# Patient Record
Sex: Female | Born: 1945 | Race: White | Hispanic: No | Marital: Married | Smoking: Former smoker
Health system: Southern US, Community
[De-identification: ages and names within clinical notes are randomized; demographics above are authoritative.]

## PROBLEM LIST (undated history)

## (undated) DIAGNOSIS — M130 Polyarthritis, unspecified: Secondary | ICD-10-CM

## (undated) DIAGNOSIS — I499 Cardiac arrhythmia, unspecified: Secondary | ICD-10-CM

## (undated) DIAGNOSIS — I4891 Unspecified atrial fibrillation: Secondary | ICD-10-CM

## (undated) DIAGNOSIS — J40 Bronchitis, not specified as acute or chronic: Secondary | ICD-10-CM

## (undated) DIAGNOSIS — D649 Anemia, unspecified: Secondary | ICD-10-CM

## (undated) DIAGNOSIS — I10 Essential (primary) hypertension: Secondary | ICD-10-CM

## (undated) DIAGNOSIS — R61 Generalized hyperhidrosis: Secondary | ICD-10-CM

## (undated) DIAGNOSIS — K759 Inflammatory liver disease, unspecified: Secondary | ICD-10-CM

## (undated) DIAGNOSIS — M353 Polymyalgia rheumatica: Secondary | ICD-10-CM

## (undated) DIAGNOSIS — R011 Cardiac murmur, unspecified: Secondary | ICD-10-CM

## (undated) DIAGNOSIS — R509 Fever, unspecified: Principal | ICD-10-CM

## (undated) DIAGNOSIS — E119 Type 2 diabetes mellitus without complications: Secondary | ICD-10-CM

## (undated) DIAGNOSIS — M3320 Polymyositis, organ involvement unspecified: Secondary | ICD-10-CM

## (undated) DIAGNOSIS — K219 Gastro-esophageal reflux disease without esophagitis: Secondary | ICD-10-CM

## (undated) DIAGNOSIS — E7800 Pure hypercholesterolemia, unspecified: Secondary | ICD-10-CM

## (undated) DIAGNOSIS — G4730 Sleep apnea, unspecified: Secondary | ICD-10-CM

## (undated) HISTORY — PX: ABDOMINAL HYSTERECTOMY: SHX81

## (undated) HISTORY — DX: Pure hypercholesterolemia, unspecified: E78.00

## (undated) HISTORY — DX: Inflammatory liver disease, unspecified: K75.9

## (undated) HISTORY — DX: Polymyositis, organ involvement unspecified: M33.20

## (undated) HISTORY — DX: Unspecified atrial fibrillation: I48.91

## (undated) HISTORY — DX: Essential (primary) hypertension: I10

## (undated) HISTORY — DX: Bronchitis, not specified as acute or chronic: J40

## (undated) HISTORY — DX: Generalized hyperhidrosis: R61

## (undated) HISTORY — DX: Polymyalgia rheumatica: M35.3

## (undated) HISTORY — PX: DG THUMB RIGHT HAND (ARMC HX): HXRAD1825

## (undated) HISTORY — DX: Fever, unspecified: R50.9

## (undated) HISTORY — DX: Polyarthritis, unspecified: M13.0

## (undated) HISTORY — DX: Type 2 diabetes mellitus without complications: E11.9

---

## 1970-09-23 HISTORY — PX: TUBAL LIGATION: SHX77

## 1971-09-24 HISTORY — PX: ABDOMINAL HYSTERECTOMY: SHX81

## 1971-09-24 HISTORY — PX: VAGINAL HYSTERECTOMY: SHX2639

## 1971-09-24 HISTORY — PX: APPENDECTOMY: SHX54

## 1999-09-24 HISTORY — PX: OVARY SURGERY: SHX727

## 1999-09-24 HISTORY — PX: OOPHORECTOMY: SHX86

## 2004-07-13 ENCOUNTER — Encounter: Admission: RE | Admit: 2004-07-13 | Discharge: 2004-07-13 | Payer: Self-pay | Admitting: Internal Medicine

## 2005-03-26 ENCOUNTER — Emergency Department (HOSPITAL_COMMUNITY): Admission: EM | Admit: 2005-03-26 | Discharge: 2005-03-26 | Payer: Self-pay | Admitting: Emergency Medicine

## 2005-07-22 ENCOUNTER — Encounter: Admission: RE | Admit: 2005-07-22 | Discharge: 2005-07-22 | Payer: Self-pay | Admitting: Internal Medicine

## 2005-09-23 HISTORY — PX: KNEE ARTHROPLASTY: SHX992

## 2005-09-23 HISTORY — PX: KNEE ARTHROSCOPY: SUR90

## 2006-04-24 ENCOUNTER — Ambulatory Visit (HOSPITAL_COMMUNITY): Admission: RE | Admit: 2006-04-24 | Discharge: 2006-04-24 | Payer: Self-pay | Admitting: Orthopedic Surgery

## 2006-05-28 ENCOUNTER — Ambulatory Visit (HOSPITAL_BASED_OUTPATIENT_CLINIC_OR_DEPARTMENT_OTHER): Admission: RE | Admit: 2006-05-28 | Discharge: 2006-05-28 | Payer: Self-pay | Admitting: Orthopedic Surgery

## 2006-07-24 ENCOUNTER — Encounter: Admission: RE | Admit: 2006-07-24 | Discharge: 2006-07-24 | Payer: Self-pay | Admitting: Internal Medicine

## 2007-08-26 ENCOUNTER — Encounter: Admission: RE | Admit: 2007-08-26 | Discharge: 2007-08-26 | Payer: Self-pay | Admitting: Internal Medicine

## 2007-09-14 ENCOUNTER — Ambulatory Visit (HOSPITAL_COMMUNITY): Admission: RE | Admit: 2007-09-14 | Discharge: 2007-09-14 | Payer: Self-pay | Admitting: Orthopedic Surgery

## 2007-09-24 HISTORY — PX: FOOT SURGERY: SHX648

## 2008-07-06 ENCOUNTER — Ambulatory Visit (HOSPITAL_BASED_OUTPATIENT_CLINIC_OR_DEPARTMENT_OTHER): Admission: RE | Admit: 2008-07-06 | Discharge: 2008-07-06 | Payer: Self-pay | Admitting: Orthopedic Surgery

## 2008-09-26 ENCOUNTER — Encounter: Admission: RE | Admit: 2008-09-26 | Discharge: 2008-09-26 | Payer: Self-pay | Admitting: Internal Medicine

## 2009-01-27 ENCOUNTER — Ambulatory Visit (HOSPITAL_COMMUNITY): Admission: RE | Admit: 2009-01-27 | Discharge: 2009-01-27 | Payer: Self-pay | Admitting: Gastroenterology

## 2009-10-16 ENCOUNTER — Encounter: Admission: RE | Admit: 2009-10-16 | Discharge: 2009-10-16 | Payer: Self-pay | Admitting: Internal Medicine

## 2009-11-13 DIAGNOSIS — K219 Gastro-esophageal reflux disease without esophagitis: Secondary | ICD-10-CM | POA: Insufficient documentation

## 2009-11-13 DIAGNOSIS — Z79899 Other long term (current) drug therapy: Secondary | ICD-10-CM | POA: Insufficient documentation

## 2009-11-13 DIAGNOSIS — E785 Hyperlipidemia, unspecified: Secondary | ICD-10-CM | POA: Insufficient documentation

## 2009-11-13 DIAGNOSIS — D126 Benign neoplasm of colon, unspecified: Secondary | ICD-10-CM | POA: Insufficient documentation

## 2009-12-13 DIAGNOSIS — M8580 Other specified disorders of bone density and structure, unspecified site: Secondary | ICD-10-CM | POA: Insufficient documentation

## 2010-07-10 DIAGNOSIS — T6391XA Toxic effect of contact with unspecified venomous animal, accidental (unintentional), initial encounter: Secondary | ICD-10-CM | POA: Insufficient documentation

## 2010-10-25 ENCOUNTER — Other Ambulatory Visit: Payer: Self-pay | Admitting: Internal Medicine

## 2010-10-25 DIAGNOSIS — Z1239 Encounter for other screening for malignant neoplasm of breast: Secondary | ICD-10-CM

## 2010-11-26 ENCOUNTER — Ambulatory Visit: Payer: Self-pay

## 2010-12-03 ENCOUNTER — Ambulatory Visit
Admission: RE | Admit: 2010-12-03 | Discharge: 2010-12-03 | Disposition: A | Discharge status: Home / Self Care | Payer: Medicare Other | Source: Ambulatory Visit | Attending: Internal Medicine | Admitting: Internal Medicine

## 2010-12-03 DIAGNOSIS — Z1239 Encounter for other screening for malignant neoplasm of breast: Secondary | ICD-10-CM

## 2010-12-17 DIAGNOSIS — N951 Menopausal and female climacteric states: Secondary | ICD-10-CM | POA: Insufficient documentation

## 2011-02-05 NOTE — Op Note (Signed)
[name], [name]              ACCOUNT NO.:  [id number]   MEDICAL RECORD NO.:  [id number]          PATIENT TYPE:  AMB   LOCATION:  ENDO                         FACILITY:  [facility]   PHYSICIAN:  Anselmo Rod, M.D.  DATE OF BIRTH:  1946/09/22   DATE OF PROCEDURE:  01/27/2009  DATE OF DISCHARGE:                               OPERATIVE REPORT   PROCEDURE PERFORMED:  Screening colonoscopy.   ENDOSCOPIST:  Anselmo Rod, M.D.   INSTRUMENT USED:  Pentax video colonoscope.   INDICATIONS FOR PROCEDURE:  A 65 year old white female undergoing a  screening colonoscopy.  The patient has a history of colonic polyps  removed in the past.   PREPROCEDURE PREPARATION:  Informed consent was procured from the  patient and the patient had fasted for 4 hours prior to the procedure  and prepped with MoviPrep the night of the procedure and the morning of  the procedure.  She was fasted for 4 hours prior to the procedure.  The  risks of the procedure including bleeding, perforation, 10% miss rate of  cancer and polyps, etc., were discussed with her in great detail.   PREPROCEDURE PHYSICAL:  Patient with stable vital signs.  Neck supple.  Chest clear to auscultation.  S1, S2 regular.  Abdomen soft with normal  bowel sounds.   DESCRIPTION OF PROCEDURE:  The patient was placed in the left lateral  decubitus position, sedated with 125 mcg of Fentanyl and 10 mf of Versed  given intravenously in slow incremental doses.  Once the patient was  adequately sedated and maintained on low-flow oxygen and continuous  cardiac monitoring, the Pentax video colonoscope was advanced from the  rectum to the cecum.  The appendiceal orifice and cecal valve were  carefully visualized, and photographed.  No masses, polyps, erosions,  ulcerations, or diverticula were noted.  Retroflexion in the rectum  showed no abnormality.  The terminal ileum appeared healthy and without  lesions.   IMPRESSION:  Normal colonoscopy  of the terminal ileum.  No masses,  polyps, erosions, ulcerations, or diverticula noted.   RECOMMENDATIONS:  1. Continue a high-fiber diet, vegetables and fruit.  2. Repeat colonoscopy in the next 5 years.  3. If the patient has abnormal symptoms in the interim, she is to      contact the office immediately for further recommendations.      Anselmo Rod, M.D.  Electronically Signed     JNM/MEDQ  D:  01/27/2009  T:  01/27/2009  Job:  [id number]   cc:   Larina Earthly, M.D.  Fax: [phone]

## 2011-02-05 NOTE — Op Note (Signed)
[name] [name], [name] [name]              ACCOUNT NO.:  [id number]   MEDICAL RECORD NO.:  [id number]          PATIENT TYPE:  AMB   LOCATION:  DSC                          FACILITY:  [facility]   PHYSICIAN:  Leonides Grills, M.D.     DATE OF BIRTH:  Dec 15, 1945   DATE OF PROCEDURE:  07/06/2008  DATE OF DISCHARGE:                               OPERATIVE REPORT   PREOPERATIVE DIAGNOSES:  1. Left second metatarsalgia.  2. Left second hammertoe.   POSTOPERATIVE DIAGNOSES:  1. Left second metatarsalgia.  2. Left second hammertoe.   OPERATION:  1. Left second Weil metatarsal shortening osteotomy.  2. Stress x-rays, left foot.  3. Left second toe metatarsophalangeal joint dorsal capsulotomy      collateral release.  4. Left second toe proximal phalanx head resection.  5. Left second toe flexor pollicis longus to proximal phalanx tendon      transfer.  6. Left second toe extensor digitorum brevis to extensor digitorum      longus tendon transfer.   ANESTHESIA:  General.   SURGEON:  Leonides Grills, MD   ASSISTANT:  Richardean Canal, PA-C   ESTIMATED BLOOD LOSS:  Minimal.   TOURNIQUET TIME:  Approximately 45 minutes.   COMPLICATIONS:  None.   DISPOSITION:  Stable to PR.   INDICATIONS:  This is a 65 year old female who has had longstanding left  forefoot pain that was interfering with her life due to the above  pathology.  She was consented to the above procedure.  All risks of  infection, neurovascular injury, nonunion, malunion, hardware rotation,  hardware failure, persistent pain, worsening pain, prolonged recovery,  cock-up toe deformity, and recurrence of hammertoe were all explained.  Questions were encouraged and answered.   OPERATION:  The patient was brought to the operating room and placed in  the supine position after adequate general endotracheal tube anesthesia  was administered as well as Ancef 1 g IV piggyback.  Left lower  extremity was then prepped and draped in the sterile  manner over  proximally placed thigh tourniquet.  Limb was gravity exsanguinated and  tourniquet was elevated to 290 mmHg.  A longitudinal incision over the  dorsal aspect of left second toe was then made.  Dissection was carried  down through the skin.  Hemostasis was obtained.  EDB and EDL tendons  were identified and EDL was tenotomized proximally and medially in the  brevis distal lateral and retracted of harm's way for later transfer.  An MTP joint dorsal capsulotomy with collateral release was then  performed with a 15 blade scalpel protecting the soft tissues both  medially and laterally.  This had an outstanding release.  The distal  aspect of the proximal phalanx was then skeletonized and the head was  then removed with rongeur followed by a bone cutter.  This cut was made  perpendicular to long axis of the proximal phalanx.  A longitudinal  incision was then made into the plantar plate of the PIP joint.  FPL  tendon was then identified and was tenotomized as distal as possible.  This was carefully dissected out through the  flexor digitorum brevis  tendons and a slit was made so that 2 separate limbs could be used in  the FPL to proximal phalanx tendon transfer around the shaft of the  proximal phalanx.  We then plantarflexed the second toe.  Then with  stacked sagittal saw blades, a Weil metatarsal area osteotomy was then  made parallel to the plantar aspect of the foot.  The head was shortened  about 3-4 mm and then fixed with a 1.5-mm fully threaded cortical set  screw using a 1.1-mm drill hole respectively.  This had excellent  purchase and maintenance of the correction.  This was countersunk.  Bone  ledge dorsally was trimmed off with a rongeur.  Bone wax applied to  expose bony surfaces after the joint was copiously with normal saline.  Once the bone wax was applied, this was copiously irrigated once again.  We then transferred holding the toe down the plantarflexed  position,  transferred the FPL to proximal phalanx and reconstructed this with 3-0  PDS stitch.  This had outstanding repair.  We then placed a 0.054 K-wire  antegrade through the middle and distal phalanx, reduced PIP joint and  fired this retrograde into the proximal phalanx.  With the toe held in  reduced position, we then fired this across the MTP joint.  This held  the toe in excellent position.  We then obtained stress x-ray in AP and  lateral planes, showed no gross motion, fixation of proper position, and  excellent alignment as well.  We had the toe reduced in the exact plane  of the metatarsal.  There was a subtle hallux interphalangeus and this  was not addressed before.  It was not symptomatic.  The EDB to EDL  tendon was then transferred using a 3-0 PDS stitch and this was sewn to  the stump of the FPL tendon as well, reconstructing the extensor  expansion unit.  The area was copiously irrigated with normal saline.  The tourniquet was deflated.  Hemostasis was obtained.  Toe pinked up  nicely.  Skin relieving incision was made on the either side of the K-  wire.  K-wire was bent, cut and capped.  The area was copiously  irrigated with normal saline.  Subcu was closed with 3-0 PDS.  Skin was  closed with 4-0 nylon.  Sterile dressing was applied.  Hard sole shoe  was applied.  The patient stable to PR.      Leonides Grills, M.D.  Electronically Signed     PB/MEDQ  D:  07/06/2008  T:  07/06/2008  Job:  [id number]

## 2011-02-08 NOTE — Op Note (Addendum)
NAMECLEMIE, GENERAL              ACCOUNT NO.:  [id number]   MEDICAL RECORD NO.:  [id number]          PATIENT TYPE:  AMB   LOCATION:  DSC                          FACILITY:  [facility]   PHYSICIAN:  Mila Homer. Sherlean Foot, M.D. DATE OF BIRTH:  1946-04-25   DATE OF PROCEDURE:  05/28/2006  DATE OF DISCHARGE:                                 OPERATIVE REPORT   SURGEON:  Lucey.   ASSISTANT:  None.   ANESTHESIA:  General plus preoperative knee block.   PREOPERATIVE DIAGNOSES:  1. Right knee osteoarthritis.  2. Medial meniscus tear.   POSTOPERATIVE DIAGNOSES:  1. Right knee osteoarthritis.  2. Medial and lateral meniscus tears.   INDICATIONS FOR PROCEDURE:  The patient is a 65 year old white female with  mechanical symptoms and MRI evidence of meniscus tearing, radiographic  evidence of some arthritis.  Informed consent was obtained.   DESCRIPTION OF PROCEDURE:  The patient was taken to the operating room,  administered general anesthesia.  Right leg was then prepped and draped in  the usual sterile fashion.  Inferolateral and inferomedial ports were  created with a #11 blade, blunt trocar and cannula after sterile prep and  drape.  Diagnostic arthroscopy of the knee revealed grade 3 chondromalacia  over much of the patella, grade 2 in the depth of the trochlea.  A  chondroplasty was performed on the patella.  Again, this was a very large  surface of the patella.  I then went into the medial compartment.  There was  minimal chondromalacia in the medial compartment.  However, she did have a  complex tear of the posterior horn of the medial meniscus.  A partial medial  meniscectomy was performed with a small straight-basket forceps and a great  white shaver.  I then went into the figure-four position and there was a  grade 3 chondromalacia over approximately half of the lateral tibial  plateau.  This was debrided as well.  There were several small radial tears  of the lateral meniscus.  I  debrided those with the straight-basket forceps  and the great white shaver.  I then lavaged the knee.  Closed with four  nylon sutures, dressed with xeroform dressing, ____ QA MARKER: 145 ____        sterile over an ace wrap.   COMPLICATIONS:  None.   DRAINS:  None.          ______________________________  Mila Homer. Sherlean Foot, M.D.    SDL/MEDQ  D:  05/28/2006  T:  05/28/2006  Job:  [id number]

## 2011-03-13 ENCOUNTER — Encounter: Payer: Self-pay | Admitting: *Deleted

## 2011-03-20 ENCOUNTER — Ambulatory Visit (INDEPENDENT_AMBULATORY_CARE_PROVIDER_SITE_OTHER): Payer: Medicare Other | Admitting: Cardiology

## 2011-03-20 ENCOUNTER — Encounter: Payer: Self-pay | Admitting: Cardiology

## 2011-03-20 VITALS — BP 142/86 | HR 60 | Ht 65.0 in | Wt 187.0 lb

## 2011-03-20 DIAGNOSIS — J4540 Moderate persistent asthma, uncomplicated: Secondary | ICD-10-CM | POA: Insufficient documentation

## 2011-03-20 DIAGNOSIS — I4891 Unspecified atrial fibrillation: Secondary | ICD-10-CM | POA: Insufficient documentation

## 2011-03-20 DIAGNOSIS — J4520 Mild intermittent asthma, uncomplicated: Secondary | ICD-10-CM | POA: Insufficient documentation

## 2011-03-20 DIAGNOSIS — E7800 Pure hypercholesterolemia, unspecified: Secondary | ICD-10-CM

## 2011-03-20 DIAGNOSIS — J45909 Unspecified asthma, uncomplicated: Secondary | ICD-10-CM

## 2011-03-20 NOTE — Patient Instructions (Signed)
Continue current medical therapy.  I will see you again in 1 year.  Consider pulmonary evaluation for your asthma.

## 2011-03-20 NOTE — Assessment & Plan Note (Signed)
She is more symptomatic now. She would like follow up with pulmonary and she will call and make that appointment. We did discuss the fact that sotalol dose of beta blocker properties but she has been on sotalol for many years and I don't think this is the cause of her increased symptoms recently.

## 2011-03-20 NOTE — Progress Notes (Signed)
[name] [name] Date of Birth: 07-26-1946   History of Present Illness: [name] [name] is seen for yearly followup. She has a history of atrial fibrillation that has been well controlled with sotalol since 2003. She states she's had no recurrent symptoms of arrhythmia. She has had a bout of bronchitis 2 months ago and finds that she has had increased asthma symptoms since then. She was treated at that time with antibiotics and inhaler therapy. Even prior to this episode she was noticing more exercise-induced asthma. She is still working 2 days a week. She has been more sedentary and has a result has gained 11 pounds. She states her mother passed away this year and it has been a difficult time for her period  Current Outpatient Prescriptions on File Prior to Visit  Medication Sig Dispense Refill  . aspirin 81 MG tablet Take 81 mg by mouth daily.        . B Complex Vitamins (B COMPLEX-B12) TABS Take 1 tablet by mouth daily.        Marland Kitchen CALCIUM-VITAMIN D PO Take 1 tablet by mouth.       . Cetirizine HCl (ZYRTEC PO) Take 1 tablet by mouth as needed.        . Cholecalciferol (VITAMIN D3) 400 UNITS CAPS Take 1 capsule by mouth daily.        . Coenzyme Q10 (COQ10 PO) Take 1 tablet by mouth daily.        . Misc Natural Products (OSTEO BI-FLEX ADV JOINT SHIELD PO) Take 2 tablets by mouth daily.        . multivitamin (THERAGRAN) per tablet Take 1 tablet by mouth daily.        Marland Kitchen omega-3 acid ethyl esters (LOVAZA) 1 G capsule Take 2 g by mouth 2 (two) times daily.        . pantoprazole (PROTONIX) 40 MG tablet Take 40 mg by mouth daily.        Marland Kitchen PROAIR HFA 108 (90 BASE) MCG/ACT inhaler Inhale into the lungs as directed.      . simvastatin (ZOCOR) 20 MG tablet Take 20 mg by mouth at bedtime.        . sotalol (BETAPACE) 80 MG tablet Take 80 mg by mouth 2 (two) times daily.        Marland Kitchen venlafaxine (EFFEXOR-XR) 75 MG 24 hr capsule Take 1 tablet by mouth Daily.      . vitamin C (ASCORBIC ACID) 500 MG tablet Take 500 mg by  mouth daily.        Marland Kitchen zinc gluconate 50 MG tablet Take 50 mg by mouth daily.        Marland Kitchen DISCONTD: estrogens, conjugated, (PREMARIN) 0.45 MG tablet Take 0.45 mg by mouth daily. Take daily for 21 days then do not take for 7 days.         Allergies  Allergen Reactions  . Cephalosporins   . Nickel     Past Medical History  Diagnosis Date  . Atrial fibrillation   . Hypercholesterolemia   . Asthma 1976  . Hepatitis   . Bronchitis     Past Surgical History  Procedure Date  . Vaginal hysterectomy 1973  . Ovary surgery 2001  . Knee arthroplasty 2007    right    History  Smoking status  . Former Smoker -- 0.5 packs/day for 10 years  . Types: Cigarettes  . Quit date: 09/24/1979  Smokeless tobacco  . Never Used    History  Alcohol Use: Not on file    Family History  Problem Relation Age of Onset  . Hypertension Sister   . Multiple sclerosis Daughter   . Lung cancer Father   . Hypertension Mother     Review of Systems: As noted in history of present illness.  All other systems were reviewed and are negative.  Physical Exam: BP 142/86  Pulse 60  Ht 5\' 5"  (1.651 m)  Wt 187 lb (84.823 kg)  BMI 31.12 kg/m2 She is a pleasant overweight white female in no acute distress. Her HEENT exam is unremarkable. She has no JVD or bruits. Lungs are clear. Cardiac exam reveals a regular rate and rhythm without gallop, murmur, or click. She has no edema. Pedal pulses are good. LABORATORY DATA: ECG demonstrates sinus bradycardia with a rate of 50 beats per minute. There is voltage criteria for LVH. Her QTC is 408 ms.  Assessment / Plan:

## 2011-03-20 NOTE — Assessment & Plan Note (Signed)
Well controlled on sotalol therapy. We will continue with the same. She has been intolerant of verapamil in the past.

## 2011-03-21 ENCOUNTER — Encounter: Payer: Self-pay | Admitting: Cardiology

## 2011-04-22 ENCOUNTER — Other Ambulatory Visit: Payer: Medicare Other

## 2011-04-22 ENCOUNTER — Ambulatory Visit (INDEPENDENT_AMBULATORY_CARE_PROVIDER_SITE_OTHER): Payer: Medicare Other | Admitting: Critical Care Medicine

## 2011-04-22 ENCOUNTER — Encounter: Payer: Self-pay | Admitting: Critical Care Medicine

## 2011-04-22 DIAGNOSIS — J45909 Unspecified asthma, uncomplicated: Secondary | ICD-10-CM

## 2011-04-22 MED ORDER — PREDNISONE 10 MG PO TABS: ORAL_TABLET | ORAL | Status: DC

## 2011-04-22 MED ORDER — BECLOMETHASONE DIPROPIONATE 40 MCG/ACT IN AERS: 2.0000 | INHALATION_SPRAY | Freq: Two times a day (BID) | RESPIRATORY_TRACT | Status: DC

## 2011-04-22 NOTE — Patient Instructions (Signed)
Start Qvar two puff twice daily Prednisone 10mg  Take 4 for two days, three for two days, two for two days, then one for two days and stop USe Proair as needed Allergy testing will be obtained(blood work) Return 2 months

## 2011-04-22 NOTE — Progress Notes (Signed)
Subjective:    Patient ID: [name] [name], female    DOB: [birth year], 65 y.o.   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  rx for 6months and got better.  Then went to [place] in Jan and had difficulty.  Trouble with steps/hills Then two months ago saw [facility] for bronchitis and rx zpak/codeine/proair.  Stayed on zyrtec.  In 2002 had PAF,  Did not tolerate BB. And Rx Sotolol. Now not in Afib.  After bronchtiis still coughing.  Rx another zpak and use inhaler q6H.   Now DOE going from bldg to bldg.  HAs episodic cough and feels tight in the chest .  HArd to take a deep breath.   Cough is dry.   Notes qhs dypsnea and cough  Past Medical History  Diagnosis Date  . Atrial fibrillation   . Hypercholesterolemia   . Asthma 1976  . Hepatitis 1980s  . Bronchitis      Family History  Problem Relation Age of Onset  . Hypertension Sister   . Multiple sclerosis Daughter   . Lung cancer Father   . Hypertension Mother   . Breast cancer      maternal aunt  . Allergies Daughter   . Allergies Daughter   . Allergies Son      History   Social History  . Marital Status: Married    Spouse Name: N/A    Number of Children: 3  . Years of Education: N/A   Occupational History  . RN    Social History Main Topics  . Smoking status: Former Smoker -- 0.2 packs/day for 10 years    Types: Cigarettes    Quit date: 09/23/1980  . Smokeless tobacco: Never Used  . Alcohol Use: Yes     1 glass 5-6 times weekly  . Drug Use: No  . Sexually Active: Not on file   Other Topics Concern  . Not on file   Social History Narrative  . No narrative on file     Allergies  Allergen Reactions  . Cephalosporins   . Nickel   . Sulfa Antibiotics     hives     Outpatient Prescriptions Prior to Visit  Medication Sig Dispense Refill  . aspirin 81 MG tablet Take 81 mg by mouth daily.        . B Complex Vitamins (B COMPLEX-B12) TABS Take 1 tablet by mouth daily.        Marland Kitchen CALCIUM-VITAMIN D  PO Take 1 tablet by mouth.       . Cetirizine HCl (ZYRTEC PO) Take 1 tablet by mouth daily.       . Cholecalciferol (VITAMIN D3) 2000 UNITS TABS Take 1 tablet by mouth daily.        . Cholecalciferol (VITAMIN D3) 400 UNITS CAPS Take 1 capsule by mouth daily.        . Coenzyme Q10 (COQ10 PO) Take 1 tablet by mouth daily.        . Misc Natural Products (OSTEO BI-FLEX ADV JOINT SHIELD PO) Take 2 tablets by mouth daily.        . multivitamin (THERAGRAN) per tablet Take 1 tablet by mouth daily.        Marland Kitchen omega-3 acid ethyl esters (LOVAZA) 1 G capsule Take 2 g by mouth 2 (two) times daily.        . pantoprazole (PROTONIX) 40 MG tablet Take 40 mg by mouth daily.        Marland Kitchen  PROAIR HFA 108 (90 BASE) MCG/ACT inhaler Inhale 2 puffs into the lungs every 6 (six) hours as needed.       . simvastatin (ZOCOR) 20 MG tablet Take 20 mg by mouth at bedtime.        . sotalol (BETAPACE) 80 MG tablet Take 80 mg by mouth 2 (two) times daily.        Marland Kitchen venlafaxine (EFFEXOR-XR) 75 MG 24 hr capsule Take 1 tablet by mouth Daily.      . vitamin C (ASCORBIC ACID) 500 MG tablet Take 500 mg by mouth daily.        Marland Kitchen zinc gluconate 50 MG tablet Take 50 mg by mouth daily.            Review of Systems  Constitutional: Negative for fever, chills, diaphoresis, activity change, appetite change, fatigue and unexpected weight change.  HENT: Positive for congestion, rhinorrhea, sneezing and postnasal drip. Negative for hearing loss, ear pain, nosebleeds, sore throat, facial swelling, mouth sores, trouble swallowing, neck pain, neck stiffness, dental problem, voice change, sinus pressure, tinnitus and ear discharge.   Eyes: Positive for itching. Negative for photophobia, discharge and visual disturbance.  Respiratory: Positive for cough, shortness of breath and wheezing. Negative for apnea, choking, chest tightness and stridor.   Cardiovascular: Positive for palpitations. Negative for chest pain and leg swelling.  Gastrointestinal:  Negative for nausea, vomiting, abdominal pain, constipation, blood in stool and abdominal distention.  Genitourinary: Negative for dysuria, urgency, frequency, hematuria, flank pain, decreased urine volume and difficulty urinating.  Musculoskeletal: Negative for myalgias, back pain, joint swelling, arthralgias and gait problem.  Skin: Negative for color change, pallor and rash.  Neurological: Negative for dizziness, tremors, seizures, syncope, speech difficulty, weakness, light-headedness, numbness and headaches.  Hematological: Negative for adenopathy. Does not bruise/bleed easily.  Psychiatric/Behavioral: Negative for confusion, sleep disturbance and agitation. The patient is not nervous/anxious.        Objective:   Physical Exam  Filed Vitals:   04/22/11 1520  BP: 132/80  Pulse: 55  Temp: 98.3 F (36.8 C)  TempSrc: Oral  Height: 5\' 4"  (1.626 m)  Weight: 187 lb 9.6 oz (85.095 kg)  SpO2: 96%    Gen: Pleasant, well-nourished, in no distress,  normal affect  ENT: No lesions,  mouth clear,  oropharynx clear, no postnasal drip  Neck: No JVD, no TMG, no carotid bruits  Lungs: No use of accessory muscles, no dullness to percussion, clear without rales or rhonchi  Cardiovascular: RRR, heart sounds normal, no murmur or gallops, no peripheral edema  Abdomen: soft and NT, no HSM,  BS normal  Musculoskeletal: No deformities, no cyanosis or clubbing  Neuro: alert, non focal  Skin: Warm, no lesions or rashes  CXR: No active dz  Spiro 7/30: Fef 2575% 79%    Assessment & Plan:   Asthma Moderate persistent asthma with mild flare Plan Start Qvar two puff twice daily Prednisone 10mg  Take 4 for two days, three for two days, two for two days, then one for two days and stop USe Proair as needed Allergy testing will be obtained(blood work) Return 2 months

## 2011-04-23 LAB — ALLERGY FULL PROFILE
Allergen, D pternoyssinus,d7: <0.1 kU/L (ref <0.35)
Allergen,Goose feathers, e70: <0.1 kU/L (ref <0.35)
Alternaria Alternata: <0.1 kU/L (ref <0.35)
Aspergillus fumigatus, IgG: <0.1 kU/L (ref <0.35)
Bahia Grass: <0.1 kU/L (ref <0.35)
Bermuda Grass: <0.1 kU/L (ref <0.35)
Box Elder IgE: <0.1 kU/L (ref <0.35)
Candida Albicans: <0.1 kU/L (ref <0.35)
Cat Dander: <0.1 kU/L (ref <0.35)
Common Ragweed: <0.1 kU/L (ref <0.35)
Curvularia lunata: <0.1 kU/L (ref <0.35)
D. farinae: <0.1 kU/L (ref <0.35)
Dog Dander: <0.1 kU/L (ref <0.35)
Elm IgE: <0.1 kU/L (ref <0.35)
Fescue: <0.1 kU/L (ref <0.35)
G005 Rye, Perennial: <0.1 kU/L (ref <0.35)
G009 Red Top: <0.1 kU/L (ref <0.35)
Goldenrod: <0.1 kU/L (ref <0.35)
Helminthosporium halodes: <0.1 kU/L (ref <0.35)
House Dust Hollister: <0.1 kU/L (ref <0.35)
IgE (Immunoglobulin E), Serum: 6.1 IU/mL (ref 0.0–180.0)
Lamb's Quarters: <0.1 kU/L (ref <0.35)
Oak: <0.1 kU/L (ref <0.35)
Plantain: <0.1 kU/L (ref <0.35)
Stemphylium Botryosum: <0.1 kU/L (ref <0.35)
Sycamore Tree: <0.1 kU/L (ref <0.35)
Timothy Grass: <0.1 kU/L (ref <0.35)

## 2011-04-24 NOTE — Assessment & Plan Note (Addendum)
Moderate persistent asthma with mild flare Plan Start Qvar two puff twice daily Prednisone 10mg  Take 4 for two days, three for two days, two for two days, then one for two days and stop USe Proair as needed Allergy testing will be obtained(blood work) Return 2 months

## 2011-06-19 ENCOUNTER — Encounter: Payer: Self-pay | Admitting: Critical Care Medicine

## 2011-06-19 ENCOUNTER — Ambulatory Visit (INDEPENDENT_AMBULATORY_CARE_PROVIDER_SITE_OTHER): Payer: Medicare Other | Admitting: Critical Care Medicine

## 2011-06-19 DIAGNOSIS — J45909 Unspecified asthma, uncomplicated: Secondary | ICD-10-CM

## 2011-06-19 NOTE — Progress Notes (Signed)
Subjective:    Patient ID: [name] [name], female    DOB: [birth year], 65 y.o.   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  rx for 6months and got better.  Then went to [place] in Jan and had difficulty.  Trouble with steps/hills Then two months ago saw [facility] for bronchitis and rx zpak/codeine/proair.  Stayed on zyrtec.  In 2002 had PAF,  Did not tolerate BB. And Rx Sotolol. Now not in Afib.  After bronchtiis still coughing.  Rx another zpak and use inhaler q6H.   Now DOE going from bldg to bldg.  HAs episodic cough and feels tight in the chest .  HArd to take a deep breath.   Cough is dry.   Notes qhs dypsnea and cough   06/19/2011 Overall the patient is doing well without active issues Pt denies any significant sore throat, nasal congestion or excess secretions, fever, chills, sweats, unintended weight loss, pleurtic or exertional chest pain, orthopnea PND, or leg swelling Pt denies any increase in rescue therapy over baseline, denies waking up needing it or having any early am or nocturnal exacerbations of coughing/wheezing/or dyspnea. Pt also denies any obvious fluctuation in symptoms with  weather or environmental change or other alleviating or aggravating factors  Asthma History: Symptoms 0-2 days/week Nighttime Awakenings 0-2/month Asthma interference with normal activity No limitations SABA use (not for EIB) 0-2 days/wk Risk: Exacerbations requiring oral systemic steroids 0-1 / year  Past Medical History  Diagnosis Date  . Atrial fibrillation   . Hypercholesterolemia   . Asthma 1976  . Hepatitis 1980s  . Bronchitis      Family History  Problem Relation Age of Onset  . Hypertension Sister   . Multiple sclerosis Daughter   . Lung cancer Father   . Hypertension Mother   . Breast cancer      maternal aunt  . Allergies Daughter   . Allergies Daughter   . Allergies Son      History   Social History  . Marital Status: Married    Spouse Name: N/A   Number of Children: 3  . Years of Education: N/A   Occupational History  . RN    Social History Main Topics  . Smoking status: Former Smoker -- 0.2 packs/day for 10 years    Types: Cigarettes    Quit date: 09/23/1980  . Smokeless tobacco: Never Used  . Alcohol Use: Yes     1 glass 5-6 times weekly  . Drug Use: No  . Sexually Active: Not on file   Other Topics Concern  . Not on file   Social History Narrative  . No narrative on file     Allergies  Allergen Reactions  . Cephalosporins   . Nickel   . Sulfa Antibiotics     hives     Outpatient Prescriptions Prior to Visit  Medication Sig Dispense Refill  . aspirin 81 MG tablet Take 81 mg by mouth daily.        . B Complex Vitamins (B COMPLEX-B12) TABS Take 1 tablet by mouth daily.        . beclomethasone (QVAR) 40 MCG/ACT inhaler Inhale 2 puffs into the lungs 2 (two) times daily.  1 Inhaler  6  . CALCIUM-VITAMIN D PO Take 1 tablet by mouth.       . Cetirizine HCl (ZYRTEC PO) Take 1 tablet by mouth daily.       . Cholecalciferol (VITAMIN  D3) 2000 UNITS TABS Take 1 tablet by mouth daily.        . Cholecalciferol (VITAMIN D3) 400 UNITS CAPS Take 1 capsule by mouth daily.        . clobetasol (TEMOVATE) 0.05 % ointment As needed      . Coenzyme Q10 (COQ10 PO) Take 1 tablet by mouth daily.        . Misc Natural Products (OSTEO BI-FLEX ADV JOINT SHIELD PO) Take 2 tablets by mouth daily.        . multivitamin (THERAGRAN) per tablet Take 1 tablet by mouth daily.        Marland Kitchen omega-3 acid ethyl esters (LOVAZA) 1 G capsule Take 2 g by mouth 2 (two) times daily.        . pantoprazole (PROTONIX) 40 MG tablet Take 40 mg by mouth daily.        Marland Kitchen PROAIR HFA 108 (90 BASE) MCG/ACT inhaler Inhale 2 puffs into the lungs every 6 (six) hours as needed.       . simvastatin (ZOCOR) 20 MG tablet Take 20 mg by mouth at bedtime.        . sotalol (BETAPACE) 80 MG tablet Take 80 mg by mouth 2 (two) times daily.        Marland Kitchen venlafaxine (EFFEXOR-XR) 75 MG 24  hr capsule Take 1 tablet by mouth Daily.      . vitamin C (ASCORBIC ACID) 500 MG tablet Take 500 mg by mouth daily.        Marland Kitchen zinc gluconate 50 MG tablet Take 50 mg by mouth daily.        . predniSONE (DELTASONE) 10 MG tablet Take 4 for two days, three for two days, two for two days, then one for two days and stop   20 tablet  0      Review of Systems  Constitutional: Negative for fever, chills, diaphoresis, activity change, appetite change, fatigue and unexpected weight change.  HENT: Positive for congestion, rhinorrhea, sneezing and postnasal drip. Negative for hearing loss, ear pain, nosebleeds, sore throat, facial swelling, mouth sores, trouble swallowing, neck pain, neck stiffness, dental problem, voice change, sinus pressure, tinnitus and ear discharge.   Eyes: Positive for itching. Negative for photophobia, discharge and visual disturbance.  Respiratory: Positive for cough, shortness of breath and wheezing. Negative for apnea, choking, chest tightness and stridor.   Cardiovascular: Positive for palpitations. Negative for chest pain and leg swelling.  Gastrointestinal: Negative for nausea, vomiting, abdominal pain, constipation, blood in stool and abdominal distention.  Genitourinary: Negative for dysuria, urgency, frequency, hematuria, flank pain, decreased urine volume and difficulty urinating.  Musculoskeletal: Negative for myalgias, back pain, joint swelling, arthralgias and gait problem.  Skin: Negative for color change, pallor and rash.  Neurological: Negative for dizziness, tremors, seizures, syncope, speech difficulty, weakness, light-headedness, numbness and headaches.  Hematological: Negative for adenopathy. Does not bruise/bleed easily.  Psychiatric/Behavioral: Negative for confusion, sleep disturbance and agitation. The patient is not nervous/anxious.        Objective:   Physical Exam   Filed Vitals:   06/19/11 1104  BP: 138/78  Pulse: 55  Temp: 98.1 F (36.7 C)    TempSrc: Oral  Height: 5\' 5"  (1.651 m)  Weight: 190 lb 12.8 oz (86.546 kg)  SpO2: 97%    Gen: Pleasant, well-nourished, in no distress,  normal affect  ENT: No lesions,  mouth clear,  oropharynx clear, no postnasal drip  Neck: No JVD, no TMG, no carotid bruits  Lungs: No use of accessory muscles, no dullness to percussion, clear without rales or rhonchi  Cardiovascular: RRR, heart sounds normal, no murmur or gallops, no peripheral edema  Abdomen: soft and NT, no HSM,  BS normal  Musculoskeletal: No deformities, no cyanosis or clubbing  Neuro: alert, non focal  Skin: Warm, no lesions or rashes  CXR: No active dz  Spiro 7/30: Fef 2575% 79%    Assessment & Plan:   Asthma Moderate persistent asthma now improved Plan Maintain inhaled steroids as prescribed Note allergy profile was negative

## 2011-06-19 NOTE — Assessment & Plan Note (Signed)
Moderate persistent asthma now improved Plan Maintain inhaled steroids as prescribed Note allergy profile was negative

## 2011-06-19 NOTE — Patient Instructions (Signed)
No change in medications. Return in        6 months        

## 2011-06-24 LAB — POCT I-STAT, CHEM 8
BUN: 22
Calcium, Ion: 1.14
Chloride: 108
Creatinine, Ser: 1
Glucose, Bld: 109 — ABNORMAL HIGH
HCT: 39
Hemoglobin: 13.3
Potassium: 3.9
Sodium: 139
TCO2: 25

## 2011-06-28 LAB — CREATININE, SERUM
Creatinine, Ser: 0.86
GFR calc Af Amer: >60
GFR calc non Af Amer: >60

## 2011-09-25 DIAGNOSIS — D179 Benign lipomatous neoplasm, unspecified: Secondary | ICD-10-CM | POA: Insufficient documentation

## 2011-11-08 ENCOUNTER — Other Ambulatory Visit: Payer: Self-pay | Admitting: Internal Medicine

## 2011-11-08 DIAGNOSIS — Z1231 Encounter for screening mammogram for malignant neoplasm of breast: Secondary | ICD-10-CM

## 2011-12-04 ENCOUNTER — Ambulatory Visit (INDEPENDENT_AMBULATORY_CARE_PROVIDER_SITE_OTHER): Payer: Medicare Other | Admitting: Adult Health

## 2011-12-04 ENCOUNTER — Ambulatory Visit: Payer: Medicare Other

## 2011-12-04 ENCOUNTER — Encounter: Payer: Self-pay | Admitting: Adult Health

## 2011-12-04 DIAGNOSIS — J45909 Unspecified asthma, uncomplicated: Secondary | ICD-10-CM

## 2011-12-04 MED ORDER — AZITHROMYCIN 250 MG PO TABS: ORAL_TABLET | ORAL | Status: AC

## 2011-12-04 NOTE — Progress Notes (Signed)
  Subjective:    Patient ID: [name] [name], female    DOB: 1946-01-13, 66 y.o.   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  rx for 6months and got better.  Then went to [place] in Jan and had difficulty.  Trouble with steps/hills Then two months ago saw [facility] for bronchitis and rx zpak/codeine/proair.  Stayed on zyrtec.  In 2002 had PAF,  Did not tolerate BB. And Rx Sotolol. Now not in Afib.  After bronchtiis still coughing.  Rx another zpak and use inhaler q6H.   Now DOE going from bldg to bldg.  HAs episodic cough and feels tight in the chest .  HArd to take a deep breath.   Cough is dry.   Notes qhs dypsnea and cough   06/19/2011 Overall the patient is doing well without active issues   12/04/2011 Acute OV  Complains of sinus congestion with yellow drainage, PND, hoarseness, prod cough with brown mucus, wheezing x 5 days. OTC not working. Increased QVAR Twice daily  5 days ago.  No hemoptysis or chest pain . No edema.     ROS:  Constitutional:   No  weight loss, night sweats,  Fevers, chills, fatigue, or  lassitude.  HEENT:   No headaches,  Difficulty swallowing,  Tooth/dental problems, or  Sore throat,                No sneezing, itching, ear ache,  +nasal congestion, post nasal drip,   CV:  No chest pain,  Orthopnea, PND, swelling in lower extremities, anasarca, dizziness, palpitations, syncope.   GI  No heartburn, indigestion, abdominal pain, nausea, vomiting, diarrhea, change in bowel habits, loss of appetite, bloody stools.   Resp:   No coughing up of blood.     No chest wall deformity  Skin: no rash or lesions.  GU: no dysuria, change in color of urine, no urgency or frequency.  No flank pain, no hematuria   MS:  No joint pain or swelling.  No decreased range of motion.  No back pain.  Psych:  No change in mood or affect. No depression or anxiety.  No memory loss.          Objective:   Physical Exam     Gen: Pleasant, well-nourished, in no  distress,  normal affect  ENT: No lesions,  mouth clear,  oropharynx clear, no postnasal drip  Neck: No JVD, no TMG, no carotid bruits  Lungs: No use of accessory muscles, no dullness to percussion, coarse BS   Cardiovascular: RRR, heart sounds normal, no murmur or gallops, no peripheral edema  Abdomen: soft and NT, no HSM,  BS normal  Musculoskeletal: No deformities, no cyanosis or clubbing  Neuro: alert, non focal  Skin: Warm, no lesions or rashes  CXR: No active dz  Spiro 7/30: Fef 2575% 79%    Assessment & Plan:

## 2011-12-04 NOTE — Patient Instructions (Signed)
Zpack take as directed.  Mucinex DM Twice daily  As needed  Cough/congestion  Saline nasal rinse As needed   Please contact office for sooner follow up if symptoms do not improve or worsen or seek emergency care  follow up Dr. Delford Field  As planned and As needed

## 2011-12-10 NOTE — Assessment & Plan Note (Signed)
Mild flare with URI   Plan Zpack take as directed.  Mucinex DM Twice daily  As needed  Cough/congestion  Saline nasal rinse As needed   Please contact office for sooner follow up if symptoms do not improve or worsen or seek emergency care  follow up Dr. Delford Field  As planned and As needed

## 2011-12-17 ENCOUNTER — Encounter: Payer: Self-pay | Admitting: Critical Care Medicine

## 2011-12-17 ENCOUNTER — Ambulatory Visit (INDEPENDENT_AMBULATORY_CARE_PROVIDER_SITE_OTHER): Payer: Medicare Other | Admitting: Critical Care Medicine

## 2011-12-17 ENCOUNTER — Ambulatory Visit
Admission: RE | Admit: 2011-12-17 | Discharge: 2011-12-17 | Disposition: A | Discharge status: Home / Self Care | Payer: Medicare Other | Source: Ambulatory Visit | Attending: Internal Medicine | Admitting: Internal Medicine

## 2011-12-17 VITALS — BP 142/78 | HR 55 | Temp 97.7°F | Ht 63.0 in | Wt 188.2 lb

## 2011-12-17 DIAGNOSIS — Z1231 Encounter for screening mammogram for malignant neoplasm of breast: Secondary | ICD-10-CM

## 2011-12-17 DIAGNOSIS — J0100 Acute maxillary sinusitis, unspecified: Secondary | ICD-10-CM | POA: Insufficient documentation

## 2011-12-17 MED ORDER — DM-GUAIFENESIN ER 30-600 MG PO TB12: ORAL_TABLET | ORAL | Status: DC

## 2011-12-17 MED ORDER — LEVOFLOXACIN 500 MG PO TABS: 500.0000 mg | ORAL_TABLET | Freq: Every day | ORAL | Status: AC

## 2011-12-17 MED ORDER — OMEGA-3-ACID ETHYL ESTERS 1 G PO CAPS: ORAL_CAPSULE | ORAL | Status: DC

## 2011-12-17 MED ORDER — FLUTICASONE PROPIONATE 50 MCG/ACT NA SUSP: 2.0000 | Freq: Every day | NASAL | Status: DC

## 2011-12-17 NOTE — Progress Notes (Signed)
Subjective:    Patient ID: [name] [name], female    DOB: May 10, 1946, 66 y.o.   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  rx for 6months and got better.  Then went to [place] in Jan and had difficulty.  Trouble with steps/hills Then two months ago saw [facility] for bronchitis and rx zpak/codeine/proair.  Stayed on zyrtec.  In 2002 had PAF,  Did not tolerate BB. And Rx Sotolol. Now not in Afib.  After bronchtiis still coughing.  Rx another zpak and use inhaler q6H.   Now DOE going from bldg to bldg.  HAs episodic cough and feels tight in the chest .  HArd to take a deep breath.   Cough is dry.   Notes qhs dypsnea and cough   06/19/2011 Overall the patient is doing well without active issues   12/04/2011 Acute OV  Complains of sinus congestion with yellow drainage, PND, hoarseness, prod cough with brown mucus, wheezing x 5 days. OTC not working. Increased QVAR Twice daily  5 days ago.  No hemoptysis or chest pain . No edema.    12/17/2011 Saw NP and Rx Zpak.  Pt still coughs and is yellow green.  Pt notes pndrip.  Cough never goes away. Notes some pndrip and sinus pressure.  Clears throat a lot.  No wheezing No change in medications. No SaBA use.    Past Medical History  Diagnosis Date  . Atrial fibrillation   . Hypercholesterolemia   . Asthma 1976  . Hepatitis 1980s  . Bronchitis      Family History  Problem Relation Age of Onset  . Hypertension Sister   . Multiple sclerosis Daughter   . Lung cancer Father   . Hypertension Mother   . Breast cancer      maternal aunt  . Allergies Daughter   . Allergies Daughter   . Allergies Son      History   Social History  . Marital Status: Married    Spouse Name: N/A    Number of Children: 3  . Years of Education: N/A   Occupational History  . RN    Social History Main Topics  . Smoking status: Former Smoker -- 0.2 packs/day for 5 years    Types: Cigarettes    Quit date: 09/23/1980  . Smokeless tobacco: Never  Used  . Alcohol Use: Yes     1 glass 5-6 times weekly  . Drug Use: No  . Sexually Active: Not on file   Other Topics Concern  . Not on file   Social History Narrative  . No narrative on file     Allergies  Allergen Reactions  . Cephalosporins   . Nickel   . Sulfa Antibiotics     hives     Outpatient Prescriptions Prior to Visit  Medication Sig Dispense Refill  . aspirin 81 MG tablet Take 81 mg by mouth daily.        . Cetirizine HCl (ZYRTEC PO) Take 1 tablet by mouth daily.       . clobetasol (TEMOVATE) 0.05 % ointment As needed      . multivitamin (THERAGRAN) per tablet Take 1 tablet by mouth daily.        . multivitamin-lutein (OCUVITE-LUTEIN) CAPS Take 1 capsule by mouth daily.      . pantoprazole (PROTONIX) 40 MG tablet Take 40 mg by mouth daily as needed.       Marland Kitchen PROAIR HFA 108 (  90 BASE) MCG/ACT inhaler Inhale 2 puffs into the lungs every 6 (six) hours as needed.       . simvastatin (ZOCOR) 20 MG tablet Take 20 mg by mouth at bedtime.        . sotalol (BETAPACE) 80 MG tablet Take 80 mg by mouth 2 (two) times daily.        Marland Kitchen venlafaxine (EFFEXOR-XR) 75 MG 24 hr capsule Take 1 tablet by mouth Daily.      . beclomethasone (QVAR) 40 MCG/ACT inhaler Inhale 2 puffs into the lungs 2 (two) times daily.  1 Inhaler  6  . omega-3 acid ethyl esters (LOVAZA) 1 G capsule Take 2 g by mouth 2 (two) times daily.        . B Complex Vitamins (B COMPLEX-B12) TABS Take 1 tablet by mouth daily.        Marland Kitchen CALCIUM-VITAMIN D PO Take 1 tablet by mouth.       . Cholecalciferol (VITAMIN D3) 2000 UNITS TABS Take 1 tablet by mouth daily.        . Cholecalciferol (VITAMIN D3) 400 UNITS CAPS Take 1 capsule by mouth daily.        . Coenzyme Q10 (COQ10 PO) Take 1 tablet by mouth daily.        . Misc Natural Products (OSTEO BI-FLEX ADV JOINT SHIELD PO) Take 2 tablets by mouth daily.        . Nutritional Supplements (WELLNESS ESSENTIALS FOR WOMEN PO) Take 1 capsule by mouth daily.      . vitamin C  (ASCORBIC ACID) 500 MG tablet Take 500 mg by mouth daily.        Marland Kitchen zinc gluconate 50 MG tablet Take 50 mg by mouth daily.           Review of Systems Constitutional:   No  weight loss, night sweats,  Fevers, chills, fatigue, lassitude. HEENT:   No headaches,  Difficulty swallowing,  Tooth/dental problems,  Sore throat,                No sneezing, itching, ear ache, +++nasal congestion, +++post nasal drip,   CV:  No chest pain,  Orthopnea, PND, swelling in lower extremities, anasarca, dizziness, palpitations  GI  No heartburn, indigestion, abdominal pain, nausea, vomiting, diarrhea, change in bowel habits, loss of appetite  Resp: Notes  shortness of breath with exertion not  at rest.  Notes  excess mucus, no productive cough,  No non-productive cough,  No coughing up of blood.  Notes  change in color of mucus.  No wheezing.  No chest wall deformity  Skin: no rash or lesions.  GU: no dysuria, change in color of urine, no urgency or frequency.  No flank pain.  MS:  No joint pain or swelling.  No decreased range of motion.  No back pain.  Psych:  No change in mood or affect. No depression or anxiety.  No memory loss.     Objective:   Physical Exam  Filed Vitals:   12/17/11 0912  BP: 142/78  Pulse: 55  Temp: 97.7 F (36.5 C)  TempSrc: Oral  Height: 5\' 3"  (1.6 m)  Weight: 188 lb 3.2 oz (85.367 kg)  SpO2: 96%    Gen: Pleasant, well-nourished, in no distress,  normal affect  ENT: No lesions,  mouth clear,  oropharynx clear, +++ postnasal drip, right nares purulence  Neck: No JVD, no TMG, no carotid bruits  Lungs: No use of accessory muscles, no dullness to  percussion, clear without rales or rhonchi  Cardiovascular: RRR, heart sounds normal, no murmur or gallops, no peripheral edema  Abdomen: soft and NT, no HSM,  BS normal  Musculoskeletal: No deformities, no cyanosis or clubbing  Neuro: alert, non focal  Skin: Warm, no lesions or rashes        Assessment & Plan:    Sinusitis, acute maxillary 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 aspirin 81 MG tablet Take 81 mg by mouth daily.        . beclomethasone (QVAR) 40 MCG/ACT inhaler Inhale 2 puffs into the lungs daily.      . Cetirizine HCl (ZYRTEC PO) Take 1 tablet by mouth daily.       . Cholecalciferol (VITAMIN D3) LIQD Take 3,000 Units by mouth daily.      . clobetasol (TEMOVATE) 0.05 % ointment As needed      . multivitamin (THERAGRAN) per tablet Take 1 tablet by mouth daily.        . multivitamin-lutein (OCUVITE-LUTEIN) CAPS Take 1 capsule by mouth daily.      Marland Kitchen omega-3 acid ethyl esters (LOVAZA) 1 G capsule HOLD Two weeks      . pantoprazole (PROTONIX) 40 MG tablet Take 40 mg by mouth daily as needed.       Marland Kitchen PROAIR HFA 108 (90 BASE) MCG/ACT inhaler Inhale 2 puffs into the lungs every 6 (six) hours as needed.       . simvastatin (ZOCOR) 20 MG tablet Take 20 mg by mouth at bedtime.        . sotalol (BETAPACE) 80 MG tablet Take 80 mg by mouth 2 (two) times daily.        Marland Kitchen venlafaxine (EFFEXOR-XR) 75 MG 24 hr capsule Take 1 tablet by mouth Daily.      Marland Kitchen DISCONTD: beclomethasone (QVAR) 40 MCG/ACT inhaler Inhale 2 puffs into the lungs 2 (two) times daily.  1 Inhaler  6  . DISCONTD: omega-3 acid ethyl esters (LOVAZA) 1 G capsule Take 2 g by mouth 2 (two) times daily.        Marland Kitchen dextromethorphan-guaiFENesin (MUCINEX DM) 30-600 MG per 12 hr tablet 1-2 twice daily for 7days then as needed      . fluticasone (FLONASE) 50 MCG/ACT nasal spray Place 2  sprays into the nose daily.  16 g  0  . levofloxacin (LEVAQUIN) 500 MG tablet Take 1 tablet (500 mg total) by mouth daily.  7 tablet  0  . DISCONTD: B Complex Vitamins (B COMPLEX-B12) TABS Take 1 tablet by mouth daily.        Marland Kitchen DISCONTD: CALCIUM-VITAMIN D PO Take 1 tablet by mouth.       . DISCONTD: Cholecalciferol (VITAMIN D3) 2000 UNITS TABS Take 1 tablet by mouth daily.        Marland Kitchen DISCONTD: Cholecalciferol (VITAMIN D3) 400 UNITS CAPS Take 1 capsule by mouth daily.        Marland Kitchen DISCONTD: Coenzyme Q10 (COQ10 PO) Take 1 tablet by mouth daily.        Marland Kitchen  DISCONTD: Misc Natural Products (OSTEO BI-FLEX ADV JOINT SHIELD PO) Take 2 tablets by mouth daily.        Marland Kitchen DISCONTD: Nutritional Supplements (WELLNESS ESSENTIALS FOR WOMEN PO) Take 1 capsule by mouth daily.      Marland Kitchen DISCONTD: vitamin C (ASCORBIC ACID) 500 MG tablet Take 500 mg by mouth daily.        Marland Kitchen DISCONTD: zinc gluconate 50 MG tablet Take 50 mg by mouth daily.

## 2011-12-17 NOTE — Assessment & Plan Note (Signed)
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

## 2011-12-17 NOTE — Patient Instructions (Signed)
Take levaquin one daily for 7days Use Mucinex DM 1-2 twice daily as needed for cough Keep sugar free lozenge in mouth at all times to train to swallow, not clear or cough Follow reflux diet Fluticasone two puff each nostril daily Hold fish oil for two weeks Hold zyrtec for 10days Saline rinse twice daily, work on R nostril, use  Distilled water Return 6 weeks

## 2011-12-23 ENCOUNTER — Encounter: Payer: Self-pay | Admitting: Cardiology

## 2011-12-30 DIAGNOSIS — J309 Allergic rhinitis, unspecified: Secondary | ICD-10-CM | POA: Insufficient documentation

## 2012-01-29 ENCOUNTER — Ambulatory Visit: Payer: Medicare Other | Admitting: Critical Care Medicine

## 2012-02-18 ENCOUNTER — Ambulatory Visit (INDEPENDENT_AMBULATORY_CARE_PROVIDER_SITE_OTHER): Payer: Medicare Other | Admitting: Critical Care Medicine

## 2012-02-18 ENCOUNTER — Encounter: Payer: Self-pay | Admitting: Critical Care Medicine

## 2012-02-18 VITALS — BP 122/80 | HR 54 | Temp 98.2°F | Ht 63.0 in | Wt 186.4 lb

## 2012-02-18 DIAGNOSIS — J45909 Unspecified asthma, uncomplicated: Secondary | ICD-10-CM

## 2012-02-18 MED ORDER — BECLOMETHASONE DIPROPIONATE 40 MCG/ACT IN AERS: 2.0000 | INHALATION_SPRAY | Freq: Every day | RESPIRATORY_TRACT | Status: DC

## 2012-02-18 NOTE — Progress Notes (Signed)
Subjective:    Patient ID: [name] [name], female    DOB: 01-04-1946, 66 y.o.   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  rx for 6months and got better.  Then went to [place] in Jan and had difficulty.  Trouble with steps/hills Then two months ago saw [facility] for bronchitis and rx zpak/codeine/proair.  Stayed on zyrtec.  In 2002 had PAF,  Did not tolerate BB. And Rx Sotolol. Now not in Afib.  After bronchtiis still coughing.  Rx another zpak and use inhaler q6H.   Now DOE going from bldg to bldg.  HAs episodic cough and feels tight in the chest .  HArd to take a deep breath.   Cough is dry.   Notes qhs dypsnea and cough   06/19/2011 Overall the patient is doing well without active issues   12/04/2011 Acute OV  Complains of sinus congestion with yellow drainage, PND, hoarseness, prod cough with brown mucus, wheezing x 5 days. OTC not working. Increased QVAR Twice daily  5 days ago.  No hemoptysis or chest pain . No edema.    3/26 Saw NP and Rx Zpak.  Pt still coughs and is yellow green.  Pt notes pndrip.  Cough never goes away. Notes some pndrip and sinus pressure.  Clears throat a lot.  No wheezing No change in medications. No SaBA use.    02/18/2012 Since last ov, doing well. Occ in Am pndrip otherwise better.   Pt denies any significant sore throat, nasal congestion or excess secretions, fever, chills, sweats, unintended weight loss, pleurtic or exertional chest pain, orthopnea PND, or leg swelling Pt denies any increase in rescue therapy over baseline, denies waking up needing it or having any early am or nocturnal exacerbations of coughing/wheezing/or dyspnea. Pt also denies any obvious fluctuation in symptoms with  weather or environmental change or other alleviating or aggravating factors  PUL ASTHMA HISTORY 02/18/2012 12/17/2011 06/19/2011  Symptoms 0-2 days/week Daily 0-2 days/week  Nighttime awakenings 0-2/month >1/wk but not nightly 0-2/month  Interference with  activity No limitations No limitations No limitations  SABA use 0-2 days/wk 0-2 days/wk 0-2 days/wk  Exacerbations requiring oral steroids 0-1 / year 0-1 / year 0-1 / year      Past Medical History  Diagnosis Date  . Atrial fibrillation   . Hypercholesterolemia   . Asthma 1976  . Hepatitis 1980s  . Bronchitis      Family History  Problem Relation Age of Onset  . Hypertension Sister   . Multiple sclerosis Daughter   . Lung cancer Father   . Hypertension Mother   . Breast cancer      maternal aunt  . Allergies Daughter   . Allergies Daughter   . Allergies Son      History   Social History  . Marital Status: Married    Spouse Name: N/A    Number of Children: 3  . Years of Education: N/A   Occupational History  . RN    Social History Main Topics  . Smoking status: Former Smoker -- 0.2 packs/day for 5 years    Types: Cigarettes    Quit date: 09/23/1980  . Smokeless tobacco: Never Used  . Alcohol Use: Yes     1 glass 5-6 times weekly  . Drug Use: No  . Sexually Active: Not on file   Other Topics Concern  . Not on file   Social History Narrative  . No  narrative on file     Allergies  Allergen Reactions  . Cephalosporins   . Nickel   . Sulfa Antibiotics     hives     Outpatient Prescriptions Prior to Visit  Medication Sig Dispense Refill  . aspirin 81 MG tablet Take 81 mg by mouth daily.        . Cetirizine HCl (ZYRTEC PO) Take 1 tablet by mouth daily.       . Cholecalciferol (VITAMIN D3) LIQD Take 3,000 Units by mouth daily.      . clobetasol (TEMOVATE) 0.05 % ointment As needed      . multivitamin (THERAGRAN) per tablet Take 1 tablet by mouth daily.        . multivitamin-lutein (OCUVITE-LUTEIN) CAPS Take 1 capsule by mouth daily.      . pantoprazole (PROTONIX) 40 MG tablet Take 40 mg by mouth daily as needed.       Marland Kitchen PROAIR HFA 108 (90 BASE) MCG/ACT inhaler Inhale 2 puffs into the lungs every 6 (six) hours as needed.       . simvastatin (ZOCOR) 20  MG tablet Take 20 mg by mouth at bedtime.        . sotalol (BETAPACE) 80 MG tablet Take 80 mg by mouth 2 (two) times daily.        Marland Kitchen venlafaxine (EFFEXOR-XR) 75 MG 24 hr capsule Take 0.5 tablets by mouth Daily.       Marland Kitchen dextromethorphan-guaiFENesin (MUCINEX DM) 30-600 MG per 12 hr tablet 1-2 twice daily for 7days then as needed      . omega-3 acid ethyl esters (LOVAZA) 1 G capsule HOLD Two weeks      . beclomethasone (QVAR) 40 MCG/ACT inhaler Inhale 2 puffs into the lungs daily.      . fluticasone (FLONASE) 50 MCG/ACT nasal spray Place 2 sprays into the nose daily.  16 g  0     Review of Systems  Constitutional:   No  weight loss, night sweats,  Fevers, chills, fatigue, lassitude. HEENT:   No headaches,  Difficulty swallowing,  Tooth/dental problems,  Sore throat,                No sneezing, itching, ear ache, +++nasal congestion, +++post nasal drip,   CV:  No chest pain,  Orthopnea, PND, swelling in lower extremities, anasarca, dizziness, palpitations  GI  No heartburn, indigestion, abdominal pain, nausea, vomiting, diarrhea, change in bowel habits, loss of appetite  Resp: Notes  shortness of breath with exertion not  at rest.  Notes  excess mucus, no productive cough,  No non-productive cough,  No coughing up of blood.  Notes  change in color of mucus.  No wheezing.  No chest wall deformity  Skin: no rash or lesions.  GU: no dysuria, change in color of urine, no urgency or frequency.  No flank pain.  MS:  No joint pain or swelling.  No decreased range of motion.  No back pain.  Psych:  No change in mood or affect. No depression or anxiety.  No memory loss.     Objective:   Physical Exam   Filed Vitals:   02/18/12 0950  BP: 122/80  Pulse: 54  Temp: 98.2 F (36.8 C)  TempSrc: Oral  Height: 5\' 3"  (1.6 m)  Weight: 186 lb 6.4 oz (84.55 kg)  SpO2: 99%    Gen: Pleasant, well-nourished, in no distress,  normal affect  ENT: No lesions,  mouth clear,  oropharynx clear, no  postnasal drip,   Neck: No JVD, no TMG, no carotid bruits  Lungs: No use of accessory muscles, no dullness to percussion, clear without rales or rhonchi  Cardiovascular: RRR, heart sounds normal, no murmur or gallops, no peripheral edema  Abdomen: soft and NT, no HSM,  BS normal  Musculoskeletal: No deformities, no cyanosis or clubbing  Neuro: alert, non focal  Skin: Warm, no lesions or rashes        Assessment & Plan:   Extrinsic asthma, unspecified Mild intermittent asthma stable at this time with exacerbating factors including allergic rhinitis and chronic sinusitis both of which are improved Plan Continue Qvar 2 puffs daily Return 6 months     Updated Medication List Outpatient Encounter Prescriptions as of 02/18/2012  Medication Sig Dispense Refill  . aspirin 81 MG tablet Take 81 mg by mouth daily.        . Cetirizine HCl (ZYRTEC PO) Take 1 tablet by mouth daily.       . Cholecalciferol (VITAMIN D3) LIQD Take 3,000 Units by mouth daily.      . clobetasol (TEMOVATE) 0.05 % ointment As needed      . multivitamin (THERAGRAN) per tablet Take 1 tablet by mouth daily.        . multivitamin-lutein (OCUVITE-LUTEIN) CAPS Take 1 capsule by mouth daily.      Marland Kitchen omega-3 acid ethyl esters (LOVAZA) 1 G capsule Take 2 g by mouth daily.      . pantoprazole (PROTONIX) 40 MG tablet Take 40 mg by mouth daily as needed.       Marland Kitchen PROAIR HFA 108 (90 BASE) MCG/ACT inhaler Inhale 2 puffs into the lungs every 6 (six) hours as needed.       . simvastatin (ZOCOR) 20 MG tablet Take 20 mg by mouth at bedtime.        . sotalol (BETAPACE) 80 MG tablet Take 80 mg by mouth 2 (two) times daily.        Marland Kitchen venlafaxine (EFFEXOR-XR) 75 MG 24 hr capsule Take 0.5 tablets by mouth Daily.       Marland Kitchen DISCONTD: dextromethorphan-guaiFENesin (MUCINEX DM) 30-600 MG per 12 hr tablet 1-2 twice daily for 7days then as needed      . DISCONTD: omega-3 acid ethyl esters (LOVAZA) 1 G capsule HOLD Two weeks      .  beclomethasone (QVAR) 40 MCG/ACT inhaler Inhale 2 puffs into the lungs daily.  1 Inhaler    . DISCONTD: beclomethasone (QVAR) 40 MCG/ACT inhaler Inhale 2 puffs into the lungs daily.      Marland Kitchen DISCONTD: dextromethorphan-guaiFENesin (MUCINEX DM) 30-600 MG per 12 hr tablet 1-2 twice daily as needed       . DISCONTD: fluticasone (FLONASE) 50 MCG/ACT nasal spray Place 2 sprays into the nose daily.  16 g  0

## 2012-02-18 NOTE — Assessment & Plan Note (Signed)
Mild intermittent asthma stable at this time with exacerbating factors including allergic rhinitis and chronic sinusitis both of which are improved Plan Continue Qvar 2 puffs daily Return 6 months

## 2012-02-18 NOTE — Patient Instructions (Signed)
Resume Qvar two puff daily No other changes Return 6 months, sooner if needed

## 2012-05-12 ENCOUNTER — Ambulatory Visit: Payer: Medicare Other | Admitting: Cardiology

## 2012-05-20 DIAGNOSIS — R946 Abnormal results of thyroid function studies: Secondary | ICD-10-CM | POA: Insufficient documentation

## 2012-06-04 ENCOUNTER — Encounter: Payer: Self-pay | Admitting: Cardiology

## 2012-06-04 ENCOUNTER — Ambulatory Visit (INDEPENDENT_AMBULATORY_CARE_PROVIDER_SITE_OTHER): Payer: Medicare Other | Admitting: Cardiology

## 2012-06-04 VITALS — BP 160/82 | HR 54 | Ht 63.0 in | Wt 184.8 lb

## 2012-06-04 DIAGNOSIS — I4891 Unspecified atrial fibrillation: Secondary | ICD-10-CM

## 2012-06-04 NOTE — Patient Instructions (Signed)
Continue your current therapy  Watch your blood pressure  I will see you again in one year

## 2012-06-04 NOTE — Progress Notes (Signed)
[name] Date of Birth: 1946-04-21   History of Present Illness: [name] [name] is seen for yearly followup. She has a history of atrial fibrillation that has been well controlled with sotalol since 2003. She states she has been feeling very well. She denies any symptoms of arrhythmia. She reports she was tried on hormonal replacement therapy but really noted no change in how she felt and so it was discontinued. She is still working part-time.  Current Outpatient Prescriptions on File Prior to Visit  Medication Sig Dispense Refill  . beclomethasone (QVAR) 40 MCG/ACT inhaler Inhale 2 puffs into the lungs daily.  1 Inhaler    . Cetirizine HCl (ZYRTEC PO) Take 1 tablet by mouth as needed.       . Cholecalciferol (VITAMIN D3) LIQD Take 3,000 Units by mouth daily.      . clobetasol (TEMOVATE) 0.05 % ointment As needed      . multivitamin (THERAGRAN) per tablet Take 1 tablet by mouth daily.        . multivitamin-lutein (OCUVITE-LUTEIN) CAPS Take 1 capsule by mouth daily.      Marland Kitchen omega-3 acid ethyl esters (LOVAZA) 1 G capsule Take 2 g by mouth daily.      . pantoprazole (PROTONIX) 40 MG tablet Take 40 mg by mouth daily as needed.       Marland Kitchen PROAIR HFA 108 (90 BASE) MCG/ACT inhaler Inhale 2 puffs into the lungs every 6 (six) hours as needed.       . sotalol (BETAPACE) 80 MG tablet Take 80 mg by mouth 2 (two) times daily.        Marland Kitchen venlafaxine (EFFEXOR-XR) 75 MG 24 hr capsule Take 0.5 tablets by mouth Daily.         Allergies  Allergen Reactions  . Cephalosporins   . Nickel   . Sulfa Antibiotics     hives    Past Medical History  Diagnosis Date  . Atrial fibrillation   . Hypercholesterolemia   . Asthma 1976  . Hepatitis 1980s  . Bronchitis     Past Surgical History  Procedure Date  . Vaginal hysterectomy 1973  . Ovary surgery 2001  . Knee arthroplasty 2007    right  . Tubal ligation 1972  . Appendectomy 1973    History  Smoking status  . Former Smoker -- 0.2 packs/day for 5  years  . Types: Cigarettes  . Quit date: 09/23/1980  Smokeless tobacco  . Never Used    History  Alcohol Use  . Yes    1 glass 5-6 times weekly    Family History  Problem Relation Age of Onset  . Hypertension Sister   . Multiple sclerosis Daughter   . Lung cancer Father   . Hypertension Mother   . Breast cancer      maternal aunt  . Allergies Daughter   . Allergies Daughter   . Allergies Son     Review of Systems: As noted in history of present illness.  All other systems were reviewed and are negative.  Physical Exam: BP 160/82  Pulse 54  Ht 5\' 3"  (1.6 m)  Wt 184 lb 12.8 oz (83.825 kg)  BMI 32.74 kg/m2 She is a pleasant overweight white female in no acute distress. Her HEENT exam is unremarkable. She has no JVD or bruits. Lungs are clear. Cardiac exam reveals a regular rate and rhythm without gallop, murmur, or click. She has no edema. Pedal pulses are good.  LABORATORY DATA: ECG demonstrates  sinus bradycardia with a rate of 54 beats per minute. There is voltage criteria for LVH. QTC is 422 ms.  Assessment / Plan: 1. Atrial fibrillation, well controlled on sotalol. We will continue with her current therapy.  2. Elevated blood pressure. This is unusual for her. She is going to monitor her blood pressure and if it remains elevated call me back.

## 2012-08-26 ENCOUNTER — Encounter: Payer: Self-pay | Admitting: Critical Care Medicine

## 2012-08-26 ENCOUNTER — Ambulatory Visit (INDEPENDENT_AMBULATORY_CARE_PROVIDER_SITE_OTHER): Payer: Medicare Other | Admitting: Critical Care Medicine

## 2012-08-26 VITALS — BP 144/84 | HR 54 | Temp 98.1°F | Ht 63.0 in | Wt 186.6 lb

## 2012-08-26 DIAGNOSIS — J45909 Unspecified asthma, uncomplicated: Secondary | ICD-10-CM

## 2012-08-26 NOTE — Progress Notes (Signed)
Subjective:    Patient ID: [name] [name], female    DOB: 02/24/1946, 66 y.o.   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  rx for 6months and got better.  Then went to [place] in Jan and had difficulty.  Trouble with steps/hills Then two months ago saw [facility] for bronchitis and rx zpak/codeine/proair.  Stayed on zyrtec.  In 2002 had PAF,  Did not tolerate BB. And Rx Sotolol. Now not in Afib.  After bronchtiis still coughing.  Rx another zpak and use inhaler q6H.   Now DOE going from bldg to bldg.  HAs episodic cough and feels tight in the chest .  HArd to take a deep breath.   Cough is dry.   Notes qhs dypsnea and cough   06/19/2011 Overall the patient is doing well without active issues   12/04/2011 Acute OV  Complains of sinus congestion with yellow drainage, PND, hoarseness, prod cough with brown mucus, wheezing x 5 days. OTC not working. Increased QVAR Twice daily  5 days ago.  No hemoptysis or chest pain . No edema.    3/26 Saw NP and Rx Zpak.  Pt still coughs and is yellow green.  Pt notes pndrip.  Cough never goes away. Notes some pndrip and sinus pressure.  Clears throat a lot.  No wheezing No change in medications. No SaBA use.    02/18/2012 Since last ov, doing well. Occ in Am pndrip otherwise better.   Pt denies any significant sore throat, nasal congestion or excess secretions, fever, chills, sweats, unintended weight loss, pleurtic or exertional chest pain, orthopnea PND, or leg swelling Pt denies any increase in rescue therapy over baseline, denies waking up needing it or having any early am or nocturnal exacerbations of coughing/wheezing/or dyspnea. Pt also denies any obvious fluctuation in symptoms with  weather or environmental change or other alleviating or aggravating factors  08/26/2012 The patient has had no issues since the last office visit. There has not been excess rescue inhaler use. The patient is not having daily symptoms. Pt denies any  significant sore throat, nasal congestion or excess secretions, fever, chills, sweats, unintended weight loss, pleurtic or exertional chest pain, orthopnea PND, or leg swelling Pt denies any increase in rescue therapy over baseline, denies waking up needing it or having any early am or nocturnal exacerbations of coughing/wheezing/or dyspnea. Pt also denies any obvious fluctuation in symptoms with  weather or environmental change or other alleviating or aggravating factors    PUL ASTHMA HISTORY 08/26/2012 02/18/2012 12/17/2011 06/19/2011  Symptoms 0-2 days/week 0-2 days/week Daily 0-2 days/week  Nighttime awakenings 0-2/month 0-2/month >1/wk but not nightly 0-2/month  Interference with activity No limitations No limitations No limitations No limitations  SABA use 0-2 days/wk 0-2 days/wk 0-2 days/wk 0-2 days/wk  Exacerbations requiring oral steroids 0-1 / year 0-1 / year 0-1 / year 0-1 / year      Past Medical History  Diagnosis Date  . Atrial fibrillation   . Hypercholesterolemia   . Asthma 1976  . Hepatitis 1980s  . Bronchitis      Family History  Problem Relation Age of Onset  . Hypertension Sister   . Multiple sclerosis Daughter   . Lung cancer Father   . Hypertension Mother   . Breast cancer      maternal aunt  . Allergies Daughter   . Allergies Daughter   . Allergies Son      History  Social History  . Marital Status: Married    Spouse Name: N/A    Number of Children: 3  . Years of Education: N/A   Occupational History  . RN    Social History Main Topics  . Smoking status: Former Smoker -- 0.2 packs/day for 5 years    Types: Cigarettes    Quit date: 09/23/1980  . Smokeless tobacco: Never Used  . Alcohol Use: Yes     Comment: 1 glass 5-6 times weekly  . Drug Use: No  . Sexually Active: Not on file   Other Topics Concern  . Not on file   Social History Narrative  . No narrative on file     Allergies  Allergen Reactions  . Cephalosporins   . Nickel    . Sulfa Antibiotics     hives     Outpatient Prescriptions Prior to Visit  Medication Sig Dispense Refill  . beclomethasone (QVAR) 40 MCG/ACT inhaler Inhale 2 puffs into the lungs daily.  1 Inhaler    . Cetirizine HCl (ZYRTEC PO) Take 1 tablet by mouth as needed.       . Cholecalciferol (VITAMIN D3) LIQD Take 3,000 Units by mouth daily.      . clobetasol (TEMOVATE) 0.05 % ointment As needed      . multivitamin (THERAGRAN) per tablet Take 1 tablet by mouth daily.        . multivitamin-lutein (OCUVITE-LUTEIN) CAPS Take 1 capsule by mouth daily.      Marland Kitchen omega-3 acid ethyl esters (LOVAZA) 1 G capsule Take 2 g by mouth daily.      . pantoprazole (PROTONIX) 40 MG tablet Take 40 mg by mouth daily as needed.       Marland Kitchen PROAIR HFA 108 (90 BASE) MCG/ACT inhaler Inhale 2 puffs into the lungs every 6 (six) hours as needed.       . sotalol (BETAPACE) 80 MG tablet Take 80 mg by mouth 2 (two) times daily.        Marland Kitchen venlafaxine (EFFEXOR-XR) 75 MG 24 hr capsule Take 75 mg by mouth Daily.          Review of Systems  Constitutional:   No  weight loss, night sweats,  Fevers, chills, fatigue, lassitude. HEENT:   No headaches,  Difficulty swallowing,  Tooth/dental problems,  Sore throat,                No sneezing, itching, ear ache, +++nasal congestion, +++post nasal drip,   CV:  No chest pain,  Orthopnea, PND, swelling in lower extremities, anasarca, dizziness, palpitations  GI  No heartburn, indigestion, abdominal pain, nausea, vomiting, diarrhea, change in bowel habits, loss of appetite  Resp: No shortness of breath with exertion not  at rest.  No  excess mucus, no productive cough,  No non-productive cough,  No coughing up of blood.  No  change in color of mucus.  No wheezing.  No chest wall deformity  Skin: no rash or lesions.  GU: no dysuria, change in color of urine, no urgency or frequency.  No flank pain.  MS:  No joint pain or swelling.  No decreased range of motion.  No back pain.  Psych:   No change in mood or affect. No depression or anxiety.  No memory loss.     Objective:   Physical Exam   Filed Vitals:   08/26/12 0918  BP: 144/84  Pulse: 54  Temp: 98.1 F (36.7 C)  TempSrc: Oral  Height: 5'  3" (1.6 m)  Weight: 186 lb 9.6 oz (84.641 kg)  SpO2: 96%    Gen: Pleasant, well-nourished, in no distress,  normal affect  ENT: No lesions,  mouth clear,  oropharynx clear, no  postnasal drip,   Neck: No JVD, no TMG, no carotid bruits  Lungs: No use of accessory muscles, no dullness to percussion, clear without rales or rhonchi  Cardiovascular: RRR, heart sounds normal, no murmur or gallops, no peripheral edema  Abdomen: soft and NT, no HSM,  BS normal  Musculoskeletal: No deformities, no cyanosis or clubbing  Neuro: alert, non focal  Skin: Warm, no lesions or rashes        Assessment & Plan:   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 amoxicillin (AMOXIL) 500 MG capsule As directed prior to dental appts      . aspirin 81 MG tablet Take 81 mg by mouth daily.      . beclomethasone (QVAR) 40 MCG/ACT inhaler Inhale 2 puffs into the lungs daily.  1 Inhaler    . Cetirizine HCl (ZYRTEC PO) Take 1 tablet by mouth as needed.       . Cholecalciferol (VITAMIN D3) LIQD Take 3,000 Units by mouth daily.      . clobetasol (TEMOVATE) 0.05 % ointment As needed      . fluticasone (FLONASE) 50 MCG/ACT nasal spray Place 2 sprays into the nose Once daily as needed.      . irbesartan (AVAPRO) 300 MG tablet Take 1 tablet by mouth daily.      Marland Kitchen MAGNESIUM PO Take 1 tablet by mouth daily.      . multivitamin (THERAGRAN) per tablet Take 1 tablet by mouth daily.        . multivitamin-lutein (OCUVITE-LUTEIN) CAPS Take 1 capsule by  mouth daily.      Marland Kitchen omega-3 acid ethyl esters (LOVAZA) 1 G capsule Take 2 g by mouth daily.      . pantoprazole (PROTONIX) 40 MG tablet Take 40 mg by mouth daily as needed.       Marland Kitchen PROAIR HFA 108 (90 BASE) MCG/ACT inhaler Inhale 2 puffs into the lungs every 6 (six) hours as needed.       . sotalol (BETAPACE) 80 MG tablet Take 80 mg by mouth 2 (two) times daily.        Marland Kitchen venlafaxine (EFFEXOR-XR) 75 MG 24 hr capsule Take 75 mg by mouth Daily.

## 2012-08-26 NOTE — Assessment & Plan Note (Signed)
Moderate persistent asthma with atopic features stable at this time Plan Maintain inhaled medications as prescribed Return 1 year

## 2012-08-26 NOTE — Patient Instructions (Signed)
No change in medications. Return in         1year, sooner if necessary

## 2012-11-19 ENCOUNTER — Other Ambulatory Visit: Payer: Self-pay

## 2012-11-19 DIAGNOSIS — Z1231 Encounter for screening mammogram for malignant neoplasm of breast: Secondary | ICD-10-CM

## 2012-12-10 ENCOUNTER — Other Ambulatory Visit: Payer: Self-pay | Admitting: Occupational Medicine

## 2012-12-10 ENCOUNTER — Ambulatory Visit: Payer: Self-pay

## 2012-12-10 DIAGNOSIS — M79641 Pain in right hand: Secondary | ICD-10-CM

## 2012-12-17 ENCOUNTER — Ambulatory Visit
Admission: RE | Admit: 2012-12-17 | Discharge: 2012-12-17 | Disposition: A | Discharge status: Home / Self Care | Payer: Medicare Other | Source: Ambulatory Visit

## 2012-12-17 DIAGNOSIS — Z1231 Encounter for screening mammogram for malignant neoplasm of breast: Secondary | ICD-10-CM

## 2013-01-01 DIAGNOSIS — R7301 Impaired fasting glucose: Secondary | ICD-10-CM | POA: Insufficient documentation

## 2013-05-27 ENCOUNTER — Ambulatory Visit (INDEPENDENT_AMBULATORY_CARE_PROVIDER_SITE_OTHER): Payer: Medicare Other | Admitting: Cardiology

## 2013-05-27 ENCOUNTER — Encounter: Payer: Self-pay | Admitting: Cardiology

## 2013-05-27 VITALS — BP 174/82 | HR 54 | Ht 63.0 in | Wt 190.8 lb

## 2013-05-27 DIAGNOSIS — I10 Essential (primary) hypertension: Secondary | ICD-10-CM

## 2013-05-27 DIAGNOSIS — I4891 Unspecified atrial fibrillation: Secondary | ICD-10-CM

## 2013-05-27 NOTE — Progress Notes (Signed)
[name] [name] Date of Birth: 1945-12-18   History of Present Illness: [name] [name] is seen for  followup. She has a history of atrial fibrillation that has been well controlled with sotalol since 2003. She states she has been feeling very well. She denies any symptoms of palpitations or tachycardia. She has been started on low pressure medication with Avapro. She is currently taking 300 mg a day. She reports her blood pressure has been in the 130s to 140s.  Current Outpatient Prescriptions on File Prior to Visit  Medication Sig Dispense Refill  . amoxicillin (AMOXIL) 500 MG capsule As directed prior to dental appts      . aspirin 81 MG tablet Take 81 mg by mouth daily.      . beclomethasone (QVAR) 40 MCG/ACT inhaler Inhale 2 puffs into the lungs daily.  1 Inhaler    . Cetirizine HCl (ZYRTEC PO) Take 1 tablet by mouth as needed.       . Cholecalciferol (VITAMIN D3) LIQD Take 3,000 Units by mouth daily.      . clobetasol (TEMOVATE) 0.05 % ointment As needed      . fluticasone (FLONASE) 50 MCG/ACT nasal spray Place 2 sprays into the nose Once daily as needed.      . irbesartan (AVAPRO) 300 MG tablet Take 1 tablet by mouth daily.      Marland Kitchen MAGNESIUM PO Take 1 tablet by mouth daily.      . multivitamin (THERAGRAN) per tablet Take 1 tablet by mouth daily.        . multivitamin-lutein (OCUVITE-LUTEIN) CAPS Take 1 capsule by mouth daily.      Marland Kitchen omega-3 acid ethyl esters (LOVAZA) 1 G capsule Take 2 g by mouth daily.      . pantoprazole (PROTONIX) 40 MG tablet Take 40 mg by mouth daily as needed.       Marland Kitchen PROAIR HFA 108 (90 BASE) MCG/ACT inhaler Inhale 2 puffs into the lungs every 6 (six) hours as needed.       . sotalol (BETAPACE) 80 MG tablet Take 80 mg by mouth 2 (two) times daily.        Marland Kitchen venlafaxine (EFFEXOR-XR) 75 MG 24 hr capsule Take 75 mg by mouth Daily.        No current facility-administered medications on file prior to visit.    Allergies  Allergen Reactions  . Cephalosporins   . Nickel    . Sulfa Antibiotics     hives    Past Medical History  Diagnosis Date  . Atrial fibrillation   . Hypercholesterolemia   . Asthma 1976  . Hepatitis 1980s  . Bronchitis   . HTN (hypertension)     Past Surgical History  Procedure Laterality Date  . Vaginal hysterectomy  1973  . Ovary surgery  2001  . Knee arthroplasty  2007    right  . Tubal ligation  1972  . Appendectomy  1973    History  Smoking status  . Former Smoker -- 0.20 packs/day for 5 years  . Types: Cigarettes  . Quit date: 09/23/1980  Smokeless tobacco  . Never Used    History  Alcohol Use  . Yes    Comment: 1 glass 5-6 times weekly    Family History  Problem Relation Age of Onset  . Hypertension Sister   . Multiple sclerosis Daughter   . Lung cancer Father   . Hypertension Mother   . Breast cancer      maternal aunt  .  Allergies Daughter   . Allergies Daughter   . Allergies Son     Review of Systems: As noted in history of present illness.  All other systems were reviewed and are negative.  Physical Exam: BP 174/82  Pulse 54  Ht 5\' 3"  (1.6 m)  Wt 190 lb 12.8 oz (86.546 kg)  BMI 33.81 kg/m2 She is a pleasant overweight white female in no acute distress. Her HEENT exam is unremarkable. She has no JVD or bruits. Lungs are clear. Cardiac exam reveals a regular rate and rhythm without gallop, murmur, or click. She has no edema. Pedal pulses are good.  LABORATORY DATA: ECG demonstrates sinus bradycardia with a rate of 54 beats per minute. QTC is 434 ms. it is otherwise normal.  Assessment / Plan: 1. Atrial fibrillation, well controlled on sotalol. We will continue with her current therapy. 2. Hypertension. Blood pressure is elevated today. She is going to monitor her blood pressure more closely at home. If it remains elevated we will need to add on therapy. Options would include amlodipine or HCTZ. I would not recommend medications that would cause additional bradycardia.

## 2013-05-27 NOTE — Patient Instructions (Signed)
Continue your Sotalol therapy  Get a home BP cuff and monitor your BP. Call me if it is staying high.  I will see you in 6 months

## 2013-06-23 ENCOUNTER — Encounter: Payer: Self-pay | Admitting: Cardiology

## 2013-07-29 ENCOUNTER — Other Ambulatory Visit: Payer: Self-pay

## 2013-09-06 ENCOUNTER — Ambulatory Visit (INDEPENDENT_AMBULATORY_CARE_PROVIDER_SITE_OTHER): Payer: Medicare Other | Admitting: Critical Care Medicine

## 2013-09-06 ENCOUNTER — Encounter: Payer: Self-pay | Admitting: Critical Care Medicine

## 2013-09-06 VITALS — BP 130/70 | HR 60 | Temp 97.8°F | Ht 64.0 in | Wt 192.0 lb

## 2013-09-06 DIAGNOSIS — J45909 Unspecified asthma, uncomplicated: Secondary | ICD-10-CM

## 2013-09-06 DIAGNOSIS — Z23 Encounter for immunization: Secondary | ICD-10-CM

## 2013-09-06 NOTE — Patient Instructions (Signed)
No changes in medications Prevnar 13 was given Return 6 months

## 2013-09-06 NOTE — Progress Notes (Signed)
Subjective:    Patient ID: [name] [name], female    DOB: 1945-10-17, 67 y.o.   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 Asthma    Breathing is doing well. Reports DOE. Denies chest tightness, coughing or wheezing at this time.  No issues over the spring/summer/fall ok so far.  Pt is exercising and doing well.  No issues with afib Pt denies any significant sore throat, nasal congestion or excess secretions, fever, chills, sweats, unintended weight loss, pleurtic or exertional chest pain, orthopnea PND, or leg swelling Pt denies any increase in rescue therapy over baseline, denies waking up needing it or having any early am or nocturnal exacerbations of coughing/wheezing/or dyspnea. Pt also denies any obvious fluctuation in symptoms with  weather or environmental change or other alleviating or aggravating factors        PUL ASTHMA HISTORY 09/06/2013 08/26/2012 02/18/2012 12/17/2011 06/19/2011  Symptoms 0-2 days/week 0-2 days/week 0-2 days/week Daily 0-2 days/week  Nighttime awakenings 0-2/month 0-2/month 0-2/month >1/wk but not nightly 0-2/month  Interference with activity No limitations No limitations No limitations No limitations No limitations  SABA use 0-2 days/wk 0-2 days/wk 0-2 days/wk 0-2 days/wk 0-2 days/wk  Exacerbations requiring oral steroids 0-1 / year 0-1 / year 0-1 / year 0-1 / year 0-1 / year      Past Medical History  Diagnosis Date  . Atrial fibrillation   . Hypercholesterolemia   . Asthma 1976  . Hepatitis 1980s  . Bronchitis   . HTN (hypertension)      Family History  Problem Relation Age of Onset  . Hypertension Sister   . Multiple sclerosis Daughter   . Lung cancer Father   . Hypertension Mother   . Breast cancer      maternal aunt  . Allergies Daughter   . Allergies Daughter   . Allergies Son      History   Social History  . Marital Status: Married    Spouse Name: N/A    Number of Children:  3  . Years of Education: N/A   Occupational History  . RN    Social History Main Topics  . Smoking status: Former Smoker -- 0.20 packs/day for 5 years    Types: Cigarettes    Quit date: 09/23/1980  . Smokeless tobacco: Never Used  . Alcohol Use: Yes     Comment: 1 glass 5-6 times weekly  . Drug Use: No  . Sexual Activity: Not on file   Other Topics Concern  . Not on file   Social History Narrative  . No narrative on file     Allergies  Allergen Reactions  . Cephalosporins   . Nickel   . Sulfa Antibiotics     hives     Outpatient Prescriptions Prior to Visit  Medication Sig Dispense Refill  . amoxicillin (AMOXIL) 500 MG capsule As directed prior to dental appts      . aspirin 81 MG tablet Take 81 mg by mouth daily.      Marland Kitchen atorvastatin (LIPITOR) 10 MG tablet Take 10 mg by mouth daily.       . beclomethasone (QVAR) 40 MCG/ACT inhaler Inhale 2 puffs into the lungs daily.  1 Inhaler    . Cetirizine HCl (ZYRTEC PO) Take 1 tablet by mouth as needed.       . Cholecalciferol (VITAMIN D3) LIQD Take 3,000 Units by mouth daily.      Marland Kitchen  clobetasol (TEMOVATE) 0.05 % ointment As needed      . fluticasone (FLONASE) 50 MCG/ACT nasal spray Place 2 sprays into the nose Once daily as needed.      . irbesartan (AVAPRO) 300 MG tablet Take 1 tablet by mouth daily.      Marland Kitchen MAGNESIUM PO Take 1 tablet by mouth daily.      . multivitamin (THERAGRAN) per tablet Take 1 tablet by mouth daily.        . multivitamin-lutein (OCUVITE-LUTEIN) CAPS Take 1 capsule by mouth daily.      Marland Kitchen omega-3 acid ethyl esters (LOVAZA) 1 G capsule Take 2 g by mouth daily.      . pantoprazole (PROTONIX) 40 MG tablet Take 40 mg by mouth daily as needed.       Marland Kitchen PROAIR HFA 108 (90 BASE) MCG/ACT inhaler Inhale 2 puffs into the lungs every 6 (six) hours as needed.       . sotalol (BETAPACE) 80 MG tablet Take 80 mg by mouth 2 (two) times daily.        Marland Kitchen venlafaxine (EFFEXOR-XR) 75 MG 24 hr capsule Take 75 mg by mouth Daily.         No facility-administered medications prior to visit.     Review of Systems Constitutional:   No  weight loss, night sweats,  Fevers, chills, fatigue, lassitude. HEENT:   No headaches,  Difficulty swallowing,  Tooth/dental problems,  Sore throat,                No sneezing, itching, ear ache, nasal congestion, +post nasal drip,   CV:  No chest pain,  Orthopnea, PND, swelling in lower extremities, anasarca, dizziness, palpitations  GI  No heartburn, indigestion, abdominal pain, nausea, vomiting, diarrhea, change in bowel habits, loss of appetite  Resp: No shortness of breath with exertion not  at rest.  No  excess mucus, no productive cough,  No non-productive cough,  No coughing up of blood.  No  change in color of mucus.  No wheezing.  No chest wall deformity  Skin: no rash or lesions.  GU: no dysuria, change in color of urine, no urgency or frequency.  No flank pain.  MS:  No joint pain or swelling.  No decreased range of motion.  No back pain.  Psych:  No change in mood or affect. No depression or anxiety.  No memory loss.     Objective:   Physical Exam   Filed Vitals:   09/06/13 1440 09/06/13 1441  BP:  130/70  Pulse:  60  Temp: 97.8 F (36.6 C)   TempSrc: Oral   Height: 5\' 4"  (1.626 m)   Weight: 192 lb (87.091 kg)   SpO2:  98%    Gen: Pleasant, well-nourished, in no distress,  normal affect  ENT: No lesions,  mouth clear,  oropharynx clear, no  postnasal drip,   Neck: No JVD, no TMG, no carotid bruits  Lungs: No use of accessory muscles, no dullness to percussion, clear without rales or rhonchi  Cardiovascular: RRR, heart sounds normal, no murmur or gallops, no peripheral edema  Abdomen: soft and NT, no HSM,  BS normal  Musculoskeletal: No deformities, no cyanosis or clubbing  Neuro: alert, non focal  Skin: Warm, no lesions or rashes        Assessment & Plan:   Moderate persistent asthma with atopic features Moderate persistent asthma with  significant atopic features stable at this time Plan Maintain inhaled medications as prescribed  Administer Prevnar 13 pneumococcal vaccine    Updated Medication List Outpatient Encounter Prescriptions as of 09/06/2013  Medication Sig  . amoxicillin (AMOXIL) 500 MG capsule As directed prior to dental appts  . aspirin 81 MG tablet Take 81 mg by mouth daily.  Marland Kitchen atorvastatin (LIPITOR) 10 MG tablet Take 10 mg by mouth daily.   . beclomethasone (QVAR) 40 MCG/ACT inhaler Inhale 2 puffs into the lungs daily.  . Cetirizine HCl (ZYRTEC PO) Take 1 tablet by mouth as needed.   . Cholecalciferol (VITAMIN D3) LIQD Take 3,000 Units by mouth daily.  . clobetasol (TEMOVATE) 0.05 % ointment As needed  . fluticasone (FLONASE) 50 MCG/ACT nasal spray Place 2 sprays into the nose Once daily as needed.  . irbesartan (AVAPRO) 300 MG tablet Take 1 tablet by mouth daily.  Marland Kitchen MAGNESIUM PO Take 1 tablet by mouth daily.  . multivitamin (THERAGRAN) per tablet Take 1 tablet by mouth daily.    . multivitamin-lutein (OCUVITE-LUTEIN) CAPS Take 1 capsule by mouth daily.  Marland Kitchen omega-3 acid ethyl esters (LOVAZA) 1 G capsule Take 2 g by mouth daily.  . pantoprazole (PROTONIX) 40 MG tablet Take 40 mg by mouth daily as needed.   Marland Kitchen PROAIR HFA 108 (90 BASE) MCG/ACT inhaler Inhale 2 puffs into the lungs every 6 (six) hours as needed.   . sotalol (BETAPACE) 80 MG tablet Take 80 mg by mouth 2 (two) times daily.    Marland Kitchen venlafaxine (EFFEXOR-XR) 75 MG 24 hr capsule Take 75 mg by mouth Daily.

## 2013-09-07 NOTE — Assessment & Plan Note (Signed)
Moderate persistent asthma with significant atopic features stable at this time Plan Maintain inhaled medications as prescribed Administer Prevnar 13 pneumococcal vaccine

## 2014-01-14 ENCOUNTER — Institutional Professional Consult (permissible substitution): Payer: Self-pay | Admitting: Internal Medicine

## 2014-01-18 ENCOUNTER — Ambulatory Visit (INDEPENDENT_AMBULATORY_CARE_PROVIDER_SITE_OTHER): Payer: Commercial Managed Care - HMO | Admitting: Cardiology

## 2014-01-18 ENCOUNTER — Encounter: Payer: Self-pay | Admitting: Cardiology

## 2014-01-18 VITALS — BP 130/64 | HR 52 | Ht 64.0 in | Wt 189.0 lb

## 2014-01-18 DIAGNOSIS — I4891 Unspecified atrial fibrillation: Secondary | ICD-10-CM

## 2014-01-18 DIAGNOSIS — E7800 Pure hypercholesterolemia, unspecified: Secondary | ICD-10-CM

## 2014-01-18 DIAGNOSIS — I10 Essential (primary) hypertension: Secondary | ICD-10-CM

## 2014-01-18 NOTE — Progress Notes (Signed)
[name] [name] Date of Birth: 09-19-1946   History of Present Illness: [name] [name] is seen for  followup. She has a history of atrial fibrillation that has been well controlled with sotalol since 2003. Prior use of Toprol caused her to be very fatigued and calcium channel blockers did not help. She states she has been feeling very well. She denies any symptoms of palpitations or tachycardia. Blood pressure has been well controlled on Avapro. She has joined a gym and is using a Physiological scientist.  Current Outpatient Prescriptions on File Prior to Visit  Medication Sig Dispense Refill  . amoxicillin (AMOXIL) 500 MG capsule As directed prior to dental appts      . aspirin 81 MG tablet Take 81 mg by mouth daily.      Marland Kitchen atorvastatin (LIPITOR) 10 MG tablet Take 10 mg by mouth daily.       . beclomethasone (QVAR) 40 MCG/ACT inhaler Inhale 2 puffs into the lungs daily.  1 Inhaler    . Cetirizine HCl (ZYRTEC PO) Take 1 tablet by mouth as needed.       . Cholecalciferol (VITAMIN D3) LIQD Take 4,000 Units by mouth daily.       . clobetasol (TEMOVATE) 0.05 % ointment As needed      . fluticasone (FLONASE) 50 MCG/ACT nasal spray Place 2 sprays into the nose Once daily as needed.      . multivitamin (THERAGRAN) per tablet Take 1 tablet by mouth daily.        . multivitamin-lutein (OCUVITE-LUTEIN) CAPS Take 1 capsule by mouth daily.      Marland Kitchen omega-3 acid ethyl esters (LOVAZA) 1 G capsule Take 2 g by mouth daily.      . pantoprazole (PROTONIX) 40 MG tablet Take 40 mg by mouth daily as needed.       Marland Kitchen PROAIR HFA 108 (90 BASE) MCG/ACT inhaler Inhale 2 puffs into the lungs every 6 (six) hours as needed.       . sotalol (BETAPACE) 80 MG tablet Take 80 mg by mouth 2 (two) times daily.        Marland Kitchen venlafaxine (EFFEXOR-XR) 75 MG 24 hr capsule Take 75 mg by mouth Daily.       Marland Kitchen MAGNESIUM PO Take 1 tablet by mouth daily.       No current facility-administered medications on file prior to visit.    Allergies  Allergen  Reactions  . Cephalosporins   . Nickel   . Sulfa Antibiotics     hives    Past Medical History  Diagnosis Date  . Atrial fibrillation   . Hypercholesterolemia   . Asthma 1976  . Hepatitis 1980s  . Bronchitis   . HTN (hypertension)     Past Surgical History  Procedure Laterality Date  . Vaginal hysterectomy  1973  . Ovary surgery  2001  . Knee arthroplasty  2007    right  . Tubal ligation  1972  . Appendectomy  1973    History  Smoking status  . Former Smoker -- 0.20 packs/day for 5 years  . Types: Cigarettes  . Quit date: 09/23/1980  Smokeless tobacco  . Never Used    History  Alcohol Use  . Yes    Comment: 1 glass 5-6 times weekly    Family History  Problem Relation Age of Onset  . Hypertension Sister   . Multiple sclerosis Daughter   . Lung cancer Father   . Hypertension Mother   . Breast cancer  maternal aunt  . Allergies Daughter   . Allergies Daughter   . Allergies Son     Review of Systems: As noted in history of present illness.  All other systems were reviewed and are negative.  Physical Exam: BP 130/64  Pulse 52  Ht 5\' 4"  (1.626 m)  Wt 189 lb (85.73 kg)  BMI 32.43 kg/m2 She is a pleasant overweight white female in no acute distress. Her HEENT exam is unremarkable. She has no JVD or bruits. Lungs are clear. Cardiac exam reveals a regular rate and rhythm without gallop, murmur, or click. She has no edema. Pedal pulses are good.  LBORATORY DATA: Lab Report: COMPLETE METABOLIC, CBC, LIPID, Apolipoprotein B, TSH, VITAM ... 2014/01/03 HGBA1C 5.7 % 4.5-6.0 Hemoglobin A1c/Hemoglobin.total in Blood 2014/01/03 VIT D 25-OH 29.8 ng/mL 30.0-100.0 L vitamin D 25-hydroxy, serum 2014/01/03 TSH 2.22 u[iU]/mL 0.40-4.20 thyroid stimulating hormone, serum 2014/01/03 APO B 94.0 mg/dL 2014/01/05 H apolipoprotein B, serum 2014/01/03 NON-HDL CHOL 134.0 (?) mg/dL cholesterol, non-HDL, total 2014/01/03 LIPID PROFIL 1.7 1.7-2.5 lipid profile 2014/01/03 LDL 102  mg/dL Cholesterol in LDL [Mass/volume] in Serum or Plasma 2014/01/03 HDL/CHOL % 60 MG/DL 2014/01/05 HDL cholesterol, serum as percentage of serum cholesterol 2014/01/03 TRIG 160 30-149 H Triglyceride [Mass/volume] in Serum or Plasma 2014/01/03 CHOLESTEROL 194 mg/dL 2014/01/05 Cholesterol [Mass/volume] in Serum or Plasma 2014/01/03 PLATELETK/UL 204 10*3/uL 140-440 platelet count 2014/01/03 MCHC 33.1 G/DL 2014/01/05 mean corpuscular hemoglobin concentration, RBC 2014/01/03 MCH 31.8 pg 26.0-32.0 mean corpuscular hemoglobin, RBC 2014/01/03 MCV 96.0 fL 80.0-97.0 mean corpuscular volume, RBC 2014/01/03 HCT 39.3 % 37.0-51.0 hematocrit, blood 2014/01/03 HGB 13.0 g/dL 2014/01/05 hemoglobin, blood 2014/01/03 RBC 4.1 M/UL 10*6/mm3 4.2-6.3 L erythrocyte (RBC) count 2014/01/03 GRANULOCPCT 48.9 % 37.0-92.0 granulocyte percent, blood 2014/01/03 %CD8 42.9 10.0-58.5 CD8 LYMPHOCYTES, PERCENT 2014/01/03 GR% 3.0 K/UL % 2.0-7.8 Granulocyte percent 2014/01/03 T-LYMPHS TOT 2.7 K/UL 10*3 0.6-4.1 T lymphocyte count, blood, total 2014/01/03 WBC TOTAL 6.20 4.10-10.90 WBC Total Count 2014/01/03 BILI TOTAL 1.1 mg/dL 2014/01/05 bilirubin, serum, total 2014/01/03 ALK PHOS 74 U/L 37-137 alkaline phosphatase, serum 2014/01/03 SGPT (ALT) 22 U/L 5-40 alanine aminotransferase (SGPT), serum 2014/01/03 SGOT (AST) 25 U/L 7-45 aspartate aminotransferase (SGOT), serum 2014/01/03 ALBUMIN 4.0 g/dL 2014/01/05 albumin, serum 2014/01/03 PROTEIN, TOT 7.4 g/dL 2014/01/05 protein, total, serum 2014/01/03 CALCIUM 9.5 mg/dL 2014/01/05 calcium, serum 2014/01/03 CO2 TOTAL 25 MEQ/L 15-35 carbon dioxide, serum, total 2014/01/03 CL SERUM 104 meq/L 80-111 chloride, serum 2014/01/03 POTASSIUM 4.4 MEQ/L 3.5-5.3 potassium, serum 2014/01/03 SODIUM 139 MEQ/L 135-148 sodium, serum 2014/01/03 EGFR IF AFA 75.1 (?) mL/min/1.52m2 eGFR if African American 2014/01/03 EGFR NOT AFA 62.1 (?) mL/min/1.74m2 eGFR if not African American 2014/01/03 CREATININE 0.9 mg/dL 2014/01/05 creatinine, serum 2014/01/03 BUN  20 mg/dL 2014/01/05 urea nitrogen, blood 2014/01/03 GLUCOSE SER 106 mg/dL 2014/01/05 blood glucose Lab Report: MICROALBUMIN/CREATININE, URINALYSIS, URINE MICROSCOPIC 2014/01/03 UOTHER NONE urine other 2014/01/03 CASTS URINE NONE /[LPF] NEGATIVE casts, urine 2014/01/03 MUCUS URINE NEGATIVE LIGHT mucus on urinalysis 2014/01/03 YEAST UR QN NEGATIVE /[HPF] NEGATIVE yeast, urine, by microscopy 2014/01/03 EPI CELL UR OCC /[LPF] NEGATIVE epithelial cells, urine 2014/01/03 CRYSTAL U NONE NEGATIVE crystals by microscopy, urine 2014/01/03 RBCS MICRO U NEGATIVE NEGATIVE RBC urine by microscopy 2014/01/03 WBCS MICRO U 0-5 /[HPF] NEGATIVE WBC urine on microscopy 2014/01/03 BILIRUBIN UR NEGATIVE NEGATIVE bilirubin, urine 2014/01/03 WBC URINE TS TRACE none NEGATIVE leukocytes, in urine, by test strip 2014/01/03 NITRATE UR NEGATIVE NEGATIVE nitrate, urine 2014/01/03 BLOOD UR NEGATIVE NEGATIVE BLOOD, URINE (hematuria) 2014/01/03 KETONES UR NEGATIVE NEGATIVE KETONES, URINE 2014/01/03 UROBILINO UR 0.2 MG/DL 0.2 mg/dL urobilinogen, urine 2014/01/05  SPEC GR URIN >=1.030 1.000 - 1.030 specific gravity, urine 2014/01/03 URINALYSIS 6.0 5.0-9.0 urinalysis, routine 2014/01/03 MALBCR RATIO 3.2 MG/DL 0.0-30.0 Microalbumin/Creatinine [Mass Ratio] in Urine 2014/01/03 CREATININE U 246.4 MG/DL 47.0-110.0 H creatinine, urine    Assessment / Plan: 1. Atrial fibrillation, well controlled on sotalol. We will continue with her current therapy. I will follow up in 6 months with an Ecg.  2. Hypertension. Blood pressure is well controlled today.

## 2014-01-18 NOTE — Patient Instructions (Signed)
Continue your current therapy  I will see you in 6 months.   

## 2014-03-09 ENCOUNTER — Ambulatory Visit: Payer: Self-pay | Admitting: Critical Care Medicine

## 2014-03-23 ENCOUNTER — Encounter: Payer: Self-pay | Admitting: Critical Care Medicine

## 2014-03-23 ENCOUNTER — Ambulatory Visit (INDEPENDENT_AMBULATORY_CARE_PROVIDER_SITE_OTHER): Payer: Commercial Managed Care - HMO | Admitting: Critical Care Medicine

## 2014-03-23 VITALS — BP 134/80 | HR 56 | Ht 64.0 in | Wt 191.8 lb

## 2014-03-23 DIAGNOSIS — J45909 Unspecified asthma, uncomplicated: Secondary | ICD-10-CM

## 2014-03-23 NOTE — Patient Instructions (Signed)
No change in medications. Return in        6 months        

## 2014-03-23 NOTE — Assessment & Plan Note (Signed)
Moderate persistent asthma stable at this time Plan Maintain inhaled steroid as prescribed

## 2014-03-23 NOTE — Progress Notes (Signed)
Subjective:    Patient ID: [name] [name], female    DOB: 28-Jan-1946, 68 y.o.   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 Follow-up    Breathing doing well.  Does have occas sneezing - relieved with zyrtec.  No SOB, cough, chest tightness/pain, or wheezing.  No new issues.   Pt denies any significant sore throat, nasal congestion or excess secretions, fever, chills, sweats, unintended weight loss, pleurtic or exertional chest pain, orthopnea PND, or leg swelling Pt denies any increase in rescue therapy over baseline, denies waking up needing it or having any early am or nocturnal exacerbations of coughing/wheezing/or dyspnea. Pt also denies any obvious fluctuation in symptoms with  weather or environmental change or other alleviating or aggravating factors     PUL ASTHMA HISTORY 03/23/2014 09/06/2013 08/26/2012 02/18/2012 12/17/2011  Symptoms 0-2 days/week 0-2 days/week 0-2 days/week 0-2 days/week Daily  Nighttime awakenings 0-2/month 0-2/month 0-2/month 0-2/month >1/wk but not nightly  Interference with activity No limitations No limitations No limitations No limitations No limitations  SABA use 0-2 days/wk 0-2 days/wk 0-2 days/wk 0-2 days/wk 0-2 days/wk  Exacerbations requiring oral steroids 0-1 / year 0-1 / year 0-1 / year 0-1 / year 0-1 / year     Review of Systems  Constitutional:   No  weight loss, night sweats,  Fevers, chills, fatigue, lassitude. HEENT:   No headaches,  Difficulty swallowing,  Tooth/dental problems,  Sore throat,                No sneezing, itching, ear ache, nasal congestion, +post nasal drip,   CV:  No chest pain,  Orthopnea, PND, swelling in lower extremities, anasarca, dizziness, palpitations  GI  No heartburn, indigestion, abdominal pain, nausea, vomiting, diarrhea, change in bowel habits, loss of appetite  Resp: No shortness of breath with exertion not  at rest.  No  excess mucus, no productive cough,  No  non-productive cough,  No coughing up of blood.  No  change in color of mucus.  No wheezing.  No chest wall deformity  Skin: no rash or lesions.  GU: no dysuria, change in color of urine, no urgency or frequency.  No flank pain.  MS:  No joint pain or swelling.  No decreased range of motion.  No back pain.  Psych:  No change in mood or affect. No depression or anxiety.  No memory loss.     Objective:   Physical Exam   Filed Vitals:   03/23/14 1018  BP: 134/80  Pulse: 56  Height: 5\' 4"  (1.626 m)  Weight: 191 lb 12.8 oz (87 kg)  SpO2: 96%    Gen: Pleasant, well-nourished, in no distress,  normal affect  ENT: No lesions,  mouth clear,  oropharynx clear, no  postnasal drip,   Neck: No JVD, no TMG, no carotid bruits  Lungs: No use of accessory muscles, no dullness to percussion, clear without rales or rhonchi  Cardiovascular: RRR, heart sounds normal, no murmur or gallops, no peripheral edema  Abdomen: soft and NT, no HSM,  BS normal  Musculoskeletal: No deformities, no cyanosis or clubbing  Neuro: alert, non focal  Skin: Warm, no lesions or rashes        Assessment & Plan:   Moderate persistent asthma with atopic features Moderate persistent asthma stable at this time Plan Maintain inhaled steroid as prescribed    Updated Medication List Outpatient Encounter Prescriptions as of 03/23/2014  Medication Sig  . amoxicillin (AMOXIL) 500 MG capsule As directed prior to dental appts  . aspirin 81 MG tablet Take 81 mg by mouth daily.  Marland Kitchen atorvastatin (LIPITOR) 10 MG tablet Take 10 mg by mouth daily.   . beclomethasone (QVAR) 40 MCG/ACT inhaler Inhale 2 puffs into the lungs daily.  . Cetirizine HCl (ZYRTEC PO) Take 1 tablet by mouth as needed.   . Cholecalciferol (VITAMIN D3) LIQD Take 4,000 Units by mouth daily.   . clobetasol (TEMOVATE) 0.05 % ointment As needed  . fluticasone (FLONASE) 50 MCG/ACT nasal spray Place 2 sprays into the nose Once daily as needed.  .  irbesartan (AVAPRO) 300 MG tablet Take 300 mg by mouth daily.  . Multiple Vitamins-Minerals (ICAPS PO) Take 1 capsule by mouth daily.  . multivitamin (THERAGRAN) per tablet Take 1 tablet by mouth daily.    Marland Kitchen omega-3 acid ethyl esters (LOVAZA) 1 G capsule Take 2 g by mouth daily.  . pantoprazole (PROTONIX) 40 MG tablet Take 40 mg by mouth daily.   Marland Kitchen PROAIR HFA 108 (90 BASE) MCG/ACT inhaler Inhale 2 puffs into the lungs every 6 (six) hours as needed.   . sotalol (BETAPACE) 80 MG tablet Take 80 mg by mouth 2 (two) times daily.    Marland Kitchen venlafaxine (EFFEXOR-XR) 75 MG 24 hr capsule Take 75 mg by mouth Daily.   . [DISCONTINUED] MAGNESIUM PO Take 1 tablet by mouth daily.  . [DISCONTINUED] Multiple Vitamins-Minerals ([place]) [place] FORMULA CAPS  . [DISCONTINUED] multivitamin-lutein (OCUVITE-LUTEIN) CAPS Take 1 capsule by mouth daily.

## 2014-07-21 ENCOUNTER — Ambulatory Visit (INDEPENDENT_AMBULATORY_CARE_PROVIDER_SITE_OTHER): Payer: Commercial Managed Care - HMO | Admitting: Cardiology

## 2014-07-21 ENCOUNTER — Encounter: Payer: Self-pay | Admitting: Cardiology

## 2014-07-21 VITALS — BP 140/80 | HR 59 | Ht 64.0 in | Wt 192.0 lb

## 2014-07-21 DIAGNOSIS — I480 Paroxysmal atrial fibrillation: Secondary | ICD-10-CM

## 2014-07-21 DIAGNOSIS — I10 Essential (primary) hypertension: Secondary | ICD-10-CM

## 2014-07-21 NOTE — Progress Notes (Signed)
[name] [name] Date of Birth: [birth year]   History of Present Illness: [name] [name] is seen for  followup. She has a history of atrial fibrillation that has been well controlled with sotalol since 2003. Prior use of Toprol caused her to be very fatigued and calcium channel blockers did not help. She states she has been feeling very well. On a couple of occasions she noted some bigeminy and trigeminy that lasted about one hour. No SOB or chest pain. [place] and sprained her ankle in July.  Current Outpatient Prescriptions on File Prior to Visit  Medication Sig Dispense Refill  . amoxicillin (AMOXIL) 500 MG capsule As directed prior to dental appts      . aspirin 81 MG tablet Take 81 mg by mouth daily.      Marland Kitchen atorvastatin (LIPITOR) 10 MG tablet Take 10 mg by mouth daily.       . beclomethasone (QVAR) 40 MCG/ACT inhaler Inhale 2 puffs into the lungs daily.  1 Inhaler    . Cetirizine HCl (ZYRTEC PO) Take 1 tablet by mouth as needed.       . Cholecalciferol (VITAMIN D3) LIQD Take 4,000 Units by mouth daily.       . clobetasol (TEMOVATE) 0.05 % ointment As needed      . fluticasone (FLONASE) 50 MCG/ACT nasal spray Place 2 sprays into the nose Once daily as needed.      . irbesartan (AVAPRO) 300 MG tablet Take 300 mg by mouth daily.      . Multiple Vitamins-Minerals (ICAPS PO) Take 1 capsule by mouth daily.      . multivitamin (THERAGRAN) per tablet Take 1 tablet by mouth daily.        Marland Kitchen omega-3 acid ethyl esters (LOVAZA) 1 G capsule Take 2 g by mouth daily.      . pantoprazole (PROTONIX) 40 MG tablet Take 40 mg by mouth daily.       Marland Kitchen PROAIR HFA 108 (90 BASE) MCG/ACT inhaler Inhale 2 puffs into the lungs every 6 (six) hours as needed.       . sotalol (BETAPACE) 80 MG tablet Take 80 mg by mouth 2 (two) times daily.        Marland Kitchen venlafaxine (EFFEXOR-XR) 75 MG 24 hr capsule Take 75 mg by mouth Daily.        No current facility-administered medications on file prior to visit.    Allergies  Allergen  Reactions  . Cephalosporins   . Nickel   . Sulfa Antibiotics     hives    Past Medical History  Diagnosis Date  . Atrial fibrillation   . Hypercholesterolemia   . Asthma 1976  . Hepatitis 1980s  . Bronchitis   . HTN (hypertension)     Past Surgical History  Procedure Laterality Date  . Vaginal hysterectomy  1973  . Ovary surgery  2001  . Knee arthroplasty  2007    right  . Tubal ligation  1972  . Appendectomy  1973    History  Smoking status  . Former Smoker -- 0.20 packs/day for 5 years  . Types: Cigarettes  . Quit date: 09/23/1980  Smokeless tobacco  . Never Used    History  Alcohol Use  . Yes    Comment: 1 glass 5-6 times weekly    Family History  Problem Relation Age of Onset  . Hypertension Sister   . Multiple sclerosis Daughter   . Lung cancer Father   . Hypertension Mother   .  Breast cancer      maternal aunt  . Allergies Daughter   . Allergies Daughter   . Allergies Son     Review of Systems: As noted in history of present illness.  All other systems were reviewed and are negative.  Physical Exam: BP 140/80  Pulse 59  Ht 5\' 4"  (1.626 m)  Wt 192 lb (87.091 kg)  BMI 32.94 kg/m2 She is a pleasant overweight white female in no acute distress. Her HEENT exam is unremarkable. She has no JVD or bruits. Lungs are clear. Cardiac exam reveals a regular rate and rhythm without gallop, murmur, or click. She has no edema. Pedal pulses are good.  LBORATORY DATA: Ecg today: NSR rate 59 bpm. Qtc 443 msec.I have personally reviewed and interpreted this study.   Assessment / Plan: 1. Atrial fibrillation, well controlled on sotalol. We will continue with her current therapy. I will follow up in one year  2. Hypertension. Blood pressure is well controlled today.

## 2014-07-21 NOTE — Patient Instructions (Signed)
Continue your current therapy  I will see you in one year   

## 2014-07-22 ENCOUNTER — Ambulatory Visit: Payer: Self-pay | Admitting: Cardiology

## 2014-07-22 ENCOUNTER — Ambulatory Visit: Payer: Self-pay | Admitting: Physician Assistant

## 2014-10-05 ENCOUNTER — Ambulatory Visit: Payer: Commercial Managed Care - HMO | Admitting: Critical Care Medicine

## 2014-10-26 ENCOUNTER — Encounter: Payer: Self-pay | Admitting: Critical Care Medicine

## 2014-10-26 ENCOUNTER — Ambulatory Visit (INDEPENDENT_AMBULATORY_CARE_PROVIDER_SITE_OTHER): Payer: Commercial Managed Care - HMO | Admitting: Critical Care Medicine

## 2014-10-26 VITALS — BP 152/82 | HR 56 | Temp 99.0°F | Ht 64.0 in | Wt 188.6 lb

## 2014-10-26 DIAGNOSIS — J4540 Moderate persistent asthma, uncomplicated: Secondary | ICD-10-CM

## 2014-10-26 NOTE — Patient Instructions (Signed)
No change in medications. Return in        6 months        

## 2014-10-26 NOTE — Progress Notes (Signed)
Subjective:    Patient ID: [name] [name], female    DOB: 05-Nov-1945, 69 y.o.   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 Follow-up    Breathing doing well.  Does have occas sneezing - relieved with zyrtec.  No SOB, cough, chest tightness/pain, or wheezing.  No new issues.   Pt denies any significant sore throat, nasal congestion or excess secretions, fever, chills, sweats, unintended weight loss, pleurtic or exertional chest pain, orthopnea PND, or leg swelling Pt denies any increase in rescue therapy over baseline, denies waking up needing it or having any early am or nocturnal exacerbations of coughing/wheezing/or dyspnea. Pt also denies any obvious fluctuation in symptoms with  weather or environmental change or other alleviating or aggravating factors   10/26/2014 Chief Complaint  Patient presents with  . 6 month follow up    Breathing doing well overall.  No SOB, wheezing, chest tightness/CP, or cough at this time.  No daily symptoms.  No SABA use.    PUL ASTHMA HISTORY 10/26/2014 03/23/2014 09/06/2013 08/26/2012 02/18/2012 12/17/2011 06/19/2011  Symptoms 0-2 days/week 0-2 days/week 0-2 days/week 0-2 days/week 0-2 days/week Daily 0-2 days/week  Nighttime awakenings 0-2/month 0-2/month 0-2/month 0-2/month 0-2/month >1/wk but not nightly 0-2/month  Interference with activity No limitations No limitations No limitations No limitations No limitations No limitations No limitations  SABA use 0-2 days/wk 0-2 days/wk 0-2 days/wk 0-2 days/wk 0-2 days/wk 0-2 days/wk 0-2 days/wk  Exacerbations requiring oral steroids 0-1 / year 0-1 / year 0-1 / year 0-1 / year 0-1 / year 0-1 / year 0-1 / year     Review of Systems Constitutional:   No  weight loss, night sweats,  Fevers, chills, fatigue, lassitude. HEENT:   No headaches,  Difficulty swallowing,  Tooth/dental problems,  Sore throat,                No sneezing, itching, ear ache, nasal congestion,  +post nasal drip,   CV:  No chest pain,  Orthopnea, PND, swelling in lower extremities, anasarca, dizziness, palpitations  GI  No heartburn, indigestion, abdominal pain, nausea, vomiting, diarrhea, change in bowel habits, loss of appetite  Resp: No shortness of breath with exertion not  at rest.  No  excess mucus, no productive cough,  No non-productive cough,  No coughing up of blood.  No  change in color of mucus.  No wheezing.  No chest wall deformity  Skin: no rash or lesions.  GU: no dysuria, change in color of urine, no urgency or frequency.  No flank pain.  MS:  No joint pain or swelling.  No decreased range of motion.  No back pain.  Psych:  No change in mood or affect. No depression or anxiety.  No memory loss.     Objective:   Physical Exam  Filed Vitals:   10/26/14 1144  BP: 152/82  Pulse: 56  Temp: 99 F (37.2 C)  TempSrc: Oral  Height: 5\' 4"  (1.626 m)  Weight: 188 lb 9.6 oz (85.548 kg)  SpO2: 98%    Gen: Pleasant, well-nourished, in no distress,  normal affect  ENT: No lesions,  mouth clear,  oropharynx clear, no  postnasal drip,   Neck: No JVD, no TMG, no carotid bruits  Lungs: No use of accessory muscles, no dullness to percussion, clear without rales or rhonchi  Cardiovascular: RRR, heart sounds normal, no murmur or gallops, no peripheral edema  Abdomen: soft and NT,  no HSM,  BS normal  Musculoskeletal: No deformities, no cyanosis or clubbing  Neuro: alert, non focal  Skin: Warm, no lesions or rashes        Assessment & Plan:   No problem-specific assessment & plan notes found for this encounter.   Updated Medication List Outpatient Encounter Prescriptions as of 10/26/2014  Medication Sig  . amoxicillin (AMOXIL) 500 MG capsule As directed prior to dental appts  . aspirin 81 MG tablet Take 81 mg by mouth daily.  . beclomethasone (QVAR) 40 MCG/ACT inhaler Inhale 2 puffs into the lungs daily.  . Cetirizine HCl (ZYRTEC PO) Take 1 tablet by  mouth as needed.   . Cholecalciferol (VITAMIN D3) LIQD Take 4,000 Units by mouth daily.   . clobetasol (TEMOVATE) 0.05 % ointment As needed  . fluticasone (FLONASE) 50 MCG/ACT nasal spray Place 2 sprays into the nose Once daily as needed.  . irbesartan (AVAPRO) 300 MG tablet Take 300 mg by mouth daily.  . Multiple Vitamins-Minerals (ICAPS PO) Take 1 capsule by mouth daily.  . multivitamin (THERAGRAN) per tablet Take 1 tablet by mouth daily.    Marland Kitchen omega-3 acid ethyl esters (LOVAZA) 1 G capsule Take 2 g by mouth daily.  . pantoprazole (PROTONIX) 40 MG tablet Take 40 mg by mouth every other day.   Marland Kitchen PROAIR HFA 108 (90 BASE) MCG/ACT inhaler Inhale 2 puffs into the lungs every 6 (six) hours as needed.   . sotalol (BETAPACE) 80 MG tablet Take 80 mg by mouth 2 (two) times daily.    Marland Kitchen venlafaxine (EFFEXOR-XR) 75 MG 24 hr capsule Take 75 mg by mouth Daily.   . [DISCONTINUED] atorvastatin (LIPITOR) 10 MG tablet Take 10 mg by mouth daily.

## 2014-10-27 NOTE — Assessment & Plan Note (Signed)
Mod persistent asthma stable at present Plan No change in inhaled or maintenance medications. Return in  6 months

## 2015-02-01 ENCOUNTER — Encounter: Payer: Self-pay | Admitting: Cardiology

## 2015-02-21 ENCOUNTER — Other Ambulatory Visit: Payer: Self-pay | Admitting: Family Medicine

## 2015-02-21 ENCOUNTER — Ambulatory Visit (HOSPITAL_COMMUNITY)
Admission: RE | Admit: 2015-02-21 | Discharge: 2015-02-21 | Disposition: A | Discharge status: Home / Self Care | Payer: Commercial Managed Care - HMO | Source: Ambulatory Visit | Attending: Family Medicine | Admitting: Family Medicine

## 2015-02-21 DIAGNOSIS — K7689 Other specified diseases of liver: Secondary | ICD-10-CM | POA: Insufficient documentation

## 2015-02-21 DIAGNOSIS — R509 Fever, unspecified: Secondary | ICD-10-CM

## 2015-02-21 DIAGNOSIS — R945 Abnormal results of liver function studies: Secondary | ICD-10-CM

## 2015-02-27 ENCOUNTER — Emergency Department (HOSPITAL_BASED_OUTPATIENT_CLINIC_OR_DEPARTMENT_OTHER)
Admission: EM | Admit: 2015-02-27 | Discharge: 2015-02-28 | Disposition: A | Discharge status: Home / Self Care | Payer: Commercial Managed Care - HMO | Attending: Emergency Medicine | Admitting: Emergency Medicine

## 2015-02-27 ENCOUNTER — Encounter (HOSPITAL_BASED_OUTPATIENT_CLINIC_OR_DEPARTMENT_OTHER): Payer: Self-pay | Admitting: Emergency Medicine

## 2015-02-27 DIAGNOSIS — I4891 Unspecified atrial fibrillation: Secondary | ICD-10-CM | POA: Insufficient documentation

## 2015-02-27 DIAGNOSIS — R1110 Vomiting, unspecified: Secondary | ICD-10-CM | POA: Insufficient documentation

## 2015-02-27 DIAGNOSIS — Z79899 Other long term (current) drug therapy: Secondary | ICD-10-CM | POA: Insufficient documentation

## 2015-02-27 DIAGNOSIS — R509 Fever, unspecified: Secondary | ICD-10-CM | POA: Diagnosis not present

## 2015-02-27 DIAGNOSIS — E780 Pure hypercholesterolemia: Secondary | ICD-10-CM | POA: Insufficient documentation

## 2015-02-27 DIAGNOSIS — Z7982 Long term (current) use of aspirin: Secondary | ICD-10-CM | POA: Diagnosis not present

## 2015-02-27 DIAGNOSIS — I10 Essential (primary) hypertension: Secondary | ICD-10-CM | POA: Diagnosis not present

## 2015-02-27 DIAGNOSIS — Z7951 Long term (current) use of inhaled steroids: Secondary | ICD-10-CM | POA: Diagnosis not present

## 2015-02-27 DIAGNOSIS — Z87891 Personal history of nicotine dependence: Secondary | ICD-10-CM | POA: Insufficient documentation

## 2015-02-27 DIAGNOSIS — J45909 Unspecified asthma, uncomplicated: Secondary | ICD-10-CM | POA: Insufficient documentation

## 2015-02-27 DIAGNOSIS — M791 Myalgia, unspecified site: Secondary | ICD-10-CM

## 2015-02-27 DIAGNOSIS — Z8719 Personal history of other diseases of the digestive system: Secondary | ICD-10-CM | POA: Diagnosis not present

## 2015-02-27 DIAGNOSIS — R52 Pain, unspecified: Secondary | ICD-10-CM | POA: Diagnosis present

## 2015-02-27 LAB — URINE MICROSCOPIC-ADD ON

## 2015-02-27 LAB — CBC
HCT: 33 % — ABNORMAL LOW (ref 36.0–46.0)
HEMOGLOBIN: 10.5 g/dL — AB (ref 12.0–15.0)
MCH: 27.7 pg (ref 26.0–34.0)
MCHC: 31.8 g/dL (ref 30.0–36.0)
MCV: 87.1 fL (ref 78.0–100.0)
Platelets: 354 10*3/uL (ref 150–400)
RBC: 3.79 MIL/uL — AB (ref 3.87–5.11)
RDW: 14.9 % (ref 11.5–15.5)
WBC: 9.5 10*3/uL (ref 4.0–10.5)

## 2015-02-27 LAB — HEPATIC FUNCTION PANEL
ALBUMIN: 3.3 g/dL — AB (ref 3.5–5.0)
ALT: 43 U/L (ref 14–54)
AST: 25 U/L (ref 15–41)
Alkaline Phosphatase: 233 U/L — ABNORMAL HIGH (ref 38–126)
BILIRUBIN DIRECT: 0.2 mg/dL (ref 0.1–0.5)
BILIRUBIN INDIRECT: 0.6 mg/dL (ref 0.3–0.9)
BILIRUBIN TOTAL: 0.8 mg/dL (ref 0.3–1.2)
Total Protein: 7.9 g/dL (ref 6.5–8.1)

## 2015-02-27 LAB — BASIC METABOLIC PANEL
Anion gap: 10 (ref 5–15)
BUN: 14 mg/dL (ref 6–20)
CO2: 24 mmol/L (ref 22–32)
Calcium: 8.9 mg/dL (ref 8.9–10.3)
Chloride: 99 mmol/L — ABNORMAL LOW (ref 101–111)
Creatinine, Ser: 0.82 mg/dL (ref 0.44–1.00)
GFR calc Af Amer: >60 mL/min (ref >60)
Glucose, Bld: 127 mg/dL — ABNORMAL HIGH (ref 65–99)
POTASSIUM: 4.3 mmol/L (ref 3.5–5.1)
Sodium: 133 mmol/L — ABNORMAL LOW (ref 135–145)

## 2015-02-27 LAB — URINALYSIS, ROUTINE W REFLEX MICROSCOPIC
Bilirubin Urine: NEGATIVE
Glucose, UA: NEGATIVE mg/dL
Ketones, ur: NEGATIVE mg/dL
LEUKOCYTES UA: NEGATIVE
NITRITE: NEGATIVE
PROTEIN: NEGATIVE mg/dL
Specific Gravity, Urine: 1.029 (ref 1.005–1.030)
Urobilinogen, UA: 0.2 mg/dL (ref 0.0–1.0)
pH: 5.5 (ref 5.0–8.0)

## 2015-02-27 LAB — CK: Total CK: 28 U/L — ABNORMAL LOW (ref 38–234)

## 2015-02-27 LAB — SEDIMENTATION RATE: SED RATE: 120 mm/h — AB (ref 0–22)

## 2015-02-27 MED ORDER — SODIUM CHLORIDE 0.9 % IV BOLUS (SEPSIS)
1000.0000 mL | Freq: Once | INTRAVENOUS | Status: AC
  Administered 2015-02-27: 1000 mL via INTRAVENOUS

## 2015-02-27 MED ORDER — IBUPROFEN 400 MG PO TABS
400.0000 mg | ORAL_TABLET | Freq: Once | ORAL | Status: AC
  Administered 2015-02-27: 400 mg via ORAL
  Filled 2015-02-27: qty 1

## 2015-02-27 NOTE — ED Provider Notes (Signed)
CSN: [id number]     Arrival date & time 02/27/15  1954 History  This chart was scribed for Alfonzo Beers, MD by Julien Nordmann, ED Scribe. This patient was seen in room MH12/MH12 and the patient's care was started at 9:05 PM.    Chief Complaint  Patient presents with  . Fever  . Generalized Body Aches      The history is provided by the patient. No language interpreter was used.    HPI Comments: [name] [name] is a 69 y.o. female who presents to the Emergency Department complaining of constant, gradual worsening fever onset 2 weeks ago. Pt has associated weakness, and general body aches. Pt notes her pain gets so bad she is unable to turn over. Pt reports her thigh muscles and legs give her the most pain. Pt notes at the time her fever (TMAX 101.4). Pt was seen by PCP and was told to stop taking Tylenol and take ibuprofen instead. Pt notes she last took ibuprofen last night to alleviate the symptoms with no relief.  She has been seen x 2 by her PMD in the last week.  She states her LFTs were elevated on first visit and then increased- she had abdominal ultrasound which showed no gallstones- she denies vomiting or abdominal pain.  She had elevated ESR at approx 100 as well.  CK was normal.  She states her PMD is going to call ID tomorrow to discuss patient and next steps.  Pt came to the ED tonight at the urging of her coworkers with [employer] for further evaluation.  She states the ibuprofen helps her pain and she does not need anything further for discomfort at this time.  There are no other associated systemic symptoms, there are no other alleviating or modifying factors.   Past Medical History  Diagnosis Date  . Atrial fibrillation   . Hypercholesterolemia   . Asthma 1976  . Hepatitis 1980s  . Bronchitis   . HTN (hypertension)    Past Surgical History  Procedure Laterality Date  . Vaginal hysterectomy  1973  . Ovary surgery  2001  . Knee arthroplasty  2007    right  . Tubal ligation   1972  . Appendectomy  1973  . Foot surgery Left 2009   Family History  Problem Relation Age of Onset  . Hypertension Sister   . Multiple sclerosis Daughter   . Lung cancer Father   . Hypertension Mother   . Breast cancer      maternal aunt  . Allergies Daughter   . Allergies Daughter   . Allergies Son    History  Substance Use Topics  . Smoking status: Former Smoker -- 0.20 packs/day for 5 years    Types: Cigarettes    Quit date: 09/23/1980  . Smokeless tobacco: Never Used  . Alcohol Use: Yes     Comment: 1 glass 5-6 times weekly   OB History    No data available     Review of Systems  Gastrointestinal: Positive for vomiting.  Musculoskeletal: Positive for myalgias.  Neurological: Positive for weakness.  All other systems reviewed and are negative.     Allergies  Lipitor; Simvastatin; Cephalosporins; Nickel; and Sulfa antibiotics  Home Medications   Prior to Admission medications   Medication Sig Start Date End Date Taking? Authorizing Provider  bisacodyl (DULCOLAX) 5 MG EC tablet Take 5 mg by mouth daily as needed for moderate constipation.   Yes Historical Provider, MD  doxycycline (ORACEA) 40 MG  capsule Take 40 mg by mouth every morning. 02/23/15 03/01/15 Yes Historical Provider, MD  amoxicillin (AMOXIL) 500 MG capsule As directed prior to dental appts    Historical Provider, MD  aspirin 81 MG tablet Take 81 mg by mouth daily.    Historical Provider, MD  beclomethasone (QVAR) 40 MCG/ACT inhaler Inhale 2 puffs into the lungs daily. 02/18/12   Elsie Stain, MD  Cetirizine HCl (ZYRTEC PO) Take 1 tablet by mouth as needed.     Historical Provider, MD  Cholecalciferol (VITAMIN D3) LIQD Take 4,000 Units by mouth daily.     Historical Provider, MD  clobetasol (TEMOVATE) 0.05 % ointment As needed    Historical Provider, MD  fluticasone (FLONASE) 50 MCG/ACT nasal spray Place 2 sprays into the nose Once daily as needed.    Historical Provider, MD  irbesartan (AVAPRO) 300  MG tablet Take 300 mg by mouth daily.    Historical Provider, MD  Multiple Vitamins-Minerals (ICAPS PO) Take 1 capsule by mouth daily.    Historical Provider, MD  multivitamin [facility]) per tablet Take 1 tablet by mouth daily.      Historical Provider, MD  omega-3 acid ethyl esters (LOVAZA) 1 G capsule Take 2 g by mouth daily. 12/17/11   Elsie Stain, MD  pantoprazole (PROTONIX) 40 MG tablet Take 40 mg by mouth every other day.     Historical Provider, MD  PROAIR HFA 108 (90 BASE) MCG/ACT inhaler Inhale 2 puffs into the lungs every 6 (six) hours as needed.  03/06/11   Historical Provider, MD  sotalol (BETAPACE) 80 MG tablet Take 80 mg by mouth 2 (two) times daily.      Historical Provider, MD  venlafaxine (EFFEXOR-XR) 75 MG 24 hr capsule Take 75 mg by mouth Daily.  02/27/11   Historical Provider, MD   Triage vitals: BP 161/74 mmHg  Pulse 77  Temp(Src) 101.2 F (38.4 C) (Oral)  Resp 18  Ht 5' 3.5" (1.613 m)  Wt 180 lb (81.647 kg)  BMI 31.38 kg/m2  SpO2 98% Vitals reviewed Physical Exam  Physical Examination: General appearance - alert, well appearing, and in no distress Mental status - alert, oriented to person, place, and time Eyes - no conjunctival injection no scleral icterus Mouth - mucous membranes moist, pharynx normal without lesions Neck - supple, no significant adenopathy Chest - clear to auscultation, no wheezes, rales or rhonchi, symmetric air entry Heart - normal rate, regular rhythm, normal S1, S2, no murmurs, rubs, clicks or gallops Abdomen - soft, nontender, nondistended, no masses or organomegaly Neurological - alert, oriented x 3, cranial nerves grossly intact, no focal deficit Musculoskeletal - no joint tenderness, deformity or swelling, no warmth or erythema overlying joints Extremities - peripheral pulses normal, no pedal edema, no clubbing or cyanosis Skin - normal coloration and turgor, no rashes  ED Course  Procedures  DIAGNOSTIC STUDIES: Oxygen Saturation  is 98% on RA, normal by my interpretation.  COORDINATION OF CARE:  9:10 PM Discussed treatment plan which includes blood work, check CK, fluids with pt at bedside and pt agreed to plan.   Labs Review Labs Reviewed  URINALYSIS, ROUTINE W REFLEX MICROSCOPIC (NOT AT [facility]) - Abnormal; Notable for the following:    Color, Urine AMBER (*)    APPearance CLOUDY (*)    Hgb urine dipstick TRACE (*)    All other components within normal limits  CBC - Abnormal; Notable for the following:    RBC 3.79 (*)    Hemoglobin 10.5 (*)  HCT 33.0 (*)    All other components within normal limits  BASIC METABOLIC PANEL - Abnormal; Notable for the following:    Sodium 133 (*)    Chloride 99 (*)    Glucose, Bld 127 (*)    All other components within normal limits  HEPATIC FUNCTION PANEL - Abnormal; Notable for the following:    Albumin 3.3 (*)    Alkaline Phosphatase 233 (*)    All other components within normal limits  SEDIMENTATION RATE - Abnormal; Notable for the following:    Sed Rate 120 (*)    All other components within normal limits  CK - Abnormal; Notable for the following:    Total CK 28 (*)    All other components within normal limits  CULTURE, BLOOD (ROUTINE X 2)  CULTURE, BLOOD (ROUTINE X 2)  URINE CULTURE  URINE MICROSCOPIC-ADD ON    Imaging Review No results found.   EKG Interpretation None      MDM   Final diagnoses:  Febrile illness  Myalgia    Pt presenting with daily fever which has been ongoing for the past 2 weeks.  She also has leg pain associated.  She initially had elevated LFTs last week- these were reassuring in the ED tonight- alk phos elevated, but ast/alt normal.  Esr is increased at 120, was 100 last week.  Pt feels comfortable with discharge at this time.  Her doctor is planning to consult with ID tomorrow, i have advised that she f/u with rheumatology as well.  Blood cultures drawn- she had lyme titres/rmsf titres drawn by her doctor last week.  Discharged  with strict return precautions.  Pt agreeable with plan.  I personally performed the services described in this documentation, which was scribed in my presence. The recorded information has been reviewed and is accurate.   Alfonzo Beers, MD 02/28/15 2300

## 2015-02-27 NOTE — Discharge Instructions (Signed)
Return to the ED with any concerns including difficulty breathing, vomiting, worsening pain not controlled by home medications, decreased level of alertness/lethargy, or any other alarming symptoms  You should be sure to followup with your primary care doctor, Infectious disease as he recommends, and with rheumatology

## 2015-02-27 NOTE — ED Notes (Addendum)
69 yo with fever and generalized body aches x 2 weeks. States she has seen her PCP today for work up and thinks she needs to follow up with Infectious Disease Dr. Maryjane Hurter took Ibuprofen yesterday evening.

## 2015-03-01 LAB — URINE CULTURE
COLONY COUNT: NO GROWTH
CULTURE: NO GROWTH

## 2015-03-02 ENCOUNTER — Telehealth: Payer: Self-pay | Admitting: Infectious Disease

## 2015-03-02 NOTE — Telephone Encounter (Signed)
Received call from Dr. Elsworth Soho re tRN Alroy Dust  She has had FUO x 2 weeks to 103 with completely negative workup so far, Negative blood cultures, Negative CXR. High ESR, CRP  If there is no clinic ID MD available to see her I would like to have her scheduled with me tomorrow am preferably at 9am  Dr Dagmar Hait is faxing records to [place]

## 2015-03-02 NOTE — Telephone Encounter (Signed)
Patient coming in at 9 am per Dr. Tommy Medal

## 2015-03-02 NOTE — Telephone Encounter (Signed)
Excellent

## 2015-03-03 ENCOUNTER — Ambulatory Visit (INDEPENDENT_AMBULATORY_CARE_PROVIDER_SITE_OTHER): Payer: Commercial Managed Care - HMO | Admitting: Infectious Disease

## 2015-03-03 ENCOUNTER — Encounter: Payer: Self-pay | Admitting: Infectious Disease

## 2015-03-03 VITALS — BP 148/79 | HR 64 | Temp 98.9°F | Wt 181.0 lb

## 2015-03-03 DIAGNOSIS — M353 Polymyalgia rheumatica: Secondary | ICD-10-CM

## 2015-03-03 DIAGNOSIS — R509 Fever, unspecified: Secondary | ICD-10-CM | POA: Diagnosis not present

## 2015-03-03 DIAGNOSIS — M3320 Polymyositis, organ involvement unspecified: Secondary | ICD-10-CM

## 2015-03-03 DIAGNOSIS — M130 Polyarthritis, unspecified: Secondary | ICD-10-CM

## 2015-03-03 DIAGNOSIS — R61 Generalized hyperhidrosis: Secondary | ICD-10-CM | POA: Insufficient documentation

## 2015-03-03 DIAGNOSIS — K759 Inflammatory liver disease, unspecified: Secondary | ICD-10-CM | POA: Insufficient documentation

## 2015-03-03 HISTORY — DX: Generalized hyperhidrosis: R61

## 2015-03-03 HISTORY — DX: Fever, unspecified: R50.9

## 2015-03-03 HISTORY — DX: Polyarthritis, unspecified: M13.0

## 2015-03-03 HISTORY — DX: Polymyalgia rheumatica: M35.3

## 2015-03-03 HISTORY — DX: Polymyositis, organ involvement unspecified: M33.20

## 2015-03-03 NOTE — Progress Notes (Signed)
Subjective:    Patient ID: [name] [name], female    DOB: 04-26-1946, 69 y.o.   MRN: [id number]   Consult: FUO  Requesting MD: Dr. Dagmar Hait HPI  69 year old with hx of fevers since May 24th. Fevers are at night before going to bed. Sometimes during the day. She has extrreme muscle pain in arms, legs, calves and also joint pain in knees and elbows. Worst in right arm and right leg  Than left side.  She saw Dr. Dagmar Hait and saw NP on 31st and had blood work showing elevated LFT's.   Came back in on Thursday June 2nd. Lfts were up even more along with alk phosphatase. CK normal. Flu ag was negative. [place] was done and this was normal. Lyme, RMSF and hepatitis panel were done.  Doxycycline was started but patient did not improve and she is now done  with a course of therapy.   Pt then called Monday this week. Lab work done and LFTS coming down. Still feeling poorly and fevers to 102  Went to [facility]. Patient has been having drenching night sweats.   Pt is from [place], [place]. Moved to [facility].   Pt has travelled to [place], [place]. Has travelled to [place].  Pt had crown placed in May. A little bit of sensitivity.  There is no significiant rheumatological history in her family.  Review of Systems  Constitutional: Positive for fever, chills, diaphoresis, activity change and fatigue. Negative for appetite change and unexpected weight change.  HENT: Negative for congestion, rhinorrhea, sinus pressure, sneezing, sore throat and trouble swallowing.   Eyes: Negative for photophobia and visual disturbance.  Respiratory: Negative for cough, chest tightness, shortness of breath, wheezing and stridor.   Cardiovascular: Negative for chest pain, palpitations and leg swelling.  Gastrointestinal: Negative for nausea, vomiting, abdominal pain, diarrhea, constipation, blood in stool, abdominal distention and anal bleeding.  Genitourinary: Negative for dysuria, hematuria, flank pain and difficulty  urinating.  Musculoskeletal: Positive for myalgias, joint swelling and arthralgias. Negative for back pain and gait problem.  Skin: Negative for color change, pallor, rash and wound.  Neurological: Negative for dizziness, tremors, weakness and light-headedness.  Hematological: Negative for adenopathy. Does not bruise/bleed easily.  Psychiatric/Behavioral: Negative for behavioral problems, confusion, sleep disturbance, dysphoric mood, decreased concentration and agitation.       Objective:   Physical Exam  Constitutional: She is oriented to person, place, and time. She appears well-developed and well-nourished. No distress.  HENT:  Head: Normocephalic and atraumatic.  Mouth/Throat: Oropharynx is clear and moist. No oropharyngeal exudate.  Eyes: Conjunctivae and EOM are normal. Pupils are equal, round, and reactive to light. Right eye exhibits no discharge. Left eye exhibits no discharge. No scleral icterus.  Neck: Normal range of motion. Neck supple. No JVD present.  Cardiovascular: Normal rate, regular rhythm and normal heart sounds.  Exam reveals no gallop and no friction rub.   No murmur heard. Pulmonary/Chest: Effort normal and breath sounds normal. No respiratory distress. She has no wheezes. She has no rales. She exhibits no tenderness.  Abdominal: Soft. Bowel sounds are normal. She exhibits no distension. There is no tenderness. There is no rebound.  Musculoskeletal:       Right shoulder: She exhibits tenderness.       Left shoulder: She exhibits tenderness.  Patient had tenderness to palpation of her biceps, triceps muscles, shoulder joint  Pain with passive flexion around elbow joint  No effusions on either elbow  She has tenderness to palpation of 1st and 2nd Metacarpal joint. No clear cut synovial enlargement  She has bilateral tenderness to palpation of her thighs greater than calves  Knees without effusions  She has VISIBLE difficulty walking and getting onto and off  of the exam table   Neurological: She is alert and oriented to person, place, and time. She exhibits normal muscle tone. Coordination normal.  Skin: Skin is warm and dry. No rash noted. She is not diaphoretic. No erythema. No pallor.  Psychiatric: She has a normal mood and affect. Her behavior is normal. Judgment and thought content normal.          Assessment & Plan:   FUO with predominant symptoms besides fevers, being drenching night sweats myalgias and exam significant for muscle weakness, pain, tenderness in particular of proximal muscles also tenderness of MCP bilaterally  I have ordered labs for FUO workup. Unfortunately our phlebotomists were not here today due to no official clinics being scheduled and both being off today.  I ordered labs twice once as "clinic collect" as I normally do and then as Lab collect, but despite doing both of these the lab that she went to at [facility] apparently could not process the labs because they needed to be released.   By the time I found out this last piece of information I was in middle of a meeting and it was still during lunch break. I told her that neither myself nor my staff to my knowledge were familiar with "releasing labs" beyond my signing the orders in Epic and we do not deal with this issue normally because our own phlebotomists do all of our labs  I thought it was best if she comes back Monday for lab draw  I have ordered HIV antibody, EBV, CMV panels (she already had hepatitis panel as an outpatien), QF gold. She has had blood cultures at PCP and from ED  I have ordered CRP (ESR was already up), LDH, cryoglobulins, SPEP  I have ordered RF, ANA, anti DS antibody, CCP antibody, ferritin, ACE levels, SSA/SSB, anti-smooth muscle antibody  I have also ordered CT chest abdomen and pelvis with IV contrast.  I am highly suspicious that her FUO is a declaration of a CTD such as polymyositis, PMR or other rheumatological process.  I  suspect she will need referral to Rheumatologist. I told her I would be willing to give her a trial of steroids but I would prefer as the ID MD to have her CT scans done first to exclude occult infection.  I spent greater than 60 minutes with the patient including greater than 50% of time in face to face counsel of the patient regarding the causes of FUO and workup that we will pursue and in coordination of their care.   

## 2015-03-06 ENCOUNTER — Other Ambulatory Visit: Payer: Commercial Managed Care - HMO

## 2015-03-06 DIAGNOSIS — R509 Fever, unspecified: Secondary | ICD-10-CM

## 2015-03-06 DIAGNOSIS — M3320 Polymyositis, organ involvement unspecified: Secondary | ICD-10-CM

## 2015-03-06 DIAGNOSIS — M130 Polyarthritis, unspecified: Secondary | ICD-10-CM

## 2015-03-06 LAB — CULTURE, BLOOD (ROUTINE X 2)
CULTURE: NO GROWTH
Culture: NO GROWTH

## 2015-03-06 LAB — FERRITIN: FERRITIN: 375 ng/mL — AB (ref 10–291)

## 2015-03-06 LAB — C-REACTIVE PROTEIN: CRP: 11.7 mg/dL — ABNORMAL HIGH (ref <0.60)

## 2015-03-06 LAB — RHEUMATOID FACTOR: Rhuematoid fact SerPl-aCnc: <10 IU/mL (ref <=14)

## 2015-03-06 LAB — LACTATE DEHYDROGENASE: LDH: 129 U/L (ref 94–250)

## 2015-03-07 ENCOUNTER — Other Ambulatory Visit: Payer: Self-pay

## 2015-03-07 LAB — EPSTEIN-BARR VIRUS VCA ANTIBODY PANEL
EBV EA IgG: 58.3 U/mL — ABNORMAL HIGH (ref <9.0)
EBV NA IgG: 31.9 U/mL — ABNORMAL HIGH (ref <18.0)
EBV VCA IgG: 232 U/mL — ABNORMAL HIGH (ref <18.0)
EBV VCA IgM: <10 U/mL (ref <36.0)

## 2015-03-07 LAB — CYCLIC CITRUL PEPTIDE ANTIBODY, IGG

## 2015-03-07 LAB — ANGIOTENSIN CONVERTING ENZYME: Angiotensin-Converting Enzyme: 40 U/L (ref 8–52)

## 2015-03-07 LAB — HIV ANTIBODY (ROUTINE TESTING W REFLEX): HIV 1&2 Ab, 4th Generation: NONREACTIVE

## 2015-03-07 LAB — SJOGRENS SYNDROME-A EXTRACTABLE NUCLEAR ANTIBODY: SSA (Ro) (ENA) Antibody, IgG: <1

## 2015-03-07 LAB — ANTI-SMOOTH MUSCLE ANTIBODY, IGG: Smooth Muscle Ab: 8 U (ref <20)

## 2015-03-07 LAB — ANA: ANA: NEGATIVE

## 2015-03-07 LAB — ANTI-DNA ANTIBODY, DOUBLE-STRANDED: ds DNA Ab: <1 IU/mL

## 2015-03-07 LAB — SJOGRENS SYNDROME-B EXTRACTABLE NUCLEAR ANTIBODY: SSB (La) (ENA) Antibody, IgG: <1

## 2015-03-08 LAB — PROTEIN ELECTROPHORESIS, SERUM
Albumin ELP: 3.1 g/dL — ABNORMAL LOW (ref 3.8–4.8)
Alpha-1-Globulin: 0.7 g/dL — ABNORMAL HIGH (ref 0.2–0.3)
Alpha-2-Globulin: 1.2 g/dL — ABNORMAL HIGH (ref 0.5–0.9)
BETA GLOBULIN: 0.5 g/dL (ref 0.4–0.6)
Beta 2: 0.4 g/dL (ref 0.2–0.5)
Gamma Globulin: 1.3 g/dL (ref 0.8–1.7)
TOTAL PROTEIN, SERUM ELECTROPHOR: 7.2 g/dL (ref 6.1–8.1)

## 2015-03-08 LAB — QUANTIFERON TB GOLD ASSAY (BLOOD)
Interferon Gamma Release Assay: NEGATIVE
Mitogen value: 1.34 IU/mL
QUANTIFERON TB AG MINUS NIL: 0 [IU]/mL
Quantiferon Nil Value: 0.05 IU/mL
TB Ag value: 0.05 IU/mL

## 2015-03-09 ENCOUNTER — Ambulatory Visit (HOSPITAL_COMMUNITY)
Admission: RE | Admit: 2015-03-09 | Discharge: 2015-03-09 | Disposition: A | Discharge status: Home / Self Care | Payer: Commercial Managed Care - HMO | Source: Ambulatory Visit | Attending: Infectious Disease | Admitting: Infectious Disease

## 2015-03-09 DIAGNOSIS — M2550 Pain in unspecified joint: Secondary | ICD-10-CM | POA: Insufficient documentation

## 2015-03-09 DIAGNOSIS — R509 Fever, unspecified: Secondary | ICD-10-CM | POA: Insufficient documentation

## 2015-03-09 MED ORDER — IOHEXOL 300 MG/ML  SOLN
100.0000 mL | Freq: Once | INTRAMUSCULAR | Status: AC | PRN
  Administered 2015-03-09: 100 mL via INTRAVENOUS

## 2015-03-11 LAB — CRYOGLOBULIN

## 2015-03-15 ENCOUNTER — Encounter: Payer: Self-pay | Admitting: Infectious Disease

## 2015-03-15 ENCOUNTER — Ambulatory Visit (INDEPENDENT_AMBULATORY_CARE_PROVIDER_SITE_OTHER): Payer: Commercial Managed Care - HMO | Admitting: Infectious Disease

## 2015-03-15 VITALS — BP 121/75 | HR 54 | Temp 98.5°F | Wt 177.0 lb

## 2015-03-15 DIAGNOSIS — R509 Fever, unspecified: Secondary | ICD-10-CM | POA: Diagnosis not present

## 2015-03-15 DIAGNOSIS — M353 Polymyalgia rheumatica: Secondary | ICD-10-CM | POA: Diagnosis not present

## 2015-03-15 DIAGNOSIS — M130 Polyarthritis, unspecified: Secondary | ICD-10-CM

## 2015-03-15 HISTORY — DX: Polymyalgia rheumatica: M35.3

## 2015-03-15 MED ORDER — PREDNISONE 20 MG PO TABS: ORAL_TABLET | ORAL | Status: DC

## 2015-03-15 NOTE — Progress Notes (Signed)
Subjective:    Patient ID: [name] [name], female    DOB: 1946-03-24, 69 y.o.   MRN: [id number]   HPI   69 year old with hx of fevers since May 24th. Fevers are at night before going to bed. Sometimes during the day. She has extrreme muscle pain in arms, legs, calves and also joint pain in knees and elbows. Worst in right arm and right leg  Than left side.  She saw Dr. Dagmar Hait and saw NP on 31st and had blood work showing elevated LFT's.   Came back in on Thursday June 2nd. Lfts were up even more along with alk phosphatase. CK normal. Flu ag was negative. [place] was done and this was normal. Lyme, RMSF and hepatitis panel were done.  Doxycycline was started but patient did not improve and she is now done  with a course of therapy.   Pt then called Monday  Prior to initial visit with me. Lab work done and LFTS coming down. She was still feeling poorly and fevers to 102  Went to [facility]. Patient has been having drenching night sweats. Blood cultures were negative.  Pt is from [place], [place]. Moved to [facility].   Pt has travelled to [place], [place]. Has travelled to [place].  Pt had crown placed in May. A little bit of sensitivity.  There is no significiant rheumatological history in her family.  We did further FUO workup here.  Labs done here include serologies c/w past EBV infection, HIV negative QF gold negative. SPEP nonspecific. LDH negative, ferritin normal.   ANA negative, anti DNA, SSA/SSB, CK normal, RF negative.   CT abdomen and pelvis and chest unrevealing.  She continues to have proximal muscle pain, and weakness and tenderness on exam and has had high ESR c/w PMR.  Review of Systems  Constitutional: Positive for fever, chills, diaphoresis, activity change and fatigue. Negative for appetite change and unexpected weight change.  HENT: Negative for congestion, rhinorrhea, sinus pressure, sneezing, sore throat and trouble swallowing.   Eyes: Negative for  photophobia and visual disturbance.  Respiratory: Negative for cough, chest tightness, shortness of breath, wheezing and stridor.   Cardiovascular: Negative for chest pain, palpitations and leg swelling.  Gastrointestinal: Negative for nausea, vomiting, abdominal pain, diarrhea, constipation, blood in stool, abdominal distention and anal bleeding.  Genitourinary: Negative for dysuria, hematuria, flank pain and difficulty urinating.  Musculoskeletal: Positive for myalgias, joint swelling and arthralgias. Negative for back pain and gait problem.  Skin: Negative for color change, pallor, rash and wound.  Neurological: Negative for dizziness, tremors, weakness and light-headedness.  Hematological: Negative for adenopathy. Does not bruise/bleed easily.  Psychiatric/Behavioral: Negative for behavioral problems, confusion, sleep disturbance, dysphoric mood, decreased concentration and agitation.       Objective:   Physical Exam  Constitutional: She is oriented to person, place, and time. She appears well-developed and well-nourished. No distress.  HENT:  Head: Normocephalic and atraumatic.  Mouth/Throat: Oropharynx is clear and moist. No oropharyngeal exudate.  Eyes: Conjunctivae and EOM are normal. Pupils are equal, round, and reactive to light. Right eye exhibits no discharge. Left eye exhibits no discharge. No scleral icterus.  Neck: Normal range of motion. Neck supple. No JVD present.  Cardiovascular: Normal rate, regular rhythm and normal heart sounds.  Exam reveals no gallop and no friction rub.   No murmur heard. Pulmonary/Chest: Effort normal and breath sounds normal. No respiratory distress. She has no wheezes. She has no rales. She exhibits  no tenderness.  Abdominal: Soft. Bowel sounds are normal. She exhibits no distension. There is no tenderness. There is no rebound.  Musculoskeletal:       Right shoulder: She exhibits tenderness.       Left shoulder: She exhibits tenderness.    Patient had tenderness to palpation of her biceps, triceps muscles, shoulder joint  Pain with passive flexion around elbow joint  No effusions on either elbow  She has tenderness to palpation of 1st and 2nd Metacarpal joint. No clear cut synovial enlargement  She has bilateral tenderness to palpation of her thighs greater than calves  Knees without effusions  She has VISIBLE difficulty walking and getting onto and off of the exam table   Neurological: She is alert and oriented to person, place, and time. She exhibits normal muscle tone. Coordination normal.  Skin: Skin is warm and dry. No rash noted. She is not diaphoretic. No erythema. No pallor.  Psychiatric: She has a normal mood and affect. Her behavior is normal. Judgment and thought content normal.          Assessment & Plan:   FUO w symptoms, exam labs c/w PMR. We have ruled out serious infection with CT abdomen, pelvis, blood cultures HIV, hep panel, history  We will start on prednisone 53m x 5 days and then to 226m  I will refer her to Rheumatology with Dr. BeAmil AmenI spent greater than 25  minutes with the patient including greater than 50% of time in face to face counsel of the patient regarding PMR diagnosis and nature  of FUO  and in coordination of their care.

## 2015-03-28 ENCOUNTER — Telehealth: Payer: Self-pay | Admitting: Licensed Clinical Social Worker

## 2015-03-28 NOTE — Telephone Encounter (Signed)
I just texted Leigh Aurora to see if he could help out, did the steroids make her feel better?

## 2015-03-28 NOTE — Telephone Encounter (Signed)
Yes, she said she feels much better! She states that the steroids are a miracle!

## 2015-03-28 NOTE — Telephone Encounter (Signed)
Referral has been made to Dr. 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 Patient wants to know if Dr. Tommy Medal could help her get in sooner or at least hear back. I will try to call back also to see I could help.

## 2015-03-28 NOTE — Telephone Encounter (Signed)
Excellent. I spoke with Leigh Aurora and he is going to try to get the patient in tomorrow or Thursday. He also thinks this is PMR, fits the textbook. He is out of town the following week

## 2015-03-29 NOTE — Telephone Encounter (Signed)
[name] [name] did tell me he would work her in with him

## 2015-03-29 NOTE — Telephone Encounter (Signed)
Ok I actually sent another referral in case they did not get the first one, I hear that he and another provider are moving to their own Rheum. Practice.

## 2015-04-13 ENCOUNTER — Ambulatory Visit (INDEPENDENT_AMBULATORY_CARE_PROVIDER_SITE_OTHER): Payer: Commercial Managed Care - HMO | Admitting: Infectious Disease

## 2015-04-13 ENCOUNTER — Encounter: Payer: Self-pay | Admitting: Infectious Disease

## 2015-04-13 VITALS — BP 146/79 | HR 54 | Temp 98.1°F | Wt 178.0 lb

## 2015-04-13 DIAGNOSIS — M353 Polymyalgia rheumatica: Secondary | ICD-10-CM | POA: Diagnosis not present

## 2015-04-13 DIAGNOSIS — R509 Fever, unspecified: Secondary | ICD-10-CM

## 2015-04-13 NOTE — Progress Notes (Signed)
Subjective:    Patient ID: [name] [name], female    DOB: 1946-04-29, 69 y.o.   MRN: [id number]   HPI   69 year old with hx of fevers since May 24th. Fevers are at night before going to bed. Sometimes during the day. She has extrreme muscle pain in arms, legs, calves and also joint pain in knees and elbows. Worst in right arm and right leg  Than left side.  She saw Dr. Dagmar Hait and saw NP on 31st and had blood work showing elevated LFT's.   Came back in on Thursday June 2nd. Lfts were up even more along with alk phosphatase. CK normal. Flu ag was negative. [place] was done and this was normal. Lyme, RMSF and hepatitis panel were done.  Doxycycline was started but patient did not improve and she is now done  with a course of therapy.   Pt then called Monday  Prior to initial visit with me. Lab work done and LFTS coming down. She was still feeling poorly and fevers to 102  Went to [facility]. Patient has been having drenching night sweats. Blood cultures were negative.  Pt is from [place], [place]. Moved to [facility].   Pt has travelled to [place], [place]. Has travelled to [place].  Pt had crown placed in May. A little bit of sensitivity.  There is no significiant rheumatological history in her family.  We did further FUO workup here.  Labs done here include serologies c/w past EBV infection, HIV negative QF gold negative. SPEP nonspecific. LDH negative, ferritin normal.   ANA negative, anti DNA, SSA/SSB, CK normal, RF negative.   CT abdomen and pelvis and chest unrevealing.  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 Leigh Aurora who concurred with this diagnosis and has her on steroid taper. Her muscle pain is dramatically BETTER and she is back at work. ESR still a bit high she says. Other symptoms are gone.   Review of Systems  Constitutional: Negative for fever, chills, diaphoresis, activity change, appetite change, fatigue and unexpected  weight change.  HENT: Negative for congestion, rhinorrhea, sinus pressure, sneezing, sore throat and trouble swallowing.   Eyes: Negative for photophobia and visual disturbance.  Respiratory: Negative for cough, chest tightness, shortness of breath, wheezing and stridor.   Cardiovascular: Negative for chest pain, palpitations and leg swelling.  Gastrointestinal: Negative for nausea, vomiting, abdominal pain, diarrhea, constipation, blood in stool, abdominal distention and anal bleeding.  Genitourinary: Negative for dysuria, hematuria, flank pain and difficulty urinating.  Musculoskeletal: Positive for myalgias. Negative for back pain, joint swelling, arthralgias and gait problem.  Skin: Negative for color change, pallor, rash and wound.  Neurological: Negative for dizziness, tremors, weakness and light-headedness.  Hematological: Negative for adenopathy. Does not bruise/bleed easily.  Psychiatric/Behavioral: Negative for behavioral problems, confusion, sleep disturbance, dysphoric mood, decreased concentration and agitation.       Objective:   Physical Exam  Constitutional: She is oriented to person, place, and time. She appears well-developed and well-nourished. No distress.  HENT:  Head: Normocephalic and atraumatic.  Mouth/Throat: Oropharynx is clear and moist. No oropharyngeal exudate.  Eyes: Conjunctivae and EOM are normal. Pupils are equal, round, and reactive to light. Right eye exhibits no discharge. Left eye exhibits no discharge. No scleral icterus.  Neck: Normal range of motion. Neck supple. No JVD present.  Cardiovascular: Normal rate, regular rhythm and normal heart sounds.  Exam reveals no gallop  and no friction rub.   No murmur heard. Pulmonary/Chest: Effort normal and breath sounds normal. No respiratory distress. She has no wheezes. She has no rales. She exhibits no tenderness.  Abdominal: Soft. Bowel sounds are normal. She exhibits no distension. There is no tenderness.  There is no rebound.  Musculoskeletal:       Left shoulder: She exhibits tenderness.  Neurological: She is alert and oriented to person, place, and time. She exhibits normal muscle tone. Coordination normal.  Skin: Skin is warm and dry. No rash noted. She is not diaphoretic. No erythema. No pallor.  Psychiatric: She has a normal mood and affect. Her behavior is normal. Judgment and thought content normal.    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 RTC prn

## 2015-04-17 ENCOUNTER — Ambulatory Visit: Payer: Self-pay | Admitting: Infectious Disease

## 2015-04-24 ENCOUNTER — Encounter: Payer: Self-pay | Admitting: Critical Care Medicine

## 2015-04-24 ENCOUNTER — Ambulatory Visit (INDEPENDENT_AMBULATORY_CARE_PROVIDER_SITE_OTHER): Payer: Commercial Managed Care - HMO | Admitting: Critical Care Medicine

## 2015-04-24 VITALS — BP 150/86 | HR 78 | Temp 100.1°F | Ht 63.5 in | Wt 180.0 lb

## 2015-04-24 DIAGNOSIS — J4540 Moderate persistent asthma, uncomplicated: Secondary | ICD-10-CM | POA: Diagnosis not present

## 2015-04-24 MED ORDER — BECLOMETHASONE DIPROPIONATE 40 MCG/ACT IN AERS: INHALATION_SPRAY | RESPIRATORY_TRACT | Status: DC

## 2015-04-24 NOTE — Patient Instructions (Signed)
OK to hold Qvar until prednisone dose reduced to 5mg  daily then resume Return 6 months Dr [place] Bells

## 2015-04-24 NOTE — Progress Notes (Signed)
Subjective:    Patient ID: [name] [name], female    DOB: Oct 25, 1945, 69 y.o.   MRN: [id number]  HPI 04/24/2015 Chief Complaint  Patient presents with  . 6 month follow up    Breathing doing well overall.  No SOB, wheezing, chest tightness, CP, or cough at this time.    Pt dx with PMR and sees rheumatologic issue.  Pt became ill in 01/2015.  Fever daily all day/night, sweats, fatigue.  No infection found.  On prednisone empirically since 03/13/15: 40/d and now to 12.5 and worse then back up to 15mg  per day. Sees [facility].  Very slow taper.  Pt denies any significant sore throat, nasal congestion or excess secretions,pleurtic or exertional chest pain, orthopnea PND, or leg swelling Pt denies any increase in rescue therapy over baseline, denies waking up needing it or having any early am or nocturnal exacerbations of coughing/wheezing/or dyspnea. Pt also denies any obvious fluctuation in symptoms with  weather or environmental change or other alleviating or aggravating factors   Current Medications, Allergies, Complete Past Medical History, Past Surgical History, Family History, and Social History were reviewed in [place] [facility] record per todays encounter:  04/24/2015  Review of Systems  Constitutional: Positive for fatigue.  HENT: Negative.  Negative for ear pain, postnasal drip, rhinorrhea, sinus pressure, sore throat, trouble swallowing and voice change.   Eyes: Negative.   Respiratory: Negative.  Negative for apnea, cough, choking, chest tightness, shortness of breath, wheezing and stridor.   Cardiovascular: Negative.  Negative for chest pain, palpitations and leg swelling.  Gastrointestinal: Negative.  Negative for nausea, vomiting, abdominal pain and abdominal distention.  Genitourinary: Negative.   Musculoskeletal: Positive for myalgias, back pain, joint swelling, arthralgias, neck pain and neck stiffness.  Skin: Negative.  Negative for rash.    Allergic/Immunologic: Negative.  Negative for environmental allergies and food allergies.  Neurological: Positive for weakness. Negative for dizziness, syncope and headaches.  Hematological: Negative.  Negative for adenopathy. Does not bruise/bleed easily.  Psychiatric/Behavioral: Negative.  Negative for sleep disturbance and agitation. The patient is not nervous/anxious.        Objective:   Physical Exam Filed Vitals:   04/24/15 1129  BP: 150/86  Pulse: 78  Temp: 100.1 F (37.8 C)  TempSrc: Oral  Height: 5' 3.5" (1.613 m)  Weight: 180 lb (81.647 kg)  SpO2: 94%    Gen: Pleasant, well-nourished, in no distress,  normal affect  ENT: No lesions,  mouth clear,  oropharynx clear, no postnasal drip  Neck: No JVD, no TMG, no carotid bruits  Lungs: No use of accessory muscles, no dullness to percussion, clear without rales or rhonchi  Cardiovascular: RRR, heart sounds normal, no murmur or gallops, no peripheral edema  Abdomen: soft and NT, no HSM,  BS normal  Musculoskeletal: No deformities, no cyanosis or clubbing  Neuro: alert, non focal  Skin: Warm, no lesions or rashes  No results found.        Assessment & Plan:  I personally reviewed all images and lab data in the [facility] system as well as any outside material available during this office visit and agree with the  radiology impressions.   Asthma, moderate persistent Moderate persistent asthma stable now on oral prednisone being used for PMR.  Off qvar Plan Stay off qvar until prednisone dose is </= 5mg  per day. Ok to [employer] while on prednisone for DM monitoring    [name] [name] was seen today for 6 month follow up.  Diagnoses and all orders for this visit:  Asthma, moderate persistent, uncomplicated  Other orders -     beclomethasone (QVAR) 40 MCG/ACT inhaler; HOLD until prednisone at 5mg  daily then resume two puff twice daily

## 2015-04-25 NOTE — Assessment & Plan Note (Signed)
Moderate persistent asthma stable now on oral prednisone being used for PMR.  Off qvar Plan Stay off qvar until prednisone dose is </= 5mg  per day. Ok to [employer] while on prednisone for DM monitoring

## 2015-08-29 ENCOUNTER — Encounter: Payer: Self-pay | Admitting: Family Medicine

## 2015-08-29 ENCOUNTER — Ambulatory Visit (INDEPENDENT_AMBULATORY_CARE_PROVIDER_SITE_OTHER): Payer: Commercial Managed Care - HMO | Admitting: Family Medicine

## 2015-08-29 VITALS — BP 130/74 | HR 61 | Ht 63.5 in | Wt 190.0 lb

## 2015-08-29 DIAGNOSIS — M353 Polymyalgia rheumatica: Secondary | ICD-10-CM | POA: Diagnosis not present

## 2015-08-29 NOTE — Progress Notes (Signed)
[facility] [address] [place], [place] [address] Phone: [phone] Subjective:    I'm seeing this patient by the request  of:  Hoyle Sauer, MD   CC: Arthralgia's history of PMR  [id number] [name] [name] is a 69 y.o. female coming in with complaint of significant joint pain. Patient has had this problem for quite some time. Has seen many different specialists. Infectious disease has diagnosed her with more of a PMR. No signs of any infectious etiology. Patient even had workup including CT scans. Patient has had difficulty getting off her prednisone. Patient's last CRP was 28.4 and her ESR was 45. Had to go back up on her prednisone. Patient denies any fever, chills, or any abnormal weight loss. Patient states that she uses and so much pain she has not even been able to workout on a regular basis. Patient is looking for any type of other treatment options a could be beneficial for her PMR. Patient states most of the pain seems to be her axial skeleton including her shoulders and her lower back. Sometimes though her extremities also can cause significant amount of pain. Patient continues to take prednisone 10 mg daily. Makes activities of daily living more difficult. Does not stop her from any activities.  Past Medical History  Diagnosis Date  . Atrial fibrillation (HCC)   . Hypercholesterolemia   . Asthma 1976  . Hepatitis 1980s  . Bronchitis   . HTN (hypertension)   . Hepatitis   . FUO (fever of unknown origin) 03/03/2015  . Polymyositis (HCC) 03/03/2015  . 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 Polymyalgia (HCC) 03/03/2015  . Night sweat 03/03/2015  . PMR (polymyalgia rheumatica) (HCC) 03/15/2015   Past Surgical History  Procedure Laterality Date  . Vaginal hysterectomy  1973  . Ovary surgery  2001  . Knee arthroplasty  2007    right  . Tubal ligation  1972  . Appendectomy  1973  . Foot surgery Left 2009   Social History  Substance Use Topics  .  Smoking status: Former Smoker -- 0.20 packs/day for 5 years    Types: Cigarettes    Quit date: 09/23/1980  . Smokeless tobacco: Never Used  . Alcohol Use: Yes     Comment: 1 glass 5-6 times weekly   Allergies  Allergen Reactions  . Lipitor [Atorvastatin] Other (See Comments)    Muscle cramps severe   . Simvastatin Other (See Comments)    Severe muscle cramps   . Cephalosporins   . Nickel   . Sulfa Antibiotics     hives   Family History  Problem Relation Age of Onset  . Hypertension Sister   . Multiple sclerosis Daughter   . Lung cancer Father   . Hypertension Mother   . Breast cancer      maternal aunt  . Allergies Daughter   . Allergies Daughter   . Allergies Son         Past medical history, social, surgical and family history all reviewed in electronic medical record.   Review of Systems: No headache, visual changes, nausea, vomiting, diarrhea, constipation, dizziness, abdominal pain, skin rash, fevers, chills, night sweats, weight loss, swollen lymph nodes, chest pain, shortness of breath, mood changes. Positive for muscle aches, occasional joint swelling, and joint pains.  Objective Blood pressure 130/74, weight 190 lb (86.183 kg).  General: No apparent distress alert and oriented x3 mood and affect normal, dressed appropriately.  HEENT: Pupils equal, extraocular movements intact  Respiratory: Patient's speak in full sentences and does not appear short of breath  Cardiovascular: No lower extremity edema, non tender, no erythema  Skin: Warm dry intact with no signs of infection or rash on extremities or on axial skeleton.  Abdomen: Soft nontender  Neuro: Cranial nerves II through XII are intact, neurovascularly intact in all extremities with 2+ DTRs and 2+ pulses.  Lymph: No lymphadenopathy of posterior or anterior cervical chain or axillae bilaterally.  Gait normal with good balance and coordination.  MSK:  Non tender with full range of motion and good  stability and symmetric strength and tone of shoulders, elbows, wrist, hip, knee and ankles bilaterally. Mild arthritic changes of the hands. No swelling or effusion noted of any of the joints.    Impression and Recommendations:     This case required medical decision making of moderate complexity.

## 2015-08-29 NOTE — Assessment & Plan Note (Signed)
Discussed with patient at great length. We discussed prognosis and likely the precipitating factors. We discussed that the prednisone may be necessary and we may need some other prescription medications. Patient would like to have course. For this for a greater amount of time. Patient has had severe significant workup already including labs as well as imaging and I do not feel that further workup is necessary at this time. We discussed different treatment options. Patient wanted to continue with conservative therapy. We discussed different vitamins in great detail. We discussed vitamin D, iron, vitamin C, as well as turmeric including the possibility of side effects. Patient will see me a message if any of this does occur. Patient will try the slow changes. Patient was given an exercise prescription. Patient come back again in 4 weeks for further evaluation and treatment.

## 2015-08-29 NOTE — Patient Instructions (Signed)
Great to see you Vitamin D 4000 IU daily Turmeric 500mg  twice daily Tart cherry extract at night (puritan pure on [place] is a good choice) Iron 65mg  daily WITH vitamin C 500mg  daily Stay active!!!! The more you move the better.  If we are going to decrease the prednisone again start DHEA 50mg  daily for 1 month and then discontinue for 2 weeks. I will let you decide and discuss with [facility]. It is going to be a long process but we should make strides. Happy holidays! See me again in 4 weeks.

## 2015-08-29 NOTE — Progress Notes (Signed)
Pre visit review using our clinic review tool, if applicable. No additional management support is needed unless otherwise documented below in the visit note. 

## 2015-09-04 ENCOUNTER — Encounter: Payer: Self-pay | Admitting: Cardiology

## 2015-09-04 ENCOUNTER — Ambulatory Visit (INDEPENDENT_AMBULATORY_CARE_PROVIDER_SITE_OTHER): Payer: Commercial Managed Care - HMO | Admitting: Cardiology

## 2015-09-04 VITALS — BP 152/78 | HR 57 | Ht 64.0 in | Wt 189.9 lb

## 2015-09-04 DIAGNOSIS — I10 Essential (primary) hypertension: Secondary | ICD-10-CM

## 2015-09-04 DIAGNOSIS — I480 Paroxysmal atrial fibrillation: Secondary | ICD-10-CM | POA: Diagnosis not present

## 2015-09-04 DIAGNOSIS — E785 Hyperlipidemia, unspecified: Secondary | ICD-10-CM | POA: Diagnosis not present

## 2015-09-04 MED ORDER — CHLORTHALIDONE 25 MG PO TABS: 25.0000 mg | ORAL_TABLET | Freq: Every day | ORAL | Status: DC

## 2015-09-04 NOTE — Patient Instructions (Signed)
Switch HCTZ to chlorthalidone 25 mg daily  Continue your other therapy  I will see you in one year

## 2015-09-04 NOTE — Progress Notes (Signed)
[name] [name] Date of Birth: April 18, 1946   History of Present Illness: [name] [name] is seen for  followup. She has a history of atrial fibrillation that has been well controlled with sotalol since 2003. Prior use of Toprol caused her to be very fatigued and calcium channel blockers did not help. She states she has been feeling very well from a cardiac standpoint. No palpitations, CP, or SOB. 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 HCTZ was added for HTN. She really wants to get off steroids. She is followed by Rheumatology. Reports last CRP 29 and sed rate 45.   Current Outpatient Prescriptions on File Prior to Visit  Medication Sig Dispense Refill  . aspirin 81 MG tablet Take 81 mg by mouth daily.    . beclomethasone (QVAR) 40 MCG/ACT inhaler HOLD until prednisone at 5mg  daily then resume two puff twice daily 1 Inhaler   . Cetirizine HCl (ZYRTEC PO) Take 1 tablet by mouth as needed.     . Cholecalciferol (VITAMIN D3) LIQD Take 1,000 Units by mouth daily.     . clobetasol (TEMOVATE) 0.05 % ointment As needed    . fluticasone (FLONASE) 50 MCG/ACT nasal spray Place 2 sprays into the nose Once daily as needed.    . irbesartan (AVAPRO) 300 MG tablet Take 300 mg by mouth daily.    . Multiple Vitamins-Minerals (ICAPS PO) Take 1 capsule by mouth daily.    . multivitamin (THERAGRAN) per tablet Take 1 tablet by mouth daily.      Marland Kitchen omega-3 acid ethyl esters (LOVAZA) 1 G capsule Take 2 g by mouth daily.    . ondansetron (ZOFRAN-ODT) 4 MG disintegrating tablet Take 4 mg by mouth as needed.     . predniSONE (DELTASONE) 5 MG tablet Take 15 mg by mouth daily.    Marland Kitchen PROAIR HFA 108 (90 BASE) MCG/ACT inhaler Inhale 2 puffs into the lungs every 6 (six) hours as needed.     . sotalol (BETAPACE) 80 MG tablet Take 80 mg by mouth 2 (two) times daily.      Marland Kitchen venlafaxine (EFFEXOR-XR) 75 MG 24 hr capsule Take 75 mg by mouth Daily.      No current facility-administered medications on file prior to visit.     Allergies  Allergen Reactions  . Lipitor [Atorvastatin] Other (See Comments)    Muscle cramps severe   . Simvastatin Other (See Comments)    Severe muscle cramps   . Cephalosporins   . Nickel   . Sulfa Antibiotics     hives    Past Medical History  Diagnosis Date  . Atrial fibrillation ([place])   . Hypercholesterolemia   . Asthma 1976  . Hepatitis 1980s  . Bronchitis   . HTN (hypertension)   . Hepatitis   . FUO (fever of unknown origin) 03/03/2015  . Polymyositis ([place]) 03/03/2015  . 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 Polymyalgia ([place]) 03/03/2015  . Night sweat 03/03/2015  . PMR (polymyalgia rheumatica) ([place]) 03/15/2015    Past Surgical History  Procedure Laterality Date  . Vaginal hysterectomy  1973  . Ovary surgery  2001  . Knee arthroplasty  2007    right  . Tubal ligation  1972  . Appendectomy  1973  . Foot surgery Left 2009    History  Smoking status  . Former Smoker -- 0.20 packs/day for 5 years  . Types: Cigarettes  . Quit date: 09/23/1980  Smokeless tobacco  . Never Used    History  Alcohol Use  . Yes    Comment: 1 glass 5-6 times weekly    Family History  Problem Relation Age of Onset  . Hypertension Sister   . Multiple sclerosis Daughter   . Lung cancer Father   . Hypertension Mother   . Breast cancer      maternal aunt  . Allergies Daughter   . Allergies Daughter   . Allergies Son     Review of Systems: As noted in history of present illness.  All other systems were reviewed and are negative.  Physical Exam: BP 152/78 mmHg  Pulse 57  Ht 5\' 4"  (1.626 m)  Wt 86.138 kg (189 lb 14.4 oz)  BMI 32.58 kg/m2 She is a pleasant overweight white female in no acute distress. Her HEENT exam is unremarkable. She has no JVD or bruits. Lungs are clear. Cardiac exam reveals a regular rate and rhythm without gallop, murmur, or click. She has no edema. Pedal pulses are good.  LBORATORY DATA: Ecg today: NSR rate 57 bpm. Qtc 426 msec.Normal. I have  personally reviewed and interpreted this study.   Assessment / Plan: 1. Atrial fibrillation, well controlled on sotalol. We will continue with her current therapy. I will follow up in one year  2. Hypertension. Blood pressure is elevated. Recommend switching HCTZ for chlorthalidone 25 mg daily since data is more favorable.   3. PMR on steroids.

## 2015-09-26 ENCOUNTER — Encounter: Payer: Self-pay | Admitting: Family Medicine

## 2015-09-26 ENCOUNTER — Ambulatory Visit (INDEPENDENT_AMBULATORY_CARE_PROVIDER_SITE_OTHER): Payer: Commercial Managed Care - HMO | Admitting: Family Medicine

## 2015-09-26 VITALS — BP 110/72 | HR 68 | Ht 64.0 in | Wt 190.0 lb

## 2015-09-26 DIAGNOSIS — M353 Polymyalgia rheumatica: Secondary | ICD-10-CM

## 2015-09-26 NOTE — Patient Instructions (Addendum)
Great to see you [name] [name] is your friend if needed  I am impressed  No changes in your vitamins and hope we will get you off the prednisone Look at a diet hight in omega 3 foods and lower in omega 6 foods.  See me again in 6 weeks if any set back [place]!

## 2015-09-26 NOTE — Assessment & Plan Note (Signed)
She has been doing significantly better at this time. Patient does have a history of polymyalgia rheumatica. Patient at this point and continue with the conservative therapy. We did discuss different changes it could be beneficial. We discussed also continuing the certain vitamins. Patient will continue to see if she can decrease her prednisone but we did state that there is a possibility she will need a low dose at all time secondary to the amount of time she has been on the medicine. Patient and will come back and see me again in 6 weeks for further evaluation.  Spent  25 minutes with patient face-to-face and had greater than 50% of counseling including as described above in assessment and plan.

## 2015-09-26 NOTE — Progress Notes (Signed)
[facility] [address] [place], [place] [address] Phone: [phone] Subjective:      CC: Arthralgia's history of PMR f/u   XTG:GYIRSWNIOE HILJA [name] [name] is a 70 y.o. female coming in with complaint of significant joint pain. Patient has had this problem for quite some time. Has seen many different specialists. Infectious disease has diagnosed her with more of a PMR. No signs of any infectious etiology. Patient even had workup including CT scans. Patient has had difficulty getting off her prednisone. Patient's last CRP was 28.4 and her ESR was 45. Had to go back up on her prednisone.  Patient though did see me and started on certain vitamins. Patient has been decreasing the prednisone and is down to 8 mg daily. Patient is feeling better overall. States about 90% better. States that she is able to do more activity and has noticed more endurance. Not having any significant swelling of any of the joints. Sleeping comfortably. Very happy with the results.  Past Medical History  Diagnosis Date  . Atrial fibrillation ([place])   . Hypercholesterolemia   . Asthma 1976  . Hepatitis 1980s  . Bronchitis   . HTN (hypertension)   . Hepatitis   . FUO (fever of unknown origin) 03/03/2015  . Polymyositis ([place]) 03/03/2015  . 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 Polymyalgia ([place]) 03/03/2015  . Night sweat 03/03/2015  . PMR (polymyalgia rheumatica) ([place]) 03/15/2015   Past Surgical History  Procedure Laterality Date  . Vaginal hysterectomy  1973  . Ovary surgery  2001  . Knee arthroplasty  2007    right  . Tubal ligation  1972  . Appendectomy  1973  . Foot surgery Left 2009   Social History  Substance Use Topics  . Smoking status: Former Smoker -- 0.20 packs/day for 5 years    Types: Cigarettes    Quit date: 09/23/1980  . Smokeless tobacco: Never Used  . Alcohol Use: Yes     Comment: 1 glass 5-6 times weekly   Allergies  Allergen Reactions  . Lipitor [Atorvastatin]  Other (See Comments)    Muscle cramps severe   . Simvastatin Other (See Comments)    Severe muscle cramps   . Cephalosporins   . Nickel   . Sulfa Antibiotics     hives   Family History  Problem Relation Age of Onset  . Hypertension Sister   . Multiple sclerosis Daughter   . Lung cancer Father   . Hypertension Mother   . Breast cancer      maternal aunt  . Allergies Daughter   . Allergies Daughter   . Allergies Son         Past medical history, social, surgical and family history all reviewed in electronic medical record.   Review of Systems: No headache, visual changes, nausea, vomiting, diarrhea, constipation, dizziness, abdominal pain, skin rash, fevers, chills, night sweats, weight loss, swollen lymph nodes, chest pain, shortness of breath, mood changes. Positive for muscle aches, occasional joint swelling, and joint pains.  Objective Blood pressure 110/72, pulse 68, height '5\' 4"'$  (1.626 m), weight 190 lb (86.183 kg), SpO2 95 %.  General: No apparent distress alert and oriented x3 mood and affect normal, dressed appropriately.  HEENT: Pupils equal, extraocular movements intact  Respiratory: Patient's speak in full sentences and does not appear short of breath  Cardiovascular: No lower extremity edema, non tender, no erythema  Skin: Warm dry intact with no signs of infection or rash on extremities  or on axial skeleton.  Abdomen: Soft nontender  Neuro: Cranial nerves II through XII are intact, neurovascularly intact in all extremities with 2+ DTRs and 2+ pulses.  Lymph: No lymphadenopathy of posterior or anterior cervical chain or axillae bilaterally.  Gait normal with good balance and coordination.  MSK:  Non tender with full range of motion and good stability and symmetric strength and tone of shoulders, elbows, wrist, hip, knee and ankles bilaterally. Mild arthritic changes of the hands. No swelling or effusion noted of any of the joints. Nontender on exam.      Impression and Recommendations:     This case required medical decision making of moderate complexity.

## 2015-09-26 NOTE — Progress Notes (Signed)
Pre visit review using our clinic review tool, if applicable. No additional management support is needed unless otherwise documented below in the visit note. 

## 2015-10-26 ENCOUNTER — Encounter: Payer: Self-pay | Admitting: Family Medicine

## 2015-10-31 ENCOUNTER — Ambulatory Visit: Payer: Commercial Managed Care - HMO | Admitting: Pulmonary Disease

## 2015-11-07 ENCOUNTER — Ambulatory Visit: Payer: Self-pay | Admitting: Family Medicine

## 2016-01-10 ENCOUNTER — Ambulatory Visit: Payer: Commercial Managed Care - HMO | Admitting: Pulmonary Disease

## 2016-02-16 ENCOUNTER — Ambulatory Visit: Payer: Commercial Managed Care - HMO | Admitting: Pulmonary Disease

## 2016-03-04 ENCOUNTER — Other Ambulatory Visit: Payer: Self-pay

## 2016-03-04 MED ORDER — CHLORTHALIDONE 25 MG PO TABS: 25.0000 mg | ORAL_TABLET | Freq: Every day | ORAL | Status: DC

## 2016-03-05 ENCOUNTER — Encounter: Payer: Self-pay | Admitting: Pulmonary Disease

## 2016-03-05 ENCOUNTER — Ambulatory Visit (INDEPENDENT_AMBULATORY_CARE_PROVIDER_SITE_OTHER): Payer: Commercial Managed Care - HMO | Admitting: Pulmonary Disease

## 2016-03-05 VITALS — BP 130/68 | HR 55 | Ht 64.0 in | Wt 194.0 lb

## 2016-03-05 DIAGNOSIS — J4540 Moderate persistent asthma, uncomplicated: Secondary | ICD-10-CM

## 2016-03-05 NOTE — Patient Instructions (Signed)
When your prednisone dose is 5 mg daily, start taking Qvar 2 puffs twice a day Get a flu shot in the fall I will see you back in December

## 2016-03-05 NOTE — Assessment & Plan Note (Signed)
This has been a stable interval for Rhianna.  She is currently doing well with her current dose of prednisone and is not needing QVar.  Plan: Resume Qvar when prednisone dose reaches 5 mg daily Flu shot in the fall Follow-up 6 months

## 2016-03-05 NOTE — Progress Notes (Signed)
Subjective:    Patient ID: [name] [name], female    DOB: May 08, 1946, 70 y.o.   MRN: [id number]  Synopsis: Former patient of Dr. Joya Gaskins who has asthma. He noted in his workup that she had normal spirometry and he described her condition as reactive airways disease.  She used to smoke "socially" 1 pack per week when out with friends.  She quit altogether in 1982 after smoking 6-7.   HPI Chief Complaint  Patient presents with  . Follow-up    former [facility] pt being treated for asthma- states she has no breathing complaints at this time.    [name] [name] has asthnma.  She has not taken much QVar because she has been on prednisone regularly for PMR.  She typically will get a severe cough when she has an URI.  She was told years ago that she had exercise induced asthma.  It was adult onset, never required ER visit.  QVar > when taking it was 154mcg bid.    She sees Dr. Amil Amen for her PMR.  They plan to have her PMR to 5mg  daily by the end of the summer.Her PMR is well controlled right now.    Past Medical History  Diagnosis Date  . Atrial fibrillation ([place])   . Hypercholesterolemia   . Asthma 1976  . Hepatitis 1980s  . Bronchitis   . HTN (hypertension)   . Hepatitis   . FUO (fever of unknown origin) 03/03/2015  . Polymyositis ([place]) 03/03/2015  . 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 Polymyalgia ([employer]) 03/03/2015  . Night sweat 03/03/2015  . PMR (polymyalgia rheumatica) (HCC) 03/15/2015      Review of Systems     Objective:   Physical Exam  Filed Vitals:   03/05/16 1110  BP: 130/68  Pulse: 55  Height: 5\' 4"  (1.626 m)  Weight: 194 lb (87.998 kg)  SpO2: 97%    Gen: well appearing HENT: OP clear, TM's clear, neck supple PULM: CTA B, normal percussion CV: RRR, no mgr, trace edema GI: BS+, soft, nontender Derm: no cyanosis or rash Psyche: normal mood and affect      Assessment & Plan:  Asthma, moderate persistent This has been a stable interval for [name].  She is currently doing well with  her current dose of prednisone and is not needing QVar.  Plan: Resume Qvar when prednisone dose reaches 5 mg daily Flu shot in the fall Follow-up 6 months     Current outpatient prescriptions:  .  aspirin 81 MG tablet, Take 81 mg by mouth daily., Disp: , Rfl:  .  beclomethasone (QVAR) 40 MCG/ACT inhaler, HOLD until prednisone at 5mg  daily then resume two puff twice daily, Disp: 1 Inhaler, Rfl:  .  Cetirizine HCl (ZYRTEC PO), Take 1 tablet by mouth as needed. , Disp: , Rfl:  .  chlorthalidone (HYGROTON) 25 MG tablet, Take 1 tablet (25 mg total) by mouth daily., Disp: 90 tablet, Rfl: 3 .  Cholecalciferol (VITAMIN D3) LIQD, Take 1,000 Units by mouth daily. , Disp: , Rfl:  .  clobetasol (TEMOVATE) 0.05 % ointment, As needed, Disp: , Rfl:  .  DHEA 50 MG CAPS, Take 50 mg by mouth daily., Disp: , Rfl:  .  ferrous sulfate 325 (65 FE) MG tablet, Take 325 mg by mouth daily with breakfast., Disp: , Rfl:  .  fluticasone (FLONASE) 50 MCG/ACT nasal spray, Place 2 sprays into the nose Once daily as needed., Disp: , Rfl:  .  irbesartan (AVAPRO) 300 MG tablet, Take  300 mg by mouth daily., Disp: , Rfl:  .  Multiple Vitamins-Minerals (ICAPS PO), Take 1 capsule by mouth daily., Disp: , Rfl:  .  multivitamin (THERAGRAN) per tablet, Take 1 tablet by mouth daily.  , Disp: , Rfl:  .  omega-3 acid ethyl esters (LOVAZA) 1 G capsule, Take 2 g by mouth daily., Disp: , Rfl:  .  omeprazole (PRILOSEC) 20 MG capsule, Take 20 mg by mouth daily., Disp: , Rfl:  .  Omeprazole-Sodium Bicarbonate (ZEGERID) 20-1100 MG CAPS capsule, Take 1 capsule by mouth daily., Disp: , Rfl:  .  ondansetron (ZOFRAN-ODT) 4 MG disintegrating tablet, Take 4 mg by mouth as needed. , Disp: , Rfl:  .  predniSONE (DELTASONE) 5 MG tablet, Take 8 mg by mouth daily. , Disp: , Rfl:  .  PROAIR HFA 108 (90 BASE) MCG/ACT inhaler, Inhale 2 puffs into the lungs every 6 (six) hours as needed. , Disp: , Rfl:  .  sotalol (BETAPACE) 80 MG tablet, Take 80 mg by  mouth 2 (two) times daily.  , Disp: , Rfl:  .  Turmeric 500 MG CAPS, Take 500 mg by mouth 2 (two) times daily., Disp: , Rfl:  .  venlafaxine (EFFEXOR-XR) 75 MG 24 hr capsule, Take 75 mg by mouth Daily. , Disp: , Rfl:  .  vitamin C (ASCORBIC ACID) 500 MG tablet, Take 500 mg by mouth daily., Disp: , Rfl:

## 2016-03-06 ENCOUNTER — Other Ambulatory Visit: Payer: Self-pay | Admitting: Cardiology

## 2016-03-06 MED ORDER — CHLORTHALIDONE 25 MG PO TABS: 25.0000 mg | ORAL_TABLET | Freq: Every day | ORAL | Status: AC

## 2016-09-05 ENCOUNTER — Ambulatory Visit: Payer: Self-pay | Admitting: Pulmonary Disease

## 2016-09-09 ENCOUNTER — Ambulatory Visit: Payer: Self-pay | Admitting: Cardiology

## 2016-09-20 ENCOUNTER — Ambulatory Visit: Payer: Self-pay | Admitting: Pulmonary Disease

## 2016-09-26 ENCOUNTER — Ambulatory Visit: Payer: Self-pay | Admitting: Cardiology

## 2016-10-05 NOTE — Progress Notes (Deleted)
[name] [name] Date of Birth: Aug 20, 1946   History of Present Illness: [name] [name] is seen for  followup. She has a history of atrial fibrillation that has been well controlled with sotalol since 2003. Prior use of Toprol caused her to be very fatigued and calcium channel blockers did not help. She states she has been feeling very well from a cardiac standpoint. No palpitations, CP, or SOB. 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 HCTZ was added for HTN. She really wants to get off steroids. She is followed by Rheumatology. Reports last CRP 29 and sed rate 45. She is also followed by pulmonary for asthma.   Current Outpatient Prescriptions on File Prior to Visit  Medication Sig Dispense Refill  . aspirin 81 MG tablet Take 81 mg by mouth daily.    . beclomethasone (QVAR) 40 MCG/ACT inhaler HOLD until prednisone at 5mg  daily then resume two puff twice daily 1 Inhaler   . Cetirizine HCl (ZYRTEC PO) Take 1 tablet by mouth as needed.     . chlorthalidone (HYGROTON) 25 MG tablet Take 1 tablet (25 mg total) by mouth daily. 90 tablet 1  . Cholecalciferol (VITAMIN D3) LIQD Take 1,000 Units by mouth daily.     . clobetasol (TEMOVATE) 0.05 % ointment As needed    . DHEA 50 MG CAPS Take 50 mg by mouth daily.    . ferrous sulfate 325 (65 FE) MG tablet Take 325 mg by mouth daily with breakfast.    . fluticasone (FLONASE) 50 MCG/ACT nasal spray Place 2 sprays into the nose Once daily as needed.    . irbesartan (AVAPRO) 300 MG tablet Take 300 mg by mouth daily.    . Multiple Vitamins-Minerals (ICAPS PO) Take 1 capsule by mouth daily.    . multivitamin (THERAGRAN) per tablet Take 1 tablet by mouth daily.      Marland Kitchen omega-3 acid ethyl esters (LOVAZA) 1 G capsule Take 2 g by mouth daily.    Marland Kitchen omeprazole (PRILOSEC) 20 MG capsule Take 20 mg by mouth daily.    Earney Navy Bicarbonate (ZEGERID) 20-1100 MG CAPS capsule Take 1 capsule by mouth daily.    . ondansetron (ZOFRAN-ODT) 4 MG  disintegrating tablet Take 4 mg by mouth as needed.     . predniSONE (DELTASONE) 5 MG tablet Take 8 mg by mouth daily.     Marland Kitchen PROAIR HFA 108 (90 BASE) MCG/ACT inhaler Inhale 2 puffs into the lungs every 6 (six) hours as needed.     . sotalol (BETAPACE) 80 MG tablet Take 80 mg by mouth 2 (two) times daily.      . Turmeric 500 MG CAPS Take 500 mg by mouth 2 (two) times daily.    Marland Kitchen venlafaxine (EFFEXOR-XR) 75 MG 24 hr capsule Take 75 mg by mouth Daily.     . vitamin C (ASCORBIC ACID) 500 MG tablet Take 500 mg by mouth daily.     No current facility-administered medications on file prior to visit.     Allergies  Allergen Reactions  . Lipitor [Atorvastatin] Other (See Comments)    Muscle cramps severe   . Simvastatin Other (See Comments)    Severe muscle cramps   . Cephalosporins   . Nickel   . Sulfa Antibiotics     hives    Past Medical History:  Diagnosis Date  . Asthma 1976  . Atrial fibrillation ([place])   . Bronchitis   . FUO (fever of unknown origin) 03/03/2015  .  Hepatitis 1980s  . Hepatitis   . HTN (hypertension)   . Hypercholesterolemia   . Night sweat 03/03/2015  . PMR (polymyalgia rheumatica) ([place]) 03/15/2015  . 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 Polymyalgia ([place]) 03/03/2015  . Polymyositis ([place]) 03/03/2015    Past Surgical History:  Procedure Laterality Date  . APPENDECTOMY  1973  . FOOT SURGERY Left 2009  . KNEE ARTHROPLASTY  2007   right  . OVARY SURGERY  2001  . TUBAL LIGATION  1972  . VAGINAL HYSTERECTOMY  1973    History  Smoking Status  . Former Smoker  . Packs/day: 0.20  . Years: 5.00  . Types: Cigarettes  . Quit date: 09/23/1980  Smokeless Tobacco  . Never Used    History  Alcohol Use  . Yes    Comment: 1 glass 5-6 times weekly    Family History  Problem Relation Age of Onset  . Hypertension Sister   . Multiple sclerosis Daughter   . Lung cancer Father   . Hypertension Mother   . Breast cancer      maternal aunt  . Allergies Daughter   .  Allergies Daughter   . Allergies Son     Review of Systems: As noted in history of present illness.  All other systems were reviewed and are negative.  Physical Exam: There were no vitals taken for this visit. She is a pleasant overweight white female in no acute distress. Her HEENT exam is unremarkable. She has no JVD or bruits. Lungs are clear. Cardiac exam reveals a regular rate and rhythm without gallop, murmur, or click. She has no edema. Pedal pulses are good.  LBORATORY DATA: Ecg today: NSR rate 57 bpm. Qtc 426 msec.Normal. I have personally reviewed and interpreted this study.   Assessment / Plan: 1. Atrial fibrillation, well controlled on sotalol. We will continue with her current therapy. I will follow up in one year  2. Hypertension. Blood pressure is elevated. Recommend switching HCTZ for chlorthalidone 25 mg daily since data is more favorable.   3. PMR on steroids.

## 2016-10-08 ENCOUNTER — Other Ambulatory Visit: Payer: Self-pay | Admitting: *Deleted

## 2016-10-08 ENCOUNTER — Ambulatory Visit (INDEPENDENT_AMBULATORY_CARE_PROVIDER_SITE_OTHER): Payer: PPO | Admitting: Podiatry

## 2016-10-08 ENCOUNTER — Encounter: Payer: Self-pay | Admitting: Podiatry

## 2016-10-08 VITALS — BP 156/73 | HR 56 | Resp 16

## 2016-10-08 DIAGNOSIS — L600 Ingrowing nail: Secondary | ICD-10-CM

## 2016-10-08 DIAGNOSIS — Z7952 Long term (current) use of systemic steroids: Secondary | ICD-10-CM | POA: Diagnosis not present

## 2016-10-08 DIAGNOSIS — Z6834 Body mass index (BMI) 34.0-34.9, adult: Secondary | ICD-10-CM | POA: Diagnosis not present

## 2016-10-08 DIAGNOSIS — M353 Polymyalgia rheumatica: Secondary | ICD-10-CM | POA: Diagnosis not present

## 2016-10-08 DIAGNOSIS — E669 Obesity, unspecified: Secondary | ICD-10-CM | POA: Diagnosis not present

## 2016-10-08 MED ORDER — NEOMYCIN-POLYMYXIN-HC 1 % OT SOLN: OTIC | 1 refills | Status: DC

## 2016-10-08 NOTE — Patient Instructions (Signed)

## 2016-10-08 NOTE — Progress Notes (Signed)
   Subjective:    Patient ID: [name] [name], female    DOB: Apr 10, 1946, 71 y.o.   MRN: [id number]  HPI: She presents today with a chief complaint of painful hallux nails bilateral. She states that her toenails roll in and she has had matrixectomy performed before but they have described to the point where they need something done. She states and is tired of hurting all the time.    Review of Systems  Musculoskeletal: Positive for arthralgias.  Hematological: Bruises/bleeds easily.  All other systems reviewed and are negative.      Objective:   Physical Exam: Vital signs are stable she is alert and oriented 3. Pulses are palpable. Neurologic sensorium is intact. Deep tendon reflexes are intact. Muscle strength is 5 over 5 dorsiflexion plantar flexors and inverters everters all just musculature is intact. Orthopedic evaluation demonstrates all joints distal to the ankle for range of motion without crepitation. Cutaneous evaluation demonstrates sharp incurvated nail margins with onychocryptosis to the hallux bilateral. These are exquisitely tender on palpation with mild erythema surrounding the nail bed.          Assessment & Plan:  Assessment: Painful onychocryptosis ingrowing toenails hallux bilateral.  Plan: Chemical matrixectomy's were performed today total matrixectomy's were performed hallux bilateral after local anesthesia was achieved. She tolerated this procedure well without complications she was provided a prescription for Cortisporin Otic and was provided a handout on soaking her foot twice daily. I will follow up with her in 1-2 weeks.

## 2016-10-09 ENCOUNTER — Ambulatory Visit: Payer: Self-pay | Admitting: Pulmonary Disease

## 2016-10-10 ENCOUNTER — Ambulatory Visit: Payer: Self-pay | Admitting: Cardiology

## 2016-10-23 ENCOUNTER — Ambulatory Visit (INDEPENDENT_AMBULATORY_CARE_PROVIDER_SITE_OTHER): Payer: PPO | Admitting: Pulmonary Disease

## 2016-10-23 ENCOUNTER — Encounter: Payer: Self-pay | Admitting: Pulmonary Disease

## 2016-10-23 DIAGNOSIS — J309 Allergic rhinitis, unspecified: Secondary | ICD-10-CM | POA: Diagnosis not present

## 2016-10-23 DIAGNOSIS — J4540 Moderate persistent asthma, uncomplicated: Secondary | ICD-10-CM | POA: Diagnosis not present

## 2016-10-23 NOTE — Progress Notes (Signed)
Subjective:    Patient ID: [name] [name], female    DOB: [birth year], 71 y.o.   MRN: [id number]  Synopsis: Former patient of Dr. Joya Gaskins who has asthma. He noted in his workup that she had normal spirometry and he described her condition as reactive airways disease.  She used to smoke "socially" 1 pack per week when out with friends.  She quit altogether in 1982 after smoking 6-7.   HPI Chief Complaint  Patient presents with  . Follow-up    pt doing well, no breathing complaints today.    [name] remains on prednisone because she has been having severe muscle pain from her PMR.  She says her labs were OK with the exception of an elevated CRP.  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  NO flares, no albuterol use.  She has a cough at night from time to time after a recent cold.  Rare mucus production.     Past Medical History:  Diagnosis Date  . Asthma 1976  . Atrial fibrillation ([place])   . Bronchitis   . FUO (fever of unknown origin) 03/03/2015  . Hepatitis 1980s  . Hepatitis   . HTN (hypertension)   . Hypercholesterolemia   . Night sweat 03/03/2015  . PMR (polymyalgia rheumatica) ([place]) 03/15/2015  . 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 Polymyalgia ([place]) 03/03/2015  . Polymyositis ([place]) 03/03/2015      Review of Systems     Objective:   Physical Exam  Vitals:   10/23/16 1021  BP: 136/72  Pulse: (!) 54  SpO2: 97%  Weight: 194 lb (88 kg)  Height: 5\' 4"  (1.626 m)    Gen: well appearing HENT: OP clear, TM's clear, neck supple PULM: CTA B, normal percussion CV: RRR, no mgr, trace edema GI: BS+, soft, nontender Derm: no cyanosis or rash Psyche: normal mood and affect       Assessment & Plan:  Asthma, moderate persistent This has been a stable interval for [name]. She remains on prednisone for her polymyalgia rheumatica. In this situation she does not need an inhaled steroid. However, if they are able to get her off of an oral steroid this year then she  will start taking inhaled triamcinolone as this is covered on her formulary. Otherwise she will continue with albuterol as needed.  Allergic rhinitis She's had a little bit of a cough in the evenings which she thinks is due to allergic rhinitis. I agree. There is no role for adding a nasal steroid given her prednisone use. I advised that she use generic Zyrtec.    Current Outpatient Prescriptions:  .  aspirin 81 MG tablet, Take 81 mg by mouth daily., Disp: , Rfl:  .  beclomethasone (QVAR) 40 MCG/ACT inhaler, HOLD until prednisone at 5mg  daily then resume two puff twice daily, Disp: 1 Inhaler, Rfl:  .  Cetirizine HCl (ZYRTEC PO), Take 1 tablet by mouth as needed. , Disp: , Rfl:  .  chlorthalidone (HYGROTON) 25 MG tablet, Take 1 tablet (25 mg total) by mouth daily., Disp: 90 tablet, Rfl: 1 .  Cholecalciferol (VITAMIN D3) LIQD, Take 1,000 Units by mouth daily. , Disp: , Rfl:  .  clobetasol (TEMOVATE) 0.05 % ointment, As needed, Disp: , Rfl:  .  DHEA 50 MG CAPS, Take 50 mg by mouth daily., Disp: , Rfl:  .  estradiol (VIVELLE-DOT) 0.025 MG/24HR, Place 1 patch onto the skin 2 (two) times a week. , Disp: , Rfl:  .  ibuprofen (  ADVIL,MOTRIN) 800 MG tablet, Take 800 mg by mouth every 8 (eight) hours as needed. for pain, Disp: , Rfl: 0 .  irbesartan (AVAPRO) 300 MG tablet, Take 300 mg by mouth daily., Disp: , Rfl:  .  Multiple Vitamins-Minerals (ICAPS PO), Take 1 capsule by mouth daily., Disp: , Rfl:  .  multivitamin (THERAGRAN) per tablet, Take 1 tablet by mouth daily.  , Disp: , Rfl:  .  NEOMYCIN-POLYMYXIN-HYDROCORTISONE (CORTISPORIN) 1 % SOLN otic solution, Apply 1-2 drops to toe BID after soaking, Disp: 10 mL, Rfl: 1 .  omega-3 acid ethyl esters (LOVAZA) 1 G capsule, Take 2 g by mouth daily., Disp: , Rfl:  .  omeprazole (PRILOSEC) 20 MG capsule, Take 20 mg by mouth daily., Disp: , Rfl:  .  ondansetron (ZOFRAN-ODT) 4 MG disintegrating tablet, Take 4 mg by mouth as needed. , Disp: , Rfl:  .   predniSONE (DELTASONE) 5 MG tablet, Take 7 mg by mouth daily with breakfast., Disp: , Rfl:  .  PROAIR HFA 108 (90 BASE) MCG/ACT inhaler, Inhale 2 puffs into the lungs every 6 (six) hours as needed. , Disp: , Rfl:  .  sotalol (BETAPACE) 80 MG tablet, Take 80 mg by mouth 2 (two) times daily.  , Disp: , Rfl:  .  Turmeric 500 MG CAPS, Take 500 mg by mouth 2 (two) times daily., Disp: , Rfl:  .  venlafaxine (EFFEXOR-XR) 75 MG 24 hr capsule, Take 75 mg by mouth Daily. , Disp: , Rfl:  .  vitamin C (ASCORBIC ACID) 500 MG tablet, Take 500 mg by mouth daily., Disp: , Rfl:

## 2016-10-23 NOTE — Patient Instructions (Signed)
Take generic Zyrtec as needed for allergic rhinitis related cough Let us know if you are able to stop prednisone this year. If so I will prescribe inhaled triamcinolone for your asthma. I will see you back in 6 months or sooner if needed

## 2016-10-23 NOTE — Assessment & Plan Note (Signed)
This has been a stable interval for [name] [name]. She remains on prednisone for her polymyalgia rheumatica. In this situation she does not need an inhaled steroid. However, if they are able to get her off of an oral steroid this year then she will start taking inhaled triamcinolone as this is covered on her formulary. Otherwise she will continue with albuterol as needed.

## 2016-10-23 NOTE — Assessment & Plan Note (Signed)
She's had a little bit of a cough in the evenings which she thinks is due to allergic rhinitis. I agree. There is no role for adding a nasal steroid given her prednisone use. I advised that she use generic Zyrtec.

## 2016-10-29 ENCOUNTER — Ambulatory Visit (INDEPENDENT_AMBULATORY_CARE_PROVIDER_SITE_OTHER): Payer: Self-pay | Admitting: Podiatry

## 2016-10-29 ENCOUNTER — Encounter: Payer: Self-pay | Admitting: Podiatry

## 2016-10-29 DIAGNOSIS — M2042 Other hammer toe(s) (acquired), left foot: Secondary | ICD-10-CM

## 2016-10-29 DIAGNOSIS — L600 Ingrowing nail: Secondary | ICD-10-CM

## 2016-10-29 NOTE — Patient Instructions (Signed)

## 2016-10-29 NOTE — Progress Notes (Signed)
She presents today for follow-up of her bilateral matrixectomy's. She states that she is doing very well and has no problems. She continues to soak in Betadine and water.  Objective: Vital signs are stable to the alert and oriented 3. Pulses are palpable. Neurologic sensorium is intact. Surgical sites appear to be going on to heal uneventfully no signs of infection in that there is no erythema cellulitis drainage or odor. No pain on palpation. She does have hammertoe deformities with contracted metatarsophalangeal joints and extensor tendons.  Assessment: Well-healing surgical toes hallux bilateral. Hammertoe deformity left.  Plan: Discontinue Betadine sterile Epsom salts and warm water soaks continues opened completely epithelialized. Covered during the daytime leave open at bedtime. I also recommended that she come in at her earliest convenience for single tenotomy is third and fourth digits left foot.

## 2016-10-30 NOTE — Progress Notes (Signed)
[name] [name] Date of Birth: [birth year]   History of Present Illness: [name] [name] is seen for  followup. She has a history of atrial fibrillation that has been well controlled with sotalol since 2003. Prior use of Toprol caused her to be very fatigued and calcium channel blockers did not help. She states she has been feeling very well from a cardiac standpoint. No palpitations, CP, or SOB. She does have PMR and is on chronic steroids. She is more active and going to gym.  Current Outpatient Prescriptions on File Prior to Visit  Medication Sig Dispense Refill  . beclomethasone (QVAR) 40 MCG/ACT inhaler HOLD until prednisone at 5mg  daily then resume two puff twice daily 1 Inhaler   . Cetirizine HCl (ZYRTEC PO) Take 1 tablet by mouth as needed.     . chlorthalidone (HYGROTON) 25 MG tablet Take 1 tablet (25 mg total) by mouth daily. 90 tablet 1  . Cholecalciferol (VITAMIN D3) LIQD Take 1,000 Units by mouth daily.     . clobetasol (TEMOVATE) 0.05 % ointment As needed    . DHEA 50 MG CAPS Take 50 mg by mouth daily.    Marland Kitchen estradiol (VIVELLE-DOT) 0.025 MG/24HR Place 1 patch onto the skin 2 (two) times a week.     Marland Kitchen ibuprofen (ADVIL,MOTRIN) 800 MG tablet Take 800 mg by mouth every 8 (eight) hours as needed. for pain  0  . irbesartan (AVAPRO) 300 MG tablet Take 300 mg by mouth daily.    . Multiple Vitamins-Minerals (ICAPS PO) Take 1 capsule by mouth daily.    . multivitamin (THERAGRAN) per tablet Take 1 tablet by mouth daily.      . NEOMYCIN-POLYMYXIN-HYDROCORTISONE (CORTISPORIN) 1 % SOLN otic solution Apply 1-2 drops to toe BID after soaking 10 mL 1  . omega-3 acid ethyl esters (LOVAZA) 1 G capsule Take 2 g by mouth daily.    Marland Kitchen omeprazole (PRILOSEC) 20 MG capsule Take 20 mg by mouth daily.    . ondansetron (ZOFRAN-ODT) 4 MG disintegrating tablet Take 4 mg by mouth as needed.     . predniSONE (DELTASONE) 5 MG tablet Take 7 mg by mouth daily with breakfast.    . PROAIR HFA 108 (90 BASE) MCG/ACT inhaler  Inhale 2 puffs into the lungs every 6 (six) hours as needed.     . sotalol (BETAPACE) 80 MG tablet Take 80 mg by mouth 2 (two) times daily.      . Turmeric 500 MG CAPS Take 500 mg by mouth 2 (two) times daily.    Marland Kitchen venlafaxine (EFFEXOR-XR) 75 MG 24 hr capsule Take 75 mg by mouth Daily.     . vitamin C (ASCORBIC ACID) 500 MG tablet Take 500 mg by mouth daily.     No current facility-administered medications on file prior to visit.     Allergies  Allergen Reactions  . Lipitor [Atorvastatin] Other (See Comments)    Muscle cramps severe   . Simvastatin Other (See Comments)    Severe muscle cramps   . Cephalosporins   . Nickel   . Sulfa Antibiotics     hives    Past Medical History:  Diagnosis Date  . Asthma 1976  . Atrial fibrillation ([place])   . Bronchitis   . FUO (fever of unknown origin) 03/03/2015  . Hepatitis 1980s  . Hepatitis   . HTN (hypertension)   . Hypercholesterolemia   . Night sweat 03/03/2015  . PMR (polymyalgia rheumatica) ([place]) 03/15/2015  . 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 Polymyalgia (  [place]) 03/03/2015  . Polymyositis ([place]) 03/03/2015    Past Surgical History:  Procedure Laterality Date  . APPENDECTOMY  1973  . FOOT SURGERY Left 2009  . KNEE ARTHROPLASTY  2007   right  . OVARY SURGERY  2001  . TUBAL LIGATION  1972  . VAGINAL HYSTERECTOMY  1973    History  Smoking Status  . Former Smoker  . Packs/day: 0.20  . Years: 5.00  . Types: Cigarettes  . Quit date: 09/23/1980  Smokeless Tobacco  . Never Used    History  Alcohol Use  . Yes    Comment: 1 glass 5-6 times weekly    Family History  Problem Relation Age of Onset  . Hypertension Sister   . Multiple sclerosis Daughter   . Lung cancer Father   . Hypertension Mother   . Breast cancer      maternal aunt  . Allergies Daughter   . Allergies Daughter   . Allergies Son     Review of Systems: As noted in history of present illness.  All other systems were reviewed and are negative.  Physical  Exam: BP 130/80   Pulse (!) 56   Ht 5\' 4"  (1.626 m)   Wt 194 lb 6.4 oz (88.2 kg)   BMI 33.37 kg/m  She is a pleasant overweight white female in no acute distress. Her HEENT exam is unremarkable. She has no JVD or bruits. Lungs are clear. Cardiac exam reveals a regular rate and rhythm without gallop, murmur, or click. She has no edema. Pedal pulses are good.  LBORATORY DATA:  Lab Results  Component Value Date   WBC 9.5 02/27/2015   HGB 10.5 (L) 02/27/2015   HCT 33.0 (L) 02/27/2015   PLT 354 02/27/2015   GLUCOSE 127 (H) 02/27/2015   ALT 43 02/27/2015   AST 25 02/27/2015   NA 133 (L) 02/27/2015   K 4.3 02/27/2015   CL 99 (L) 02/27/2015   CREATININE 0.82 02/27/2015   BUN 14 02/27/2015   CO2 24 02/27/2015   Labs dated 02/06/16: cholesterol 236, triglycerides 132, HDL 69, LDL 141. CMET and TSH normal.  November 27/2017: A1c 5.6%.  Ecg today: NSR rate 57 bpm. Qtc 434 msec. T wave inversion in the anterior leads. I have personally reviewed and interpreted this study.   Assessment / Plan: 1. Atrial fibrillation, well controlled on sotalol. She has a [place] Vasc score of 4. Although she does not have clear Afib episodes I think she should be on anticoagulant therapy to reduce her risk of CVA. We discussed options and bleeding risk. She is agreeable to starting Xarelto 20 mg daily. Stop ASA. Avoid NSAIDs. Continue Sotalol.   2. Hypertension. Blood pressure is well controlled.   3. PMR on steroids.

## 2016-11-01 ENCOUNTER — Ambulatory Visit (INDEPENDENT_AMBULATORY_CARE_PROVIDER_SITE_OTHER): Payer: PPO | Admitting: Cardiology

## 2016-11-01 ENCOUNTER — Encounter: Payer: Self-pay | Admitting: Cardiology

## 2016-11-01 VITALS — BP 130/80 | HR 56 | Ht 64.0 in | Wt 194.4 lb

## 2016-11-01 DIAGNOSIS — E7800 Pure hypercholesterolemia, unspecified: Secondary | ICD-10-CM | POA: Diagnosis not present

## 2016-11-01 DIAGNOSIS — I480 Paroxysmal atrial fibrillation: Secondary | ICD-10-CM | POA: Diagnosis not present

## 2016-11-01 MED ORDER — RIVAROXABAN 10 MG PO TABS: 20.0000 mg | ORAL_TABLET | Freq: Every day | ORAL | Status: DC

## 2016-11-01 NOTE — Patient Instructions (Addendum)
Stop ASA  Start Xarelto 20 mg daily  Continue your other therapy  I will see you in one year

## 2016-11-06 ENCOUNTER — Other Ambulatory Visit: Payer: Self-pay | Admitting: Pharmacist Clinician (PhC)/ Clinical Pharmacy Specialist

## 2016-11-06 DIAGNOSIS — Z124 Encounter for screening for malignant neoplasm of cervix: Secondary | ICD-10-CM | POA: Diagnosis not present

## 2016-11-06 DIAGNOSIS — Z6833 Body mass index (BMI) 33.0-33.9, adult: Secondary | ICD-10-CM | POA: Diagnosis not present

## 2016-11-06 DIAGNOSIS — Z01419 Encounter for gynecological examination (general) (routine) without abnormal findings: Secondary | ICD-10-CM | POA: Diagnosis not present

## 2016-11-06 MED ORDER — RIVAROXABAN 20 MG PO TABS
20.0000 mg | ORAL_TABLET | Freq: Every day | ORAL | 5 refills | Status: DC
Start: 2016-11-06 — End: 2017-04-30

## 2016-11-12 ENCOUNTER — Encounter: Payer: Self-pay | Admitting: Podiatry

## 2016-11-12 ENCOUNTER — Ambulatory Visit (INDEPENDENT_AMBULATORY_CARE_PROVIDER_SITE_OTHER): Payer: PPO | Admitting: Podiatry

## 2016-11-12 DIAGNOSIS — L600 Ingrowing nail: Secondary | ICD-10-CM

## 2016-11-12 NOTE — Progress Notes (Signed)
She presents today concerned about her matrixectomy hallux bilaterally. She states is able to do so well and a single little bit weepy she states that she's been trying to soak them for last couple of days and they feel fine.  Objective: Vital signs are stable alert and oriented 3 no granulation tissue no erythema cellulitis drainage or odor is slightly macerated more than likely due to either over soaking or wearing bandages all of the time.  Assessment: Well-healing surgical toes hallux bilateral non-complicated.  Plan: I recommend that she soak in Epsom salts every other day and only cover them during the daytime. I will follow-up with her in the next few weeks if necessary otherwise follow-up with me as needed.

## 2016-11-29 ENCOUNTER — Telehealth: Payer: Self-pay | Admitting: *Deleted

## 2016-11-29 NOTE — Telephone Encounter (Addendum)
Pt states she is to have a surgical procedure in office 12/05/2016 and needs to find out if she is to be off of her Xarelto. 12/03/2016-I spoke with pt's husband, and informed of Dr. Stephenie Acres orders, and I requested a callback confirming pt understands.

## 2016-11-30 NOTE — Telephone Encounter (Signed)
What is she having done?  Yes generally if it is more than a toe nail.. Most of the time a toe nail is no problem. But otherwise I think she should stop 3 days in advance.

## 2016-12-03 NOTE — Telephone Encounter (Signed)
yeh she should probably be off the blood thinner for about three days.

## 2016-12-05 ENCOUNTER — Ambulatory Visit: Payer: PPO | Admitting: Podiatry

## 2016-12-31 DIAGNOSIS — L219 Seborrheic dermatitis, unspecified: Secondary | ICD-10-CM | POA: Diagnosis not present

## 2016-12-31 DIAGNOSIS — D229 Melanocytic nevi, unspecified: Secondary | ICD-10-CM | POA: Diagnosis not present

## 2017-01-17 DIAGNOSIS — Z7952 Long term (current) use of systemic steroids: Secondary | ICD-10-CM | POA: Diagnosis not present

## 2017-01-17 DIAGNOSIS — M353 Polymyalgia rheumatica: Secondary | ICD-10-CM | POA: Diagnosis not present

## 2017-01-17 DIAGNOSIS — Z6834 Body mass index (BMI) 34.0-34.9, adult: Secondary | ICD-10-CM | POA: Diagnosis not present

## 2017-01-31 ENCOUNTER — Telehealth: Payer: Self-pay | Admitting: Cardiology

## 2017-01-31 NOTE — Telephone Encounter (Signed)
Pt.notified

## 2017-01-31 NOTE — Telephone Encounter (Signed)
Left msg to call.  Per protocol, we do not indicate holding xarelto for routine cleaning.

## 2017-01-31 NOTE — Telephone Encounter (Signed)
New message    Pt c/o medication issue:  1. Name of Medication: rivaroxaban (XARELTO) tablet 20 mg  2. How are you currently taking this medication (dosage and times per day)? 20MG   3. Are you having a reaction (difficulty breathing--STAT)? NO  4. What is your medication issue? Pt is having a dental cleaning next week and wants to know if it will be okay to take Xarelto. Requests a call back

## 2017-01-31 NOTE — Telephone Encounter (Signed)
Follow Up ° ° ° °Returning call from earlier. Please call. °

## 2017-01-31 NOTE — Telephone Encounter (Signed)
Correct.  We do not hold xarelto for routine cleanings.

## 2017-02-12 DIAGNOSIS — M859 Disorder of bone density and structure, unspecified: Secondary | ICD-10-CM | POA: Diagnosis not present

## 2017-02-12 DIAGNOSIS — I10 Essential (primary) hypertension: Secondary | ICD-10-CM | POA: Diagnosis not present

## 2017-02-12 DIAGNOSIS — E119 Type 2 diabetes mellitus without complications: Secondary | ICD-10-CM | POA: Diagnosis not present

## 2017-02-12 DIAGNOSIS — R8299 Other abnormal findings in urine: Secondary | ICD-10-CM | POA: Diagnosis not present

## 2017-02-12 DIAGNOSIS — E784 Other hyperlipidemia: Secondary | ICD-10-CM | POA: Diagnosis not present

## 2017-02-19 DIAGNOSIS — I10 Essential (primary) hypertension: Secondary | ICD-10-CM | POA: Diagnosis not present

## 2017-02-19 DIAGNOSIS — M8580 Other specified disorders of bone density and structure, unspecified site: Secondary | ICD-10-CM | POA: Diagnosis not present

## 2017-02-19 DIAGNOSIS — M353 Polymyalgia rheumatica: Secondary | ICD-10-CM | POA: Diagnosis not present

## 2017-02-19 DIAGNOSIS — Z1389 Encounter for screening for other disorder: Secondary | ICD-10-CM | POA: Diagnosis not present

## 2017-02-19 DIAGNOSIS — I480 Paroxysmal atrial fibrillation: Secondary | ICD-10-CM | POA: Diagnosis not present

## 2017-02-19 DIAGNOSIS — E784 Other hyperlipidemia: Secondary | ICD-10-CM | POA: Diagnosis not present

## 2017-02-19 DIAGNOSIS — K635 Polyp of colon: Secondary | ICD-10-CM | POA: Diagnosis not present

## 2017-02-19 DIAGNOSIS — Z0000 Encounter for general adult medical examination without abnormal findings: Secondary | ICD-10-CM | POA: Diagnosis not present

## 2017-02-19 DIAGNOSIS — J45909 Unspecified asthma, uncomplicated: Secondary | ICD-10-CM | POA: Diagnosis not present

## 2017-02-19 DIAGNOSIS — Z6834 Body mass index (BMI) 34.0-34.9, adult: Secondary | ICD-10-CM | POA: Diagnosis not present

## 2017-02-19 DIAGNOSIS — R5383 Other fatigue: Secondary | ICD-10-CM | POA: Diagnosis not present

## 2017-02-19 DIAGNOSIS — E119 Type 2 diabetes mellitus without complications: Secondary | ICD-10-CM | POA: Diagnosis not present

## 2017-04-07 DIAGNOSIS — M779 Enthesopathy, unspecified: Secondary | ICD-10-CM | POA: Diagnosis not present

## 2017-04-07 DIAGNOSIS — Z6834 Body mass index (BMI) 34.0-34.9, adult: Secondary | ICD-10-CM | POA: Diagnosis not present

## 2017-04-07 DIAGNOSIS — M353 Polymyalgia rheumatica: Secondary | ICD-10-CM | POA: Diagnosis not present

## 2017-04-24 DIAGNOSIS — M353 Polymyalgia rheumatica: Secondary | ICD-10-CM | POA: Diagnosis not present

## 2017-04-24 DIAGNOSIS — Z6834 Body mass index (BMI) 34.0-34.9, adult: Secondary | ICD-10-CM | POA: Diagnosis not present

## 2017-04-24 DIAGNOSIS — E669 Obesity, unspecified: Secondary | ICD-10-CM | POA: Diagnosis not present

## 2017-04-24 DIAGNOSIS — Z7952 Long term (current) use of systemic steroids: Secondary | ICD-10-CM | POA: Diagnosis not present

## 2017-04-25 DIAGNOSIS — H52203 Unspecified astigmatism, bilateral: Secondary | ICD-10-CM | POA: Diagnosis not present

## 2017-04-25 DIAGNOSIS — H2513 Age-related nuclear cataract, bilateral: Secondary | ICD-10-CM | POA: Diagnosis not present

## 2017-04-25 DIAGNOSIS — H353131 Nonexudative age-related macular degeneration, bilateral, early dry stage: Secondary | ICD-10-CM | POA: Diagnosis not present

## 2017-04-30 ENCOUNTER — Other Ambulatory Visit: Payer: Self-pay | Admitting: Cardiology

## 2017-04-30 NOTE — Telephone Encounter (Signed)
Recent lab work from [employer] [facility]

## 2017-06-10 ENCOUNTER — Ambulatory Visit (INDEPENDENT_AMBULATORY_CARE_PROVIDER_SITE_OTHER): Payer: PPO | Admitting: Pulmonary Disease

## 2017-06-10 ENCOUNTER — Encounter: Payer: Self-pay | Admitting: Pulmonary Disease

## 2017-06-10 VITALS — BP 116/72 | HR 63 | Ht 64.0 in | Wt 197.0 lb

## 2017-06-10 DIAGNOSIS — G4733 Obstructive sleep apnea (adult) (pediatric): Secondary | ICD-10-CM | POA: Insufficient documentation

## 2017-06-10 NOTE — Progress Notes (Signed)
Subjective:    Patient ID: [name] [name], female    DOB: 1946-09-10, 71 y.o.   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 She has chronic paroxysmal atrial fibrillation since 2001 and is maintained on sotalol 80 mg twice daily and Xarelto. Polymyalgia rheumatica was diagnosed in 2016, she had dramatic improvement with prednisone and this has been slowly titrated down to her current dose of 4 mg daily. She has mild persistent asthma for which she sees my partner Dr. Lake Bells and denies nocturnal symptoms or wheezing. She is currently off inhaled steroids.  She reports excessive daytime fatigue for several months. Loud snoring has been noted by her husband and her granddaughter. She reports non-refreshing sleep. Epworth sleepiness score is 5/24. Bedtime is between 9:30 and 10 PM, sleep latency is 15-30 minutes, lately she has been using a sleep aid that contains 1.5 mg of melatonin and this seems to help and sleep maintenance he did she reports one to 2 nocturnal awakenings including one bathroom visits, sleeps on her side with one pillow and is out of bed by 8 AM feeling tired with dryness of mouth and occasional headaches. On work days she has to wake up around 5 AM and she does this with great difficulty and her husband has to turn the TV on loud to wake her up. She's gained about 25 pounds in the last year since being on prednisone. She occasionally naps for about 1-2 hours but naps are not refreshing. She has used a mouth guard due to TM joint issues prepared for dentist.  There is no history suggestive of cataplexy, sleep paralysis or parasomnias     Past Medical History:  Diagnosis Date  . Asthma 1976  . Atrial fibrillation ([place])   . Bronchitis   . FUO (fever of unknown origin) 03/03/2015  . Hepatitis 1980s  . Hepatitis   . HTN (hypertension)   . Hypercholesterolemia   . Night sweat 03/03/2015  .  PMR (polymyalgia rheumatica) ([place]) 03/15/2015  . 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 Polymyalgia ([place]) 03/03/2015  . Polymyositis ([place]) 03/03/2015     Past Surgical History:  Procedure Laterality Date  . APPENDECTOMY  1973  . FOOT SURGERY Left 2009  . KNEE ARTHROPLASTY  2007   right  . OVARY SURGERY  2001  . TUBAL LIGATION  1972  . VAGINAL HYSTERECTOMY  1973     Allergies  Allergen Reactions  . Lipitor [Atorvastatin] Other (See Comments)    Muscle cramps severe   . Simvastatin Other (See Comments)    Severe muscle cramps   . Cephalosporins   . Nickel   . Sulfa Antibiotics     hives     Social History   Social History  . Marital status: Married    Spouse name: N/A  . Number of children: 3  . Years of education: N/A   Occupational History  . RN Retired   Social History Main Topics  . Smoking status: Former Smoker    Packs/day: 0.20    Years: 5.00    Types: Cigarettes    Quit date: 09/23/1980  . Smokeless tobacco: Never Used  . Alcohol use Yes     Comment: 1 glass 5-6 times weekly  . Drug use: No  . Sexual activity: No   Other Topics Concern  . Not on file   Social History Narrative  . No narrative on file     Family  History  Problem Relation Age of Onset  . Hypertension Sister   . Multiple sclerosis Daughter   . Lung cancer Father   . Hypertension Mother   . Breast cancer Unknown        maternal aunt  . Allergies Daughter   . Allergies Daughter   . Allergies Son      Review of Systems Positive for acid heartburn, occasional irregular heartbeat, weight gain of 20 pounds, joint stiffness.    Constitutional: negative for anorexia, fevers and sweats  Eyes: negative for irritation, redness and visual disturbance  Ears, nose, mouth, throat, and face: negative for earaches, epistaxis, nasal congestion and sore throat  Respiratory: negative for cough, dyspnea on exertion, sputum and wheezing  Cardiovascular: negative for chest pain, dyspnea, lower  extremity edema, orthopnea, palpitations and syncope  Gastrointestinal: negative for abdominal pain, constipation, diarrhea, melena, nausea and vomiting  Genitourinary:negative for dysuria, frequency and hematuria  Hematologic/lymphatic: negative for bleeding, easy bruising and lymphadenopathy  Musculoskeletal:negative for arthralgias, muscle weakness and stiff joints  Neurological: negative for coordination problems, gait problems, headaches and weakness  Endocrine: negative for diabetic symptoms including polydipsia, polyuria and weight loss     Objective:   Physical Exam  Gen. Pleasant, well-nourished, in no distress ENT - class 2 airway, no post nasal drip Neck: No JVD, no thyromegaly, no carotid bruits Lungs: no use of accessory muscles, no dullness to percussion, clear without rales or rhonchi  Cardiovascular: Rhythm regular, heart sounds  normal, no murmurs or gallops, no peripheral edema Musculoskeletal: No deformities, no cyanosis or clubbing        Assessment & Plan:

## 2017-06-10 NOTE — Assessment & Plan Note (Signed)
Given excessive daytime somnolence, narrow pharyngeal exam, witnessed apneas & loud snoring, obstructive sleep apnea is very likely & an overnight polysomnogram will be scheduled as a home study. The pathophysiology of obstructive sleep apnea , it's cardiovascular consequences & modes of treatment including CPAP were discused with the patient in detail & they evidenced understanding.  Pretest probability is intermediate  

## 2017-06-10 NOTE — Patient Instructions (Signed)
Home sleep study 

## 2017-06-11 DIAGNOSIS — Z1231 Encounter for screening mammogram for malignant neoplasm of breast: Secondary | ICD-10-CM | POA: Diagnosis not present

## 2017-06-16 DIAGNOSIS — G4733 Obstructive sleep apnea (adult) (pediatric): Secondary | ICD-10-CM | POA: Diagnosis not present

## 2017-06-19 ENCOUNTER — Telehealth: Payer: Self-pay | Admitting: Pulmonary Disease

## 2017-06-19 ENCOUNTER — Other Ambulatory Visit: Payer: Self-pay | Admitting: *Deleted

## 2017-06-19 DIAGNOSIS — G4733 Obstructive sleep apnea (adult) (pediatric): Secondary | ICD-10-CM

## 2017-06-19 NOTE — Telephone Encounter (Signed)
Per RA, HST showed severe OSA with 32 events per hour. Suggests a CPAP titration as the next step.

## 2017-06-28 ENCOUNTER — Encounter: Payer: Self-pay | Admitting: Pulmonary Disease

## 2017-06-28 DIAGNOSIS — G4733 Obstructive sleep apnea (adult) (pediatric): Secondary | ICD-10-CM

## 2017-06-30 ENCOUNTER — Other Ambulatory Visit: Payer: Self-pay

## 2017-06-30 DIAGNOSIS — G4733 Obstructive sleep apnea (adult) (pediatric): Secondary | ICD-10-CM

## 2017-07-15 NOTE — Telephone Encounter (Signed)
Since titration study is delayed  Pl send rx for autoCPAP 5-12 cm, nasal pillows, humidity DL in 4 wks OV in 6wks

## 2017-07-17 NOTE — Telephone Encounter (Signed)
Pl see phone note !!!

## 2017-07-19 DIAGNOSIS — L509 Urticaria, unspecified: Secondary | ICD-10-CM | POA: Diagnosis not present

## 2017-07-23 ENCOUNTER — Encounter: Payer: Self-pay | Admitting: Pulmonary Disease

## 2017-07-23 DIAGNOSIS — L508 Other urticaria: Secondary | ICD-10-CM | POA: Diagnosis not present

## 2017-07-23 DIAGNOSIS — R5383 Other fatigue: Secondary | ICD-10-CM | POA: Diagnosis not present

## 2017-07-23 DIAGNOSIS — I10 Essential (primary) hypertension: Secondary | ICD-10-CM | POA: Diagnosis not present

## 2017-07-23 DIAGNOSIS — M353 Polymyalgia rheumatica: Secondary | ICD-10-CM | POA: Diagnosis not present

## 2017-07-23 DIAGNOSIS — Z6834 Body mass index (BMI) 34.0-34.9, adult: Secondary | ICD-10-CM | POA: Diagnosis not present

## 2017-07-23 DIAGNOSIS — L509 Urticaria, unspecified: Secondary | ICD-10-CM | POA: Diagnosis not present

## 2017-07-23 DIAGNOSIS — G4733 Obstructive sleep apnea (adult) (pediatric): Secondary | ICD-10-CM

## 2017-07-23 DIAGNOSIS — J3089 Other allergic rhinitis: Secondary | ICD-10-CM | POA: Diagnosis not present

## 2017-07-23 DIAGNOSIS — E119 Type 2 diabetes mellitus without complications: Secondary | ICD-10-CM | POA: Diagnosis not present

## 2017-07-23 DIAGNOSIS — M779 Enthesopathy, unspecified: Secondary | ICD-10-CM | POA: Diagnosis not present

## 2017-07-23 DIAGNOSIS — I480 Paroxysmal atrial fibrillation: Secondary | ICD-10-CM | POA: Diagnosis not present

## 2017-07-23 NOTE — Telephone Encounter (Signed)
Per pt's chart, 10.26.18 order for new CPAP start was not placed in the required template This has been corrected and e-mail sent to patient

## 2017-07-24 ENCOUNTER — Encounter: Payer: Self-pay | Admitting: Pulmonary Disease

## 2017-07-24 DIAGNOSIS — Z7952 Long term (current) use of systemic steroids: Secondary | ICD-10-CM | POA: Diagnosis not present

## 2017-07-24 DIAGNOSIS — E669 Obesity, unspecified: Secondary | ICD-10-CM | POA: Diagnosis not present

## 2017-07-24 DIAGNOSIS — M353 Polymyalgia rheumatica: Secondary | ICD-10-CM | POA: Diagnosis not present

## 2017-07-24 DIAGNOSIS — R21 Rash and other nonspecific skin eruption: Secondary | ICD-10-CM | POA: Diagnosis not present

## 2017-07-24 DIAGNOSIS — Z6834 Body mass index (BMI) 34.0-34.9, adult: Secondary | ICD-10-CM | POA: Diagnosis not present

## 2017-07-30 DIAGNOSIS — G4733 Obstructive sleep apnea (adult) (pediatric): Secondary | ICD-10-CM | POA: Diagnosis not present

## 2017-08-07 ENCOUNTER — Encounter (HOSPITAL_BASED_OUTPATIENT_CLINIC_OR_DEPARTMENT_OTHER): Payer: Self-pay

## 2017-08-12 DIAGNOSIS — L509 Urticaria, unspecified: Secondary | ICD-10-CM | POA: Diagnosis not present

## 2017-08-12 DIAGNOSIS — L309 Dermatitis, unspecified: Secondary | ICD-10-CM | POA: Diagnosis not present

## 2017-08-12 DIAGNOSIS — G4733 Obstructive sleep apnea (adult) (pediatric): Secondary | ICD-10-CM | POA: Diagnosis not present

## 2017-08-18 ENCOUNTER — Other Ambulatory Visit: Payer: Self-pay | Admitting: Pharmacist Clinician (PhC)/ Clinical Pharmacy Specialist

## 2017-08-18 MED ORDER — RIVAROXABAN 20 MG PO TABS: 20.0000 mg | ORAL_TABLET | Freq: Every day | ORAL | 1 refills | Status: DC

## 2017-08-20 ENCOUNTER — Telehealth: Payer: Self-pay | Admitting: *Deleted

## 2017-08-20 DIAGNOSIS — L309 Dermatitis, unspecified: Secondary | ICD-10-CM | POA: Diagnosis not present

## 2017-08-20 DIAGNOSIS — L509 Urticaria, unspecified: Secondary | ICD-10-CM | POA: Diagnosis not present

## 2017-08-20 DIAGNOSIS — D492 Neoplasm of unspecified behavior of bone, soft tissue, and skin: Secondary | ICD-10-CM | POA: Diagnosis not present

## 2017-08-20 NOTE — Telephone Encounter (Signed)
The patient came in as a walk in. She stated that she started Xarelto 20 mg in February. Since then she had been breaking out in a rash and itching on her abdomen, head and back.   Her dermatologist had increased her prednisone for a short period of time which did alleviate the itching. It has since returned.  In speaking with the pharmacist, she has recommended stopping Xarelto for two weeks and trying Eliquis 5 mg bid. She will start the Eliquis tomorrow and call us back in two weeks with an update. At that time, if the itching and rash has resolved, the office will then give her a prescription for the Eliquis. The patient verbalized her understanding. Will route to the provider for his knowledge.

## 2017-08-21 NOTE — Telephone Encounter (Signed)
Agree  Rosamaria Donn MD, FACC   

## 2017-08-26 ENCOUNTER — Telehealth: Payer: Self-pay | Admitting: Cardiology

## 2017-08-26 NOTE — Telephone Encounter (Signed)
Pt c/o medication issue:  1. Name of Medication: xerelto    2. How are you currently taking this medication (dosage and times per day)? 25mg  1x day 3. Are you having a reaction (difficulty breathing--STAT)?  no  4. What is your medication issue? itching and black and blue

## 2017-08-26 NOTE — Telephone Encounter (Signed)
Returned call to patient. She has been on xarelto since Feb. She spoke with RN/pharmacist 11/28. She was changed from xarelto to eliquis. She started eliquis on 11/28. She is itching worse now. She had biopsies done by dermatology and she said the results indicated that she is having a drug rxn. She reports her sed rate is up as well. She reports resolution of symptoms with high dose prednisone. She is itching very bad and is uncomfortable and is unable to sleep.   Advised will forward message to pharmacy staff and MD for review.

## 2017-08-27 MED ORDER — DABIGATRAN ETEXILATE MESYLATE 150 MG PO CAPS
150.0000 mg | ORAL_CAPSULE | Freq: Two times a day (BID) | ORAL | 6 refills | Status: DC
Start: 2017-08-27 — End: 2017-11-03

## 2017-08-27 NOTE — Telephone Encounter (Signed)
LMTCB   MyChart message sent

## 2017-08-27 NOTE — Telephone Encounter (Signed)
F/U Call:  Patient returning your call.

## 2017-08-27 NOTE — Telephone Encounter (Signed)
Patient can try Pradaxa if she would like.  It has a different mechanism, so theoretically she should not be allergic to it as well.   Dose would be 150 mg bid.  If she has a reaction with that she will have to go to warfarin

## 2017-08-27 NOTE — Telephone Encounter (Signed)
See below

## 2017-08-27 NOTE — Telephone Encounter (Signed)
Spoke with pt, aware of medication change. New script sent to the pharmacy and samples placed at the front desk for patient pick up.

## 2017-08-29 DIAGNOSIS — G4733 Obstructive sleep apnea (adult) (pediatric): Secondary | ICD-10-CM | POA: Diagnosis not present

## 2017-09-03 DIAGNOSIS — L509 Urticaria, unspecified: Secondary | ICD-10-CM | POA: Diagnosis not present

## 2017-09-03 DIAGNOSIS — D492 Neoplasm of unspecified behavior of bone, soft tissue, and skin: Secondary | ICD-10-CM | POA: Diagnosis not present

## 2017-09-10 ENCOUNTER — Encounter (HOSPITAL_BASED_OUTPATIENT_CLINIC_OR_DEPARTMENT_OTHER): Payer: Self-pay

## 2017-09-11 ENCOUNTER — Ambulatory Visit: Payer: PPO | Admitting: Pulmonary Disease

## 2017-09-11 ENCOUNTER — Encounter: Payer: Self-pay | Admitting: Pulmonary Disease

## 2017-09-11 DIAGNOSIS — G4733 Obstructive sleep apnea (adult) (pediatric): Secondary | ICD-10-CM

## 2017-09-11 DIAGNOSIS — J4540 Moderate persistent asthma, uncomplicated: Secondary | ICD-10-CM | POA: Diagnosis not present

## 2017-09-11 NOTE — Progress Notes (Signed)
   Subjective:    Patient ID: [name] [name], female    DOB: Aug 16, 1946, 71 y.o.   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 Polymyalgia rheumatica was diagnosed in 2016, she had dramatic improvement with prednisone and this has been slowly titrated down to her current dose of 4 mg daily. She has mild persistent asthma ,currently off inhaled steroids.  She was started on auto CPAP in October after home study showed severe OSA.  She tried a nasal mask and has adjusted to this well she feels good improvement in her energy and daytime somnolence has decreased.  Snoring is gone. She has adjusted well to the machine, no problems with mask or pressure. Download was reviewed which shows auto settings with average pressure of 12 cm and minimal leak and no residual events.  Compliance is excellent  She is down to 4 mg of prednisone for polymyalgia She [place] She had itching and reaction to anticoagulant medications  Significant tests/ events reviewed  HST 05/2017 AHI 32/h   Review of Systems Patient denies significant dyspnea,cough, hemoptysis,  chest pain, palpitations, pedal edema, orthopnea, paroxysmal nocturnal dyspnea, lightheadedness, nausea, vomiting, abdominal or  leg pains      Objective:   Physical Exam  Gen. Pleasant, obese, in no distress ENT - no lesions, no post nasal drip Neck: No JVD, no thyromegaly, no carotid bruits Lungs: no use of accessory muscles, no dullness to percussion, decreased without rales or rhonchi  Cardiovascular: Rhythm regular, heart sounds  normal, no murmurs or gallops, no peripheral edema Musculoskeletal: No deformities, no cyanosis or clubbing , no tremors       Assessment & Plan:

## 2017-09-11 NOTE — Patient Instructions (Signed)
Your CPAP is set at 12 cm

## 2017-09-11 NOTE — Assessment & Plan Note (Signed)
I doubt that she really has asthma.  Probably has reactive airway disease which is now resolved. Has not needed albuterol inhaler and is off inhaled steroids.

## 2017-09-11 NOTE — Assessment & Plan Note (Signed)
CPAP is set at 12 cm and seems to be working well. CPAP supplies will be renewed for a year  Weight loss encouraged, compliance with goal of at least 4-6 hrs every night is the expectation. Advised against medications with sedative side effects Cautioned against driving when sleepy - understanding that sleepiness will vary on a day to day basis

## 2017-09-17 DIAGNOSIS — L299 Pruritus, unspecified: Secondary | ICD-10-CM | POA: Diagnosis not present

## 2017-09-17 DIAGNOSIS — R21 Rash and other nonspecific skin eruption: Secondary | ICD-10-CM | POA: Diagnosis not present

## 2017-09-17 DIAGNOSIS — Z7952 Long term (current) use of systemic steroids: Secondary | ICD-10-CM | POA: Diagnosis not present

## 2017-09-25 DIAGNOSIS — R197 Diarrhea, unspecified: Secondary | ICD-10-CM | POA: Diagnosis not present

## 2017-09-26 DIAGNOSIS — R197 Diarrhea, unspecified: Secondary | ICD-10-CM | POA: Diagnosis not present

## 2017-09-29 DIAGNOSIS — G4733 Obstructive sleep apnea (adult) (pediatric): Secondary | ICD-10-CM | POA: Diagnosis not present

## 2017-10-01 DIAGNOSIS — T7840XA Allergy, unspecified, initial encounter: Secondary | ICD-10-CM | POA: Diagnosis not present

## 2017-10-01 DIAGNOSIS — L509 Urticaria, unspecified: Secondary | ICD-10-CM | POA: Diagnosis not present

## 2017-10-08 DIAGNOSIS — L309 Dermatitis, unspecified: Secondary | ICD-10-CM | POA: Diagnosis not present

## 2017-10-24 DIAGNOSIS — Z7952 Long term (current) use of systemic steroids: Secondary | ICD-10-CM | POA: Diagnosis not present

## 2017-10-24 DIAGNOSIS — M353 Polymyalgia rheumatica: Secondary | ICD-10-CM | POA: Diagnosis not present

## 2017-10-24 DIAGNOSIS — Z6835 Body mass index (BMI) 35.0-35.9, adult: Secondary | ICD-10-CM | POA: Diagnosis not present

## 2017-10-24 DIAGNOSIS — R21 Rash and other nonspecific skin eruption: Secondary | ICD-10-CM | POA: Diagnosis not present

## 2017-10-24 DIAGNOSIS — E669 Obesity, unspecified: Secondary | ICD-10-CM | POA: Diagnosis not present

## 2017-10-30 DIAGNOSIS — G4733 Obstructive sleep apnea (adult) (pediatric): Secondary | ICD-10-CM | POA: Diagnosis not present

## 2017-11-01 NOTE — Progress Notes (Signed)
[name] [name] Date of Birth: 04-07-1946   History of Present Illness: [name] [name] is seen for  followup. She has a history of atrial fibrillation that has been well controlled with sotalol since 2003. Prior use of Toprol caused her to be very fatigued and calcium channel blockers did not help.   Since her last visit she developed a bad pruritic rash all over. Skin biopsy was c/w a drug reaction. She was placed on steroids and Xarelto was stopped in favor of Eliquis. Rash did not clear on Eliquis and this was also stopped. Rash finally resolved. Patient given option of Pradaxa but she refused. Now only taking ASA 81 mg daily. She denies any Afib episodes. Steroids now reduced to 3.5 mg daily for PMR. She has Cardiomobile which she carries with her now. She has been diagnosed with OSA and is now on CPAP.   Current Outpatient Medications on File Prior to Visit  Medication Sig Dispense Refill  . chlorthalidone (HYGROTON) 25 MG tablet Take 1 tablet (25 mg total) by mouth daily. 90 tablet 1  . Cholecalciferol (VITAMIN D3) LIQD Take 1,000 Units by mouth daily.     Marland Kitchen estradiol (VIVELLE-DOT) 0.025 MG/24HR Place 1 patch onto the skin 2 (two) times a week.     . irbesartan (AVAPRO) 300 MG tablet Take 300 mg by mouth daily.    Marland Kitchen omega-3 acid ethyl esters (LOVAZA) 1 G capsule Take 2 g by mouth daily.    Marland Kitchen omeprazole (PRILOSEC) 20 MG capsule Take 20 mg by mouth daily.    . ondansetron (ZOFRAN-ODT) 4 MG disintegrating tablet Take 4 mg by mouth as needed.     . predniSONE (DELTASONE) 5 MG tablet Take 3.5 mg by mouth daily with breakfast.     . PROAIR HFA 108 (90 BASE) MCG/ACT inhaler Inhale 2 puffs into the lungs every 6 (six) hours as needed.     . sotalol (BETAPACE) 80 MG tablet Take 80 mg by mouth 2 (two) times daily.      Marland Kitchen venlafaxine (EFFEXOR-XR) 75 MG 24 hr capsule Take 75 mg by mouth Daily.      No current facility-administered medications on file prior to visit.     Allergies  Allergen Reactions   . Atorvastatin Other (See Comments)    Muscle cramps severe  Other reaction(s): Cramps (ALLERGY/intolerance), Other Muscle cramps severe Muscle cramps severe   . Simvastatin Other (See Comments)    Severe muscle cramps  Other reaction(s): Cramps (ALLERGY/intolerance) Severe muscle cramps Severe muscle cramps   . Cephalosporins     Other reaction(s): Other, Other (See Comments)  . Nickel Rash    Other reaction(s): Other  . Sulfa Antibiotics     hives Other reaction(s): Other hives  . Sulfasalazine Hives    hives  . Eliquis [Apixaban] Hives and Rash  . Xarelto [Rivaroxaban] Hives and Rash    Past Medical History:  Diagnosis Date  . Asthma 1976  . Atrial fibrillation ([place])   . Bronchitis   . FUO (fever of unknown origin) 03/03/2015  . Hepatitis 1980s  . Hepatitis   . HTN (hypertension)   . Hypercholesterolemia   . Night sweat 03/03/2015  . PMR (polymyalgia rheumatica) ([place]) 03/15/2015  . 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 Polymyalgia ([place]) 03/03/2015  . Polymyositis ([place]) 03/03/2015    Past Surgical History:  Procedure Laterality Date  . APPENDECTOMY  1973  . FOOT SURGERY Left 2009  . KNEE ARTHROPLASTY  2007   right  . OVARY SURGERY  2001  . [place]  . VAGINAL HYSTERECTOMY  1973    Social History   Tobacco Use  Smoking Status Former Smoker  . Packs/day: 0.20  . Years: 5.00  . Pack years: 1.00  . Types: Cigarettes  . Last attempt to quit: 09/23/1980  . Years since quitting: 37.1  Smokeless Tobacco Never Used    Social History   Substance and Sexual Activity  Alcohol Use Yes   Comment: 1 glass 5-6 times weekly    Family History  Problem Relation Age of Onset  . Hypertension Sister   . Lung cancer Father   . Hypertension Mother   . Multiple sclerosis Daughter   . Breast cancer Unknown        maternal aunt  . Allergies Daughter   . Allergies Daughter   . Allergies Son     Review of Systems: As noted in history of present illness.  All  other systems were reviewed and are negative.  Physical Exam: BP 132/72   Pulse 66   Ht 5\' 3"  (1.6 m)   Wt 201 lb 12.8 oz (91.5 kg)   BMI 35.75 kg/m  GENERAL:  Well appearing WF in NAD HEENT:  PERRL, EOMI, sclera are clear. Oropharynx is clear. NECK:  No jugular venous distention, carotid upstroke brisk and symmetric, no bruits, no thyromegaly or adenopathy LUNGS:  Clear to auscultation bilaterally CHEST:  Unremarkable HEART:  RRR,  PMI not displaced or sustained,S1 and S2 within normal limits, no S3, no S4: no clicks, no rubs, no murmurs ABD:  Soft, nontender. BS +, no masses or bruits. No hepatomegaly, no splenomegaly EXT:  2 + pulses throughout, no edema, no cyanosis no clubbing SKIN:  Warm and dry.  No rashes NEURO:  Alert and oriented x 3. Cranial nerves II through XII intact. PSYCH:  Cognitively intact    LBORATORY DATA:  Lab Results  Component Value Date   WBC 9.5 02/27/2015   HGB 10.5 (L) 02/27/2015   HCT 33.0 (L) 02/27/2015   PLT 354 02/27/2015   GLUCOSE 127 (H) 02/27/2015   ALT 43 02/27/2015   AST 25 02/27/2015   NA 133 (L) 02/27/2015   K 4.3 02/27/2015   CL 99 (L) 02/27/2015   CREATININE 0.82 02/27/2015   BUN 14 02/27/2015   CO2 24 02/27/2015   Labs dated 02/06/16: cholesterol 236, triglycerides 132, HDL 69, LDL 141. CMET and TSH normal.  November 27/2017: A1c 5.6%. Dated 02/12/17: cholesterol 251, triglycerides 226, HDL 59, LDL 147.  Dated 07/23/17: A1c 5.5% Dated 09/25/17: normal chemistries and TSH.  Ecg today: NSR rate 66 bpm. Qtc 473 msec.Normal Ecg.  I have personally reviewed and interpreted this study.   Assessment / Plan: 1. Atrial fibrillation, well controlled on sotalol. She has a [place] Vasc score of 3. Intolerant of Xarelto and possibly Eliquis due to drug rash. Will continue to monitor with Cardiomobile. Stay on ASA 81 mg daily.   2. Hypertension. Blood pressure is well controlled.   3. PMR on steroids.   4. OSA on CPAP

## 2017-11-03 ENCOUNTER — Ambulatory Visit: Payer: PPO | Admitting: Cardiology

## 2017-11-03 ENCOUNTER — Encounter: Payer: Self-pay | Admitting: Cardiology

## 2017-11-03 VITALS — BP 132/72 | HR 66 | Ht 63.0 in | Wt 201.8 lb

## 2017-11-03 DIAGNOSIS — E7800 Pure hypercholesterolemia, unspecified: Secondary | ICD-10-CM

## 2017-11-03 DIAGNOSIS — G4733 Obstructive sleep apnea (adult) (pediatric): Secondary | ICD-10-CM | POA: Diagnosis not present

## 2017-11-03 DIAGNOSIS — I480 Paroxysmal atrial fibrillation: Secondary | ICD-10-CM | POA: Diagnosis not present

## 2017-11-03 NOTE — Patient Instructions (Addendum)
Medication Instructions:  Your physician recommends that you continue on your current medications as directed. Please refer to the Current Medication list given to you today.   Labwork: none  Testing/Procedures: none  Follow-Up: Your physician wants you to follow-up in: 12 months with Dr. [place]. You will receive a reminder letter in the mail two months in advance. If you don't receive a letter, please call our office to schedule the follow-up appointment.   Any Other Special Instructions Will Be Listed Below (If Applicable).     If you need a refill on your cardiac medications before your next appointment, please call your pharmacy.

## 2017-11-12 DIAGNOSIS — G4733 Obstructive sleep apnea (adult) (pediatric): Secondary | ICD-10-CM | POA: Diagnosis not present

## 2017-11-27 DIAGNOSIS — G4733 Obstructive sleep apnea (adult) (pediatric): Secondary | ICD-10-CM | POA: Diagnosis not present

## 2017-12-17 DIAGNOSIS — E119 Type 2 diabetes mellitus without complications: Secondary | ICD-10-CM | POA: Diagnosis not present

## 2017-12-17 DIAGNOSIS — I10 Essential (primary) hypertension: Secondary | ICD-10-CM | POA: Diagnosis not present

## 2017-12-17 DIAGNOSIS — I480 Paroxysmal atrial fibrillation: Secondary | ICD-10-CM | POA: Diagnosis not present

## 2017-12-17 DIAGNOSIS — G4733 Obstructive sleep apnea (adult) (pediatric): Secondary | ICD-10-CM | POA: Diagnosis not present

## 2017-12-17 DIAGNOSIS — Z6834 Body mass index (BMI) 34.0-34.9, adult: Secondary | ICD-10-CM | POA: Diagnosis not present

## 2017-12-17 DIAGNOSIS — M353 Polymyalgia rheumatica: Secondary | ICD-10-CM | POA: Diagnosis not present

## 2017-12-17 DIAGNOSIS — L509 Urticaria, unspecified: Secondary | ICD-10-CM | POA: Diagnosis not present

## 2017-12-17 DIAGNOSIS — J45909 Unspecified asthma, uncomplicated: Secondary | ICD-10-CM | POA: Diagnosis not present

## 2017-12-22 DIAGNOSIS — E669 Obesity, unspecified: Secondary | ICD-10-CM | POA: Diagnosis not present

## 2017-12-22 DIAGNOSIS — M353 Polymyalgia rheumatica: Secondary | ICD-10-CM | POA: Diagnosis not present

## 2017-12-22 DIAGNOSIS — Z7952 Long term (current) use of systemic steroids: Secondary | ICD-10-CM | POA: Diagnosis not present

## 2017-12-22 DIAGNOSIS — R21 Rash and other nonspecific skin eruption: Secondary | ICD-10-CM | POA: Diagnosis not present

## 2017-12-22 DIAGNOSIS — Z6834 Body mass index (BMI) 34.0-34.9, adult: Secondary | ICD-10-CM | POA: Diagnosis not present

## 2017-12-25 DIAGNOSIS — Z01419 Encounter for gynecological examination (general) (routine) without abnormal findings: Secondary | ICD-10-CM | POA: Diagnosis not present

## 2017-12-25 DIAGNOSIS — Z6834 Body mass index (BMI) 34.0-34.9, adult: Secondary | ICD-10-CM | POA: Diagnosis not present

## 2017-12-28 DIAGNOSIS — G4733 Obstructive sleep apnea (adult) (pediatric): Secondary | ICD-10-CM | POA: Diagnosis not present

## 2018-01-14 DIAGNOSIS — M353 Polymyalgia rheumatica: Secondary | ICD-10-CM | POA: Diagnosis not present

## 2018-01-14 DIAGNOSIS — Z6834 Body mass index (BMI) 34.0-34.9, adult: Secondary | ICD-10-CM | POA: Diagnosis not present

## 2018-01-14 DIAGNOSIS — R05 Cough: Secondary | ICD-10-CM | POA: Diagnosis not present

## 2018-01-14 DIAGNOSIS — R509 Fever, unspecified: Secondary | ICD-10-CM | POA: Diagnosis not present

## 2018-01-27 DIAGNOSIS — G4733 Obstructive sleep apnea (adult) (pediatric): Secondary | ICD-10-CM | POA: Diagnosis not present

## 2018-02-17 DIAGNOSIS — G4733 Obstructive sleep apnea (adult) (pediatric): Secondary | ICD-10-CM | POA: Diagnosis not present

## 2018-02-23 DIAGNOSIS — E669 Obesity, unspecified: Secondary | ICD-10-CM | POA: Diagnosis not present

## 2018-02-23 DIAGNOSIS — R21 Rash and other nonspecific skin eruption: Secondary | ICD-10-CM | POA: Diagnosis not present

## 2018-02-23 DIAGNOSIS — Z6834 Body mass index (BMI) 34.0-34.9, adult: Secondary | ICD-10-CM | POA: Diagnosis not present

## 2018-02-23 DIAGNOSIS — Z7952 Long term (current) use of systemic steroids: Secondary | ICD-10-CM | POA: Diagnosis not present

## 2018-02-23 DIAGNOSIS — M353 Polymyalgia rheumatica: Secondary | ICD-10-CM | POA: Diagnosis not present

## 2018-02-27 DIAGNOSIS — G4733 Obstructive sleep apnea (adult) (pediatric): Secondary | ICD-10-CM | POA: Diagnosis not present

## 2018-03-11 ENCOUNTER — Encounter: Payer: Self-pay | Admitting: Cardiology

## 2018-03-11 DIAGNOSIS — R82998 Other abnormal findings in urine: Secondary | ICD-10-CM | POA: Diagnosis not present

## 2018-03-11 DIAGNOSIS — M859 Disorder of bone density and structure, unspecified: Secondary | ICD-10-CM | POA: Diagnosis not present

## 2018-03-11 DIAGNOSIS — E119 Type 2 diabetes mellitus without complications: Secondary | ICD-10-CM | POA: Diagnosis not present

## 2018-03-11 DIAGNOSIS — I10 Essential (primary) hypertension: Secondary | ICD-10-CM | POA: Diagnosis not present

## 2018-03-11 DIAGNOSIS — E7849 Other hyperlipidemia: Secondary | ICD-10-CM | POA: Diagnosis not present

## 2018-03-24 DIAGNOSIS — I480 Paroxysmal atrial fibrillation: Secondary | ICD-10-CM | POA: Diagnosis not present

## 2018-03-24 DIAGNOSIS — E119 Type 2 diabetes mellitus without complications: Secondary | ICD-10-CM | POA: Diagnosis not present

## 2018-03-24 DIAGNOSIS — I10 Essential (primary) hypertension: Secondary | ICD-10-CM | POA: Diagnosis not present

## 2018-03-24 DIAGNOSIS — Z1389 Encounter for screening for other disorder: Secondary | ICD-10-CM | POA: Diagnosis not present

## 2018-03-24 DIAGNOSIS — K635 Polyp of colon: Secondary | ICD-10-CM | POA: Diagnosis not present

## 2018-03-24 DIAGNOSIS — E7849 Other hyperlipidemia: Secondary | ICD-10-CM | POA: Diagnosis not present

## 2018-03-24 DIAGNOSIS — L509 Urticaria, unspecified: Secondary | ICD-10-CM | POA: Diagnosis not present

## 2018-03-24 DIAGNOSIS — Z0000 Encounter for general adult medical examination without abnormal findings: Secondary | ICD-10-CM | POA: Diagnosis not present

## 2018-03-24 DIAGNOSIS — Z6834 Body mass index (BMI) 34.0-34.9, adult: Secondary | ICD-10-CM | POA: Diagnosis not present

## 2018-03-24 DIAGNOSIS — M859 Disorder of bone density and structure, unspecified: Secondary | ICD-10-CM | POA: Diagnosis not present

## 2018-03-24 DIAGNOSIS — M353 Polymyalgia rheumatica: Secondary | ICD-10-CM | POA: Diagnosis not present

## 2018-03-24 DIAGNOSIS — G4733 Obstructive sleep apnea (adult) (pediatric): Secondary | ICD-10-CM | POA: Diagnosis not present

## 2018-03-29 DIAGNOSIS — G4733 Obstructive sleep apnea (adult) (pediatric): Secondary | ICD-10-CM | POA: Diagnosis not present

## 2018-04-29 DIAGNOSIS — G4733 Obstructive sleep apnea (adult) (pediatric): Secondary | ICD-10-CM | POA: Diagnosis not present

## 2018-05-07 DIAGNOSIS — I10 Essential (primary) hypertension: Secondary | ICD-10-CM | POA: Diagnosis not present

## 2018-05-20 DIAGNOSIS — G4733 Obstructive sleep apnea (adult) (pediatric): Secondary | ICD-10-CM | POA: Diagnosis not present

## 2018-05-30 DIAGNOSIS — G4733 Obstructive sleep apnea (adult) (pediatric): Secondary | ICD-10-CM | POA: Diagnosis not present

## 2018-06-17 DIAGNOSIS — Z1231 Encounter for screening mammogram for malignant neoplasm of breast: Secondary | ICD-10-CM | POA: Diagnosis not present

## 2018-06-25 DIAGNOSIS — Z7952 Long term (current) use of systemic steroids: Secondary | ICD-10-CM | POA: Diagnosis not present

## 2018-06-25 DIAGNOSIS — E669 Obesity, unspecified: Secondary | ICD-10-CM | POA: Diagnosis not present

## 2018-06-25 DIAGNOSIS — M353 Polymyalgia rheumatica: Secondary | ICD-10-CM | POA: Diagnosis not present

## 2018-06-25 DIAGNOSIS — Z6834 Body mass index (BMI) 34.0-34.9, adult: Secondary | ICD-10-CM | POA: Diagnosis not present

## 2018-06-29 DIAGNOSIS — G4733 Obstructive sleep apnea (adult) (pediatric): Secondary | ICD-10-CM | POA: Diagnosis not present

## 2018-07-27 DIAGNOSIS — E876 Hypokalemia: Secondary | ICD-10-CM | POA: Diagnosis not present

## 2018-07-30 DIAGNOSIS — G4733 Obstructive sleep apnea (adult) (pediatric): Secondary | ICD-10-CM | POA: Diagnosis not present

## 2018-08-04 DIAGNOSIS — J45998 Other asthma: Secondary | ICD-10-CM | POA: Diagnosis not present

## 2018-08-04 DIAGNOSIS — G4733 Obstructive sleep apnea (adult) (pediatric): Secondary | ICD-10-CM | POA: Diagnosis not present

## 2018-08-04 DIAGNOSIS — Z6834 Body mass index (BMI) 34.0-34.9, adult: Secondary | ICD-10-CM | POA: Diagnosis not present

## 2018-08-04 DIAGNOSIS — E119 Type 2 diabetes mellitus without complications: Secondary | ICD-10-CM | POA: Diagnosis not present

## 2018-08-04 DIAGNOSIS — E7849 Other hyperlipidemia: Secondary | ICD-10-CM | POA: Diagnosis not present

## 2018-08-04 DIAGNOSIS — I480 Paroxysmal atrial fibrillation: Secondary | ICD-10-CM | POA: Diagnosis not present

## 2018-08-04 DIAGNOSIS — E876 Hypokalemia: Secondary | ICD-10-CM | POA: Diagnosis not present

## 2018-08-04 DIAGNOSIS — M353 Polymyalgia rheumatica: Secondary | ICD-10-CM | POA: Diagnosis not present

## 2018-08-04 DIAGNOSIS — I10 Essential (primary) hypertension: Secondary | ICD-10-CM | POA: Diagnosis not present

## 2018-08-17 ENCOUNTER — Encounter: Payer: Self-pay | Admitting: Pulmonary Disease

## 2018-08-17 ENCOUNTER — Ambulatory Visit: Payer: Self-pay | Admitting: Pulmonary Disease

## 2018-08-17 ENCOUNTER — Ambulatory Visit: Payer: PPO | Admitting: Pulmonary Disease

## 2018-08-17 VITALS — BP 144/58 | HR 74 | Wt 200.0 lb

## 2018-08-17 DIAGNOSIS — J4540 Moderate persistent asthma, uncomplicated: Secondary | ICD-10-CM

## 2018-08-17 DIAGNOSIS — G4733 Obstructive sleep apnea (adult) (pediatric): Secondary | ICD-10-CM

## 2018-08-17 NOTE — Progress Notes (Signed)
Subjective:    Patient ID: EDUARDO WURTH, female    DOB: April 05, 1946, 72 y.o.   MRN: [id number]  Synopsis: Former patient of Dr. Joya Gaskins who has asthma. He noted in his workup that she had normal spirometry and he described her condition as reactive airways disease.  She used to smoke "socially" 1 pack per week when out with friends.  She quit altogether in 1982 after smoking 6-7.   HPI Chief Complaint  Patient presents with  . Follow-up   Kobe is here to see me for the first time in about a year.  She says that she decided to stop using her CPAP and then she got tired so she started using it again.  She now feels better.  She says that she has been feeling more short of breath when she exerts herself such as climbing hills.  She says that she decided to stop exercising about a year ago.  She has not had bronchitis or pneumonia.  She has not had any chest tightness, wheezing or shortness of breath.  She has not used albuterol in over a year.    Past Medical History:  Diagnosis Date  . Asthma 1976  . Atrial fibrillation ([place])   . Bronchitis   . FUO (fever of unknown origin) 03/03/2015  . Hepatitis 1980s  . Hepatitis   . HTN (hypertension)   . Hypercholesterolemia   . Night sweat 03/03/2015  . PMR (polymyalgia rheumatica) ([place]) 03/15/2015  . 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 Polymyalgia ([place]) 03/03/2015  . Polymyositis ([place]) 03/03/2015      Review of Systems  Constitutional: Positive for fatigue. Negative for appetite change, chills and fever.  HENT: Negative for postnasal drip, rhinorrhea, sinus pressure and sinus pain.   Respiratory: Positive for shortness of breath. Negative for cough and wheezing.   Cardiovascular: Negative for chest pain, palpitations and leg swelling.  Psychiatric/Behavioral: Confusion: .dbmfu.       Objective:   Physical Exam  Vitals:   08/17/18 0950  BP: (!) 144/58  Pulse: 74  SpO2: 97%  Weight: 200 lb (90.7 kg)    Gen: well appearing HENT: OP  clear, TM's clear, neck supple PULM: CTA B, normal percussion CV: RRR, no mgr, trace edema GI: BS+, soft, nontender Derm: no cyanosis or rash Psyche: normal mood and affect        Assessment & Plan:   OSA (obstructive sleep apnea)  Moderate persistent asthma without complication  Discussion: This has been a stable interval for [place] in terms of asthma.  I agree with Dr. Elsworth Soho that she may not actually have asthma as she has not had to use albuterol in several years.  That being said, she does have obstructive sleep apnea which directly impacts her atrial fibrillation so I have encouraged her to stay compliant with CPAP.  She has increased dyspnea recently in the setting of weight gain and deconditioning.  I think the best approach is to start exercising.  If that does not help things then we can look into it more with lung function testing and chest x-rays.  Her physical exam and vital signs today are reassuring that there is not an underlying lung problem.  Plan: Shortness of breath: I think this is due to being out of shape Go back to the gym, start slow, stay consistent, escalate intensity at a slow rate If you are still feeling short of breath after 4 months of regular exercise come back to see me so we can  do a lung function test and chest x-ray  Obstructive sleep apnea: Keep using CPAP at 12 cm of water nightly Let us know if you need a refill for accessories for the machine  We will see you back in 1 year or sooner if needed (come back in 4 months if still short of breath)    Current Outpatient Medications:  .  chlorthalidone (HYGROTON) 25 MG tablet, Take 1 tablet (25 mg total) by mouth daily., Disp: 90 tablet, Rfl: 1 .  Cholecalciferol (VITAMIN D3) LIQD, Take 1,000 Units by mouth daily. , Disp: , Rfl:  .  estradiol (VIVELLE-DOT) 0.025 MG/24HR, Place 1 patch onto the skin 2 (two) times a week. , Disp: , Rfl:  .  irbesartan (AVAPRO) 300 MG tablet, Take 300 mg by mouth  daily., Disp: , Rfl:  .  omega-3 acid ethyl esters (LOVAZA) 1 G capsule, Take 2 g by mouth daily., Disp: , Rfl:  .  omeprazole (PRILOSEC) 20 MG capsule, Take 20 mg by mouth daily., Disp: , Rfl:  .  ondansetron (ZOFRAN-ODT) 4 MG disintegrating tablet, Take 4 mg by mouth as needed. , Disp: , Rfl:  .  predniSONE (DELTASONE) 5 MG tablet, Take 3.5 mg by mouth daily with breakfast. , Disp: , Rfl:  .  PROAIR HFA 108 (90 BASE) MCG/ACT inhaler, Inhale 2 puffs into the lungs every 6 (six) hours as needed. , Disp: , Rfl:  .  sotalol (BETAPACE) 80 MG tablet, Take 80 mg by mouth 2 (two) times daily.  , Disp: , Rfl:  .  venlafaxine (EFFEXOR-XR) 75 MG 24 hr capsule, Take 75 mg by mouth Daily. , Disp: , Rfl:

## 2018-08-17 NOTE — Patient Instructions (Signed)
Shortness of breath: I think this is due to being out of shape Go back to the gym, start slow, stay consistent, escalate intensity at a slow rate If you are still feeling short of breath after 4 months of regular exercise come back to see me so we can do a lung function test and chest x-ray  Obstructive sleep apnea: Keep using CPAP at 12 cm of water nightly Let us know if you need a refill for accessories for the machine  We will see you back in 1 year or sooner if needed (come back in 4 months if still short of breath)

## 2018-08-23 DIAGNOSIS — G4733 Obstructive sleep apnea (adult) (pediatric): Secondary | ICD-10-CM | POA: Diagnosis not present

## 2018-09-14 ENCOUNTER — Ambulatory Visit: Payer: PPO | Admitting: Pulmonary Disease

## 2018-09-14 ENCOUNTER — Encounter: Payer: Self-pay | Admitting: Pulmonary Disease

## 2018-09-14 DIAGNOSIS — J4540 Moderate persistent asthma, uncomplicated: Secondary | ICD-10-CM

## 2018-09-14 DIAGNOSIS — G4733 Obstructive sleep apnea (adult) (pediatric): Secondary | ICD-10-CM

## 2018-09-14 MED ORDER — ALBUTEROL SULFATE HFA 108 (90 BASE) MCG/ACT IN AERS: 2.0000 | INHALATION_SPRAY | Freq: Four times a day (QID) | RESPIRATORY_TRACT | 5 refills | Status: AC | PRN

## 2018-09-14 NOTE — Progress Notes (Signed)
   Subjective:    Patient ID: [name] [name], female    DOB: 1946/04/14, 72 y.o.   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 Polymyalgia rheumatica was diagnosed in 2016, good response to steroids. She has mild persistent asthma ,currently off inhaled steroids.   Chief Complaint  Patient presents with  . Follow-up    F/U for OSA. States she tried to do without machine but she felt horrible. She is now using the machine every night. Uses [employer] as her DME.     She tried to do without her machine few months ago but felt horrible.  She is back on her CPAP now with full facemask and feels much improved and better rested. No problems with pressure. CPAP download was reviewed which shows excellent control of events on average of 12 cm with minimal leak and great compliance.  She remains on sotalol and is on 5 mg of prednisone all the holidays was on as low as 2 and plans to taper again in January.  Asthma symptoms are well controlled but she is planning a trip to [place] next year and requests a refill on her albuterol  Significant tests/ events reviewed  HST 05/2017 AHI 32/h  Review of Systems neg for any significant sore throat, dysphagia, itching, sneezing, nasal congestion or excess/ purulent secretions, fever, chills, sweats, unintended wt loss, pleuritic or exertional cp, hempoptysis, orthopnea pnd or change in chronic leg swelling. Also denies presyncope, palpitations, heartburn, abdominal pain, nausea, vomiting, diarrhea or change in bowel or urinary habits, dysuria,hematuria, rash, arthralgias, visual complaints, headache, numbness weakness or ataxia.     Objective:   Physical Exam   Gen. Pleasant, obese, in no distress ENT - no lesions, no post nasal drip Neck: No JVD, no thyromegaly, no carotid bruits Lungs: no use of  accessory muscles, no dullness to percussion, decreased without rales or rhonchi  Cardiovascular: Rhythm regular, heart sounds  normal, no murmurs or gallops, no peripheral edema Musculoskeletal: No deformities, no cyanosis or clubbing , no tremors        Assessment & Plan:

## 2018-09-14 NOTE — Assessment & Plan Note (Signed)
Refills on albuterol

## 2018-09-14 NOTE — Assessment & Plan Note (Signed)
CPAP is working well, continue auto CPAP settings 5 to 12 cm with average pressure is 12 cm. This is certainly helped improve her daytime somnolence and fatigue  Weight loss encouraged, compliance with goal of at least 4-6 hrs every night is the expectation. Advised against medications with sedative side effects Cautioned against driving when sleepy - understanding that sleepiness will vary on a day to day basis

## 2018-09-14 NOTE — Patient Instructions (Signed)
Refill on albuterol. CPAP supplies will be renewed for a year

## 2018-09-21 DIAGNOSIS — H2513 Age-related nuclear cataract, bilateral: Secondary | ICD-10-CM | POA: Diagnosis not present

## 2018-10-01 DIAGNOSIS — Z6834 Body mass index (BMI) 34.0-34.9, adult: Secondary | ICD-10-CM | POA: Diagnosis not present

## 2018-10-01 DIAGNOSIS — J019 Acute sinusitis, unspecified: Secondary | ICD-10-CM | POA: Diagnosis not present

## 2018-10-01 DIAGNOSIS — J029 Acute pharyngitis, unspecified: Secondary | ICD-10-CM | POA: Diagnosis not present

## 2018-10-15 DIAGNOSIS — H2511 Age-related nuclear cataract, right eye: Secondary | ICD-10-CM | POA: Diagnosis not present

## 2018-10-15 DIAGNOSIS — H25811 Combined forms of age-related cataract, right eye: Secondary | ICD-10-CM | POA: Diagnosis not present

## 2018-10-21 DIAGNOSIS — J309 Allergic rhinitis, unspecified: Secondary | ICD-10-CM | POA: Diagnosis not present

## 2018-10-21 DIAGNOSIS — J45909 Unspecified asthma, uncomplicated: Secondary | ICD-10-CM | POA: Diagnosis not present

## 2018-10-21 DIAGNOSIS — K219 Gastro-esophageal reflux disease without esophagitis: Secondary | ICD-10-CM | POA: Diagnosis not present

## 2018-10-21 DIAGNOSIS — R05 Cough: Secondary | ICD-10-CM | POA: Diagnosis not present

## 2018-10-21 DIAGNOSIS — Z6834 Body mass index (BMI) 34.0-34.9, adult: Secondary | ICD-10-CM | POA: Diagnosis not present

## 2018-11-02 DIAGNOSIS — E669 Obesity, unspecified: Secondary | ICD-10-CM | POA: Diagnosis not present

## 2018-11-02 DIAGNOSIS — Z7952 Long term (current) use of systemic steroids: Secondary | ICD-10-CM | POA: Diagnosis not present

## 2018-11-02 DIAGNOSIS — Z6834 Body mass index (BMI) 34.0-34.9, adult: Secondary | ICD-10-CM | POA: Diagnosis not present

## 2018-11-02 DIAGNOSIS — M353 Polymyalgia rheumatica: Secondary | ICD-10-CM | POA: Diagnosis not present

## 2018-11-02 NOTE — Progress Notes (Deleted)
[name] [name] Date of Birth: December 31, 1945   History of Present Illness: [name] [name] is seen for  followup. She has a history of atrial fibrillation that has been well controlled with sotalol since 2003. Prior use of Toprol caused her to be very fatigued and calcium channel blockers did not help.   When seen last year she had a bad pruritic rash all over. Skin biopsy was c/w a drug reaction. She was placed on steroids and Xarelto was stopped in favor of Eliquis. Rash did not clear on Eliquis and this was also stopped. Rash finally resolved. Patient given option of Pradaxa but she refused. Now only taking ASA 81 mg daily. She denies any Afib episodes. She does take steroids for PMR. She has Cardiomobile which she carries with her now. She has been diagnosed with OSA and is now on CPAP.   Current Outpatient Medications on File Prior to Visit  Medication Sig Dispense Refill  . albuterol (PROAIR HFA) 108 (90 Base) MCG/ACT inhaler Inhale 2 puffs into the lungs every 6 (six) hours as needed. 1 Inhaler 5  . aspirin EC 81 MG tablet Take 81 mg by mouth daily.    . chlorthalidone (HYGROTON) 25 MG tablet Take 1 tablet (25 mg total) by mouth daily. 90 tablet 1  . Cholecalciferol (VITAMIN D3) LIQD Take 1,000 Units by mouth daily.     Marland Kitchen estradiol (VIVELLE-DOT) 0.025 MG/24HR Place 1 patch onto the skin 2 (two) times a week.     . irbesartan (AVAPRO) 300 MG tablet Take 300 mg by mouth daily.    Marland Kitchen omega-3 acid ethyl esters (LOVAZA) 1 G capsule Take 2 g by mouth daily.    Marland Kitchen omeprazole (PRILOSEC) 20 MG capsule Take 20 mg by mouth daily.    . ondansetron (ZOFRAN-ODT) 4 MG disintegrating tablet Take 4 mg by mouth as needed.     . predniSONE (DELTASONE) 5 MG tablet Take 3.5 mg by mouth daily with breakfast.     . sotalol (BETAPACE) 80 MG tablet Take 80 mg by mouth 2 (two) times daily.      Marland Kitchen venlafaxine (EFFEXOR-XR) 75 MG 24 hr capsule Take 75 mg by mouth Daily.      No current facility-administered medications on  file prior to visit.     Allergies  Allergen Reactions  . Atorvastatin Other (See Comments)    Muscle cramps severe  Other reaction(s): Cramps (ALLERGY/intolerance), Other Muscle cramps severe Muscle cramps severe   . Simvastatin Other (See Comments)    Severe muscle cramps  Other reaction(s): Cramps (ALLERGY/intolerance) Severe muscle cramps Severe muscle cramps   . Cephalosporins     Other reaction(s): Other, Other (See Comments)  . Nickel Rash    Other reaction(s): Other  . Sulfa Antibiotics     hives Other reaction(s): Other hives  . Sulfasalazine Hives    hives  . Eliquis [Apixaban] Hives and Rash  . Xarelto [Rivaroxaban] Hives and Rash    Past Medical History:  Diagnosis Date  . Asthma 1976  . Atrial fibrillation ([place])   . Bronchitis   . FUO (fever of unknown origin) 03/03/2015  . Hepatitis 1980s  . Hepatitis   . HTN (hypertension)   . Hypercholesterolemia   . Night sweat 03/03/2015  . PMR (polymyalgia rheumatica) ([place]) 03/15/2015  . 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 Polymyalgia ([place]) 03/03/2015  . Polymyositis ([place]) 03/03/2015    Past Surgical History:  Procedure Laterality Date  . APPENDECTOMY  1973  . FOOT SURGERY Left 2009  .  KNEE ARTHROPLASTY  2007   right  . OVARY SURGERY  2001  . TUBAL LIGATION  1972  . VAGINAL HYSTERECTOMY  1973    Social History   Tobacco Use  Smoking Status Former Smoker  . Packs/day: 0.20  . Years: 5.00  . Pack years: 1.00  . Types: Cigarettes  . Last attempt to quit: 09/23/1980  . Years since quitting: 38.1  Smokeless Tobacco Never Used    Social History   Substance and Sexual Activity  Alcohol Use Yes   Comment: 1 glass 5-6 times weekly    Family History  Problem Relation Age of Onset  . Hypertension Sister   . Lung cancer Father   . Hypertension Mother   . Multiple sclerosis Daughter   . Breast cancer Unknown        maternal aunt  . Allergies Daughter   . Allergies Daughter   . Allergies Son      Review of Systems: As noted in history of present illness.  All other systems were reviewed and are negative.  Physical Exam: There were no vitals taken for this visit. GENERAL:  Well appearing WF in NAD HEENT:  PERRL, EOMI, sclera are clear. Oropharynx is clear. NECK:  No jugular venous distention, carotid upstroke brisk and symmetric, no bruits, no thyromegaly or adenopathy LUNGS:  Clear to auscultation bilaterally CHEST:  Unremarkable HEART:  RRR,  PMI not displaced or sustained,S1 and S2 within normal limits, no S3, no S4: no clicks, no rubs, no murmurs ABD:  Soft, nontender. BS +, no masses or bruits. No hepatomegaly, no splenomegaly EXT:  2 + pulses throughout, no edema, no cyanosis no clubbing SKIN:  Warm and dry.  No rashes NEURO:  Alert and oriented x 3. Cranial nerves II through XII intact. PSYCH:  Cognitively intact    LBORATORY DATA:  Lab Results  Component Value Date   WBC 9.5 02/27/2015   HGB 10.5 (L) 02/27/2015   HCT 33.0 (L) 02/27/2015   PLT 354 02/27/2015   GLUCOSE 127 (H) 02/27/2015   ALT 43 02/27/2015   AST 25 02/27/2015   NA 133 (L) 02/27/2015   K 4.3 02/27/2015   CL 99 (L) 02/27/2015   CREATININE 0.82 02/27/2015   BUN 14 02/27/2015   CO2 24 02/27/2015   Labs dated 02/06/16: cholesterol 236, triglycerides 132, HDL 69, LDL 141. CMET and TSH normal.  November 27/2017: A1c 5.6%. Dated 02/12/17: cholesterol 251, triglycerides 226, HDL 59, LDL 147.  Dated 07/23/17: A1c 5.5% Dated 09/25/17: normal chemistries and TSH.  Ecg today: NSR rate 66 bpm. Qtc 473 msec.Normal Ecg.  I have personally reviewed and interpreted this study.   Assessment / Plan: 1. Atrial fibrillation, well controlled on sotalol. She has a [place] Vasc score of 3. Intolerant of Xarelto and possibly Eliquis due to drug rash. Will continue to monitor with Cardiomobile. Stay on ASA 81 mg daily.   2. Hypertension. Blood pressure is well controlled.   3. PMR on steroids.   4. OSA on  CPAP

## 2018-11-05 ENCOUNTER — Ambulatory Visit: Payer: PPO | Admitting: Cardiology

## 2018-11-23 DIAGNOSIS — G4733 Obstructive sleep apnea (adult) (pediatric): Secondary | ICD-10-CM | POA: Diagnosis not present

## 2018-12-03 DIAGNOSIS — H2512 Age-related nuclear cataract, left eye: Secondary | ICD-10-CM | POA: Diagnosis not present

## 2018-12-03 DIAGNOSIS — H25812 Combined forms of age-related cataract, left eye: Secondary | ICD-10-CM | POA: Diagnosis not present

## 2019-01-01 ENCOUNTER — Ambulatory Visit: Payer: PPO | Admitting: Cardiology

## 2019-01-18 ENCOUNTER — Telehealth: Payer: Self-pay | Admitting: Pulmonary Disease

## 2019-01-18 NOTE — Telephone Encounter (Signed)
Patient called regarding in need of sleep study to take to her dentist for oral appliance Advise patient that we would need to advise RA regarding this concern, and she will need a referral from our office sent over in order for insurance to help with the oral appliance cost. Pt agreed, and would like to think of this idea more Closing encounter, advise pt that we will reopen another encounter once she calls back at later time Pt expressed and verbalized understanding Nothing further needed

## 2019-02-01 DIAGNOSIS — Z7952 Long term (current) use of systemic steroids: Secondary | ICD-10-CM | POA: Diagnosis not present

## 2019-02-01 DIAGNOSIS — M353 Polymyalgia rheumatica: Secondary | ICD-10-CM | POA: Diagnosis not present

## 2019-02-23 DIAGNOSIS — H2513 Age-related nuclear cataract, bilateral: Secondary | ICD-10-CM | POA: Diagnosis not present

## 2019-02-23 DIAGNOSIS — G4733 Obstructive sleep apnea (adult) (pediatric): Secondary | ICD-10-CM | POA: Diagnosis not present

## 2019-03-11 DIAGNOSIS — R140 Abdominal distension (gaseous): Secondary | ICD-10-CM | POA: Diagnosis not present

## 2019-03-11 DIAGNOSIS — R194 Change in bowel habit: Secondary | ICD-10-CM | POA: Diagnosis not present

## 2019-03-12 DIAGNOSIS — R140 Abdominal distension (gaseous): Secondary | ICD-10-CM | POA: Diagnosis not present

## 2019-03-12 DIAGNOSIS — R194 Change in bowel habit: Secondary | ICD-10-CM | POA: Diagnosis not present

## 2019-03-29 DIAGNOSIS — E7849 Other hyperlipidemia: Secondary | ICD-10-CM | POA: Diagnosis not present

## 2019-03-29 DIAGNOSIS — E119 Type 2 diabetes mellitus without complications: Secondary | ICD-10-CM | POA: Diagnosis not present

## 2019-03-29 DIAGNOSIS — I10 Essential (primary) hypertension: Secondary | ICD-10-CM | POA: Diagnosis not present

## 2019-03-29 DIAGNOSIS — M859 Disorder of bone density and structure, unspecified: Secondary | ICD-10-CM | POA: Diagnosis not present

## 2019-04-01 DIAGNOSIS — R82998 Other abnormal findings in urine: Secondary | ICD-10-CM | POA: Diagnosis not present

## 2019-04-05 DIAGNOSIS — E119 Type 2 diabetes mellitus without complications: Secondary | ICD-10-CM | POA: Diagnosis not present

## 2019-04-05 DIAGNOSIS — E876 Hypokalemia: Secondary | ICD-10-CM | POA: Diagnosis not present

## 2019-04-05 DIAGNOSIS — I10 Essential (primary) hypertension: Secondary | ICD-10-CM | POA: Diagnosis not present

## 2019-04-05 DIAGNOSIS — M8580 Other specified disorders of bone density and structure, unspecified site: Secondary | ICD-10-CM | POA: Diagnosis not present

## 2019-04-05 DIAGNOSIS — M353 Polymyalgia rheumatica: Secondary | ICD-10-CM | POA: Diagnosis not present

## 2019-04-05 DIAGNOSIS — Z0000 Encounter for general adult medical examination without abnormal findings: Secondary | ICD-10-CM | POA: Diagnosis not present

## 2019-04-05 DIAGNOSIS — E785 Hyperlipidemia, unspecified: Secondary | ICD-10-CM | POA: Diagnosis not present

## 2019-04-05 DIAGNOSIS — I480 Paroxysmal atrial fibrillation: Secondary | ICD-10-CM | POA: Diagnosis not present

## 2019-04-05 DIAGNOSIS — K635 Polyp of colon: Secondary | ICD-10-CM | POA: Diagnosis not present

## 2019-04-05 DIAGNOSIS — L509 Urticaria, unspecified: Secondary | ICD-10-CM | POA: Diagnosis not present

## 2019-04-05 DIAGNOSIS — J309 Allergic rhinitis, unspecified: Secondary | ICD-10-CM | POA: Diagnosis not present

## 2019-04-05 DIAGNOSIS — G4733 Obstructive sleep apnea (adult) (pediatric): Secondary | ICD-10-CM | POA: Diagnosis not present

## 2019-04-20 ENCOUNTER — Telehealth: Payer: Self-pay | Admitting: Physician Assistant

## 2019-04-20 NOTE — Telephone Encounter (Signed)

## 2019-04-20 NOTE — Progress Notes (Signed)
Cardiology Office Note:    Date:  04/21/2019   ID:  [name] [name], DOB 1945/12/08, MRN [id number]  PCP:  Prince Solian, MD  Cardiologist:  Peter Martinique, MD   Referring MD: Prince Solian, MD   Chief Complaint  Patient presents with  . Palpitations    History of Present Illness:    [name] [name] is a 73 y.o. female with a hx of paroxysmal atrial fibrillation well-controlled with sotalol since 2003. Of note, toprol caused excessive fatigue and calcium channels were not helpful. She is currently not anticoagulated.  She previously developed a rash with a biopsy that was consistent with a drug reaction.  Xarelto was changed to eliquis without resolution of the rash. The rash finally resolved and pradaxa was offered, but she declined.  She takes 81 mg of aspirin only. She has OSA on CPAP.  She takes chronic steroids for polymyalgia rheumatica.  She also has hypertension, on irbesartan and hygroton, and hyperlipidemia, on lovaza. She is intolerant to statins.  She was last seen in clinic by Dr. Martinique in February 2019.  She was in normal sinus rhythm at that time.    She presents today with complaints of palpitations. She does have a cardiomobile.  She also has an apple watch. She states she is having problems with PVCs. She states it "isn't atrial."  She is having these palpitations 4-5 times per day lasting seconds at a time. They are worse when she lies down on her left side. She is not under stress. Decreasing caffeine has helped. Her grandson was in a fire accident and was in the burn [facility] at [facility] for three weeks, but she is not stressed about that now. She denies current anxiety. She also reports new increaesd fatigue. She had some DOE, which has subsided since she has started pilates. She naps every day which helps her fatigue. She is compliant on CPAP. She does not feel palpitations during pilates. She has been unable to decrease her current prednisone dose.    Past Medical History:   Diagnosis Date  . Asthma 1976  . Atrial fibrillation ([place])   . Bronchitis   . FUO (fever of unknown origin) 03/03/2015  . Hepatitis 1980s  . Hepatitis   . HTN (hypertension)   . Hypercholesterolemia   . Night sweat 03/03/2015  . PMR (polymyalgia rheumatica) ([place]) 03/15/2015  . 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 Polymyalgia ([place]) 03/03/2015  . Polymyositis ([place]) 03/03/2015    Past Surgical History:  Procedure Laterality Date  . APPENDECTOMY  1973  . FOOT SURGERY Left 2009  . KNEE ARTHROPLASTY  2007   right  . OVARY SURGERY  2001  . TUBAL LIGATION  1972  . VAGINAL HYSTERECTOMY  1973    Current Medications: Current Meds  Medication Sig  . albuterol (PROAIR HFA) 108 (90 Base) MCG/ACT inhaler Inhale 2 puffs into the lungs every 6 (six) hours as needed.  Marland Kitchen aspirin EC 81 MG tablet Take 81 mg by mouth daily.  . chlorthalidone (HYGROTON) 25 MG tablet Take 1 tablet (25 mg total) by mouth daily.  . Cholecalciferol (VITAMIN D3) LIQD Take 1,000 Units by mouth daily.   Marland Kitchen estradiol (VIVELLE-DOT) 0.025 MG/24HR Place 1 patch onto the skin 2 (two) times a week.   . irbesartan (AVAPRO) 300 MG tablet Take 300 mg by mouth daily.  Marland Kitchen omega-3 acid ethyl esters (LOVAZA) 1 G capsule Take 2 g by mouth daily.  Marland Kitchen omeprazole (PRILOSEC) 20 MG capsule Take 20 mg by  mouth daily.  . ondansetron (ZOFRAN-ODT) 4 MG disintegrating tablet Take 4 mg by mouth as needed.   . predniSONE (DELTASONE) 5 MG tablet Take 3.5 mg by mouth daily with breakfast.   . sotalol (BETAPACE) 80 MG tablet Take 80 mg by mouth 2 (two) times daily.    Marland Kitchen venlafaxine (EFFEXOR-XR) 75 MG 24 hr capsule Take 75 mg by mouth Daily.      Allergies:   Atorvastatin, Simvastatin, Cephalosporins, Nickel, Sulfa antibiotics, Sulfasalazine, Eliquis [apixaban], and Xarelto [rivaroxaban]   Social History   Socioeconomic History  . Marital status: Married    Spouse name: Not on file  . Number of children: 3  . Years of education: Not on file  . Highest  education level: Not on file  Occupational History  . Occupation: Programmer, multimedia: RETIRED  Social Needs  . Financial resource strain: Not on file  . Food insecurity    Worry: Not on file    Inability: Not on file  . Transportation needs    Medical: Not on file    Non-medical: Not on file  Tobacco Use  . Smoking status: Former Smoker    Packs/day: 0.20    Years: 5.00    Pack years: 1.00    Types: Cigarettes    Quit date: 09/23/1980    Years since quitting: 38.6  . Smokeless tobacco: Never Used  Substance and Sexual Activity  . Alcohol use: Yes    Comment: 1 glass 5-6 times weekly  . Drug use: No  . Sexual activity: Never  Lifestyle  . Physical activity    Days per week: Not on file    Minutes per session: Not on file  . Stress: Not on file  Relationships  . Social Herbalist on phone: Not on file    Gets together: Not on file    Attends religious service: Not on file    Active member of club or organization: Not on file    Attends meetings of clubs or organizations: Not on file    Relationship status: Not on file  Other Topics Concern  . Not on file  Social History Narrative  . Not on file     Family History: The patient's family history includes Allergies in her daughter, daughter, and son; Breast cancer in an other family member; Hypertension in her mother and sister; Lung cancer in her father; Multiple sclerosis in her daughter.  ROS:   Please see the history of present illness.     All other systems reviewed and are negative.  EKGs/Labs/Other Studies Reviewed:    The following studies were reviewed today:  none  EKG:  EKG is ordered today.  The ekg ordered today demonstrates sinus rhythm with HR 60 with one PVC  Recent Labs: No results found for requested labs within last 8760 hours.  Recent Lipid Panel No results found for: CHOL, TRIG, HDL, CHOLHDL, VLDL, LDLCALC, LDLDIRECT  Physical Exam:    VS:  BP 124/70   Pulse 60   Ht 5\' 3"  (1.6 m)    Wt 203 lb 12.8 oz (92.4 kg)   BMI 36.10 kg/m     Wt Readings from Last 3 Encounters:  04/21/19 203 lb 12.8 oz (92.4 kg)  09/14/18 199 lb 6.4 oz (90.4 kg)  08/17/18 200 lb (90.7 kg)     GEN: Well nourished, well developed in no acute distress HEENT: Normal NECK: No JVD; No carotid bruits CARDIAC: RRR, no murmurs,  rubs, gallops RESPIRATORY:  Clear to auscultation without rales, wheezing or rhonchi  ABDOMEN: Soft, non-tender, non-distended MUSCULOSKELETAL:  No edema; No deformity  SKIN: Warm and dry NEUROLOGIC:  Alert and oriented x 3 PSYCHIATRIC:  Normal affect   ASSESSMENT:    1. Palpitations   2. PVC (premature ventricular contraction)   3. Paroxysmal atrial fibrillation (HCC)   4. OSA (obstructive sleep apnea)   5. Hypercholesterolemia   6. Essential hypertension    PLAN:    In order of problems listed above:  Palpitations PAF Fatigue This patients CHA2DS2-VASc Score and unadjusted Ischemic Stroke Rate (% per year) is equal to 4.8 % stroke rate/year from a score of 4 (HTN, age1, female).  On review of past EKGs, she has been in sinus rhythm or sinus bradycardia. Today she is sinus rhythm with HR 60 and PVC. She did not feel palpitations during the EKG. Will place a zio patch to confirm absence of arrhythmia and to capture PVC burden. This may be the cause of her recent fatigue. TSH was just checked on 03/26/19 and was 1.46. Her vitamin D was low and she has restarted her supplement. I have asked her to start taking magnesium supplement to see if this helps with the palpitations. She will continue to monitor her caffeine intake. She is not anemic based on her 03/26/19 labs (I will have these scanned to media). Because she has been so stable, and at times bradycardia, I will not change her sotalol dose.    Hypertension Well controlled on hygroton and irbesartan. No medication changes.   Hyperlipidemia Recent lab work on 03/26/19: Total chol: 275 Triglycerides: 333 HDL: 63  LDL: 145 She is on lovaza. We discussed her triglycerides and she states this cholesterol panel was taken prior to her starting her pilates regimen. I have asked her to start taking zetia - she will start this 2 weeks after her magnesium supplement.      Medication Adjustments/Labs and Tests Ordered: Current medicines are reviewed at length with the patient today.  Concerns regarding medicines are outlined above.  Orders Placed This Encounter  Procedures  . Magnesium  . Comprehensive metabolic panel  . LONG TERM MONITOR (3-14 DAYS)  . EKG 12-Lead   Meds ordered this encounter  Medications  . ezetimibe (ZETIA) 10 MG tablet    Sig: Take 1 tablet (10 mg total) by mouth daily.    Dispense:  30 tablet    Refill:  1    Signed, Ledora Bottcher, [place]  04/21/2019 9:06 AM    [place]

## 2019-04-21 ENCOUNTER — Ambulatory Visit (INDEPENDENT_AMBULATORY_CARE_PROVIDER_SITE_OTHER): Payer: PPO | Admitting: Physician Assistant

## 2019-04-21 ENCOUNTER — Telehealth: Payer: Self-pay | Admitting: *Deleted

## 2019-04-21 ENCOUNTER — Other Ambulatory Visit: Payer: Self-pay

## 2019-04-21 ENCOUNTER — Encounter: Payer: Self-pay | Admitting: Physician Assistant

## 2019-04-21 VITALS — BP 124/70 | HR 60 | Ht 63.0 in | Wt 203.8 lb

## 2019-04-21 DIAGNOSIS — E7800 Pure hypercholesterolemia, unspecified: Secondary | ICD-10-CM

## 2019-04-21 DIAGNOSIS — G4733 Obstructive sleep apnea (adult) (pediatric): Secondary | ICD-10-CM

## 2019-04-21 DIAGNOSIS — I493 Ventricular premature depolarization: Secondary | ICD-10-CM

## 2019-04-21 DIAGNOSIS — I10 Essential (primary) hypertension: Secondary | ICD-10-CM

## 2019-04-21 DIAGNOSIS — I480 Paroxysmal atrial fibrillation: Secondary | ICD-10-CM | POA: Diagnosis not present

## 2019-04-21 DIAGNOSIS — R002 Palpitations: Secondary | ICD-10-CM

## 2019-04-21 MED ORDER — EZETIMIBE 10 MG PO TABS: 10.0000 mg | ORAL_TABLET | Freq: Every day | ORAL | 1 refills | Status: DC

## 2019-04-21 NOTE — Telephone Encounter (Signed)
14 ZIO XT long term holter monitor to be mailed to patients home.  Instructions reviewed briefly as they are included in the monitor kit. 

## 2019-04-21 NOTE — Patient Instructions (Signed)
Medication Instructions:  START Zetia 10 mg daily  If you need a refill on your cardiac medications before your next appointment, please call your pharmacy.   Lab work: Your physician recommends that you return for lab work today: CMET, Magnesium  If you have labs (blood work) drawn today and your tests are completely normal, you will receive your results only by: Marland Kitchen MyChart Message (if you have MyChart) OR . A paper copy in the mail If you have any lab test that is abnormal or we need to change your treatment, we will call you to review the results.  Testing/Procedures: Your physician has recommended that you wear a 14 day ZIO patch monitor. Event monitors are medical devices that record the heart's electrical activity. Doctors most often [place] these monitors to diagnose arrhythmias. Arrhythmias are problems with the speed or rhythm of the heartbeat. The monitor is a small, portable device. You can wear one while you do your normal daily activities. This is usually used to diagnose what is causing palpitations/syncope (passing out). This will be mailed to your home with detailed instructions.   Follow-Up: At [facility], you and your health needs are our priority.  As part of our continuing mission to provide you with exceptional heart care, we have created designated Provider Care Teams.  These Care Teams include your primary Cardiologist (physician) and Advanced Practice Providers (APPs -  Physician Assistants and Nurse Practitioners) who all work together to provide you with the care you need, when you need it. You will need a follow up appointment in 4 weeks.  Please call our office 2 months in advance to schedule this appointment.  You may see Peter Martinique, MD or one of the following Advanced Practice Providers on your designated Care Team: [place], [place] . Fabian Sharp, PA-C  Any Other Special Instructions Will Be Listed Below (If Applicable). None

## 2019-04-22 LAB — COMPREHENSIVE METABOLIC PANEL
ALT: 38 IU/L — ABNORMAL HIGH (ref 0–32)
AST: 37 IU/L (ref 0–40)
Albumin/Globulin Ratio: 1.6 (ref 1.2–2.2)
Albumin: 4.4 g/dL (ref 3.7–4.7)
Alkaline Phosphatase: 79 IU/L (ref 39–117)
BUN/Creatinine Ratio: 19 (ref 12–28)
BUN: 19 mg/dL (ref 8–27)
Bilirubin Total: 0.8 mg/dL (ref 0.0–1.2)
CO2: 26 mmol/L (ref 20–29)
Calcium: 9.4 mg/dL (ref 8.7–10.3)
Chloride: 95 mmol/L — ABNORMAL LOW (ref 96–106)
Creatinine, Ser: 0.99 mg/dL (ref 0.57–1.00)
GFR calc Af Amer: 64 mL/min/{1.73_m2} (ref >59)
GFR calc non Af Amer: 56 mL/min/{1.73_m2} — ABNORMAL LOW (ref >59)
Globulin, Total: 2.7 g/dL (ref 1.5–4.5)
Glucose: 109 mg/dL — ABNORMAL HIGH (ref 65–99)
Potassium: 3.8 mmol/L (ref 3.5–5.2)
Sodium: 138 mmol/L (ref 134–144)
Total Protein: 7.1 g/dL (ref 6.0–8.5)

## 2019-04-22 LAB — MAGNESIUM: Magnesium: 1.7 mg/dL (ref 1.6–2.3)

## 2019-04-25 ENCOUNTER — Ambulatory Visit (INDEPENDENT_AMBULATORY_CARE_PROVIDER_SITE_OTHER): Payer: PPO

## 2019-04-25 DIAGNOSIS — R002 Palpitations: Secondary | ICD-10-CM | POA: Diagnosis not present

## 2019-04-25 DIAGNOSIS — I493 Ventricular premature depolarization: Secondary | ICD-10-CM

## 2019-05-10 NOTE — Progress Notes (Signed)
Virtual Visit via Video Note   This visit type was conducted due to national recommendations for restrictions regarding the COVID-19 Pandemic (e.g. social distancing) in an effort to limit this patient's exposure and mitigate transmission in our community.  Due to her co-morbid illnesses, this patient is at least at moderate risk for complications without adequate follow up.  This format is felt to be most appropriate for this patient at this time.  All issues noted in this document were discussed and addressed.  A limited physical exam was performed with this format.  Please refer to the patient's chart for her consent to telehealth for [facility].   Date:  05/14/2019   ID:  [name] [name], DOB Dec 07, 1945, MRN [id number]  Patient Location: Home Provider Location: Home  PCP:  Prince Solian, MD  Cardiologist:  Skyler Carel [place], MD  Electrophysiologist:  None   Evaluation Performed:  Follow-Up Visit  Chief Complaint:  Atrial fibrillation  History of Present Illness:    [name] [name] is a 73 y.o. female with  a history of atrial fibrillation that has been well controlled with sotalol since 2003. Prior use of Toprol caused her to be very fatigued and calcium channel blockers did not help.   In 2018 she developed a rash. Skin biopsy was c/w a drug reaction. She was placed on steroids and Xarelto was stopped in favor of Eliquis. Rash did not clear on Eliquis and this was also stopped. Rash finally resolved. Patient given option of Pradaxa but she refused. Now only taking ASA 81 mg daily. On low dose steroids for  PMR. She has been diagnosed with OSA and is now on CPAP.  She was seen on July 29 for complaints of palpitations. This occurs more at night when lying on her left side. now occurring some during the day. Noted more PVCs on her Apple phone but no AFib. A Zio monitor was placed with results noted below. Recommended to take magnesium supplement and also started on Zetia for  hypercholesterolemia. Statin intolerant. She thinks the magnesium has helped some. She is now doing Pilates 3x/week.   The patient does not have symptoms concerning for COVID-19 infection (fever, chills, cough, or new shortness of breath).    Past Medical History:  Diagnosis Date   Asthma 1976   Atrial fibrillation ([place])    Bronchitis    FUO (fever of unknown origin) 03/03/2015   Hepatitis 1980s   Hepatitis    HTN (hypertension)    Hypercholesterolemia    Night sweat 03/03/2015   PMR (polymyalgia rheumatica) ([place]) 03/15/2015   Polyarthritis 03/03/2015   Polymyalgia ([place]) 03/03/2015   Polymyositis ([place]) 03/03/2015   Past Surgical History:  Procedure Laterality Date   APPENDECTOMY  1973   FOOT SURGERY Left 2009   KNEE ARTHROPLASTY  2007   right   OVARY SURGERY  2001   TUBAL LIGATION  1972   VAGINAL HYSTERECTOMY  1973     Current Meds  Medication Sig   albuterol (PROAIR HFA) 108 (90 Base) MCG/ACT inhaler Inhale 2 puffs into the lungs every 6 (six) hours as needed.   Ascorbic Acid (VITAMIN C) 1000 MG tablet Take 1,000 mg by mouth daily.   aspirin EC 81 MG tablet Take 81 mg by mouth daily.   B Complex Vitamins (B-COMPLEX/B-12) TABS Take by mouth daily.   chlorthalidone (HYGROTON) 25 MG tablet Take 1 tablet (25 mg total) by mouth daily.   Cholecalciferol (VITAMIN D3) LIQD Take 1,000 Units by mouth  daily.    estradiol (VIVELLE-DOT) 0.025 MG/24HR Place 1 patch onto the skin 2 (two) times a week.    ezetimibe (ZETIA) 10 MG tablet Take 1 tablet (10 mg total) by mouth daily.   irbesartan (AVAPRO) 300 MG tablet Take 300 mg by mouth daily.   Magnesium Oxide (MAG-OXIDE PO) Take 250 mg by mouth daily.   omega-3 acid ethyl esters (LOVAZA) 1 G capsule Take 2 g by mouth daily.   omeprazole (PRILOSEC) 20 MG capsule Take 20 mg by mouth daily.   ondansetron (ZOFRAN-ODT) 4 MG disintegrating tablet Take 4 mg by mouth as needed.    predniSONE (DELTASONE) 5 MG tablet  Take 5 mg by mouth daily with breakfast.   sotalol (BETAPACE) 80 MG tablet Take 80 mg by mouth 2 (two) times daily.     venlafaxine (EFFEXOR-XR) 75 MG 24 hr capsule Take 75 mg by mouth Daily.    Zinc 30 MG CAPS Take by mouth daily.   [DISCONTINUED] predniSONE (DELTASONE) 5 MG tablet Take 3.5 mg by mouth daily with breakfast.      Allergies:   Atorvastatin, Simvastatin, Cephalosporins, Nickel, Sulfa antibiotics, Sulfasalazine, Eliquis [apixaban], and Xarelto [rivaroxaban]   Social History   Tobacco Use   Smoking status: Former Smoker    Packs/day: 0.20    Years: 5.00    Pack years: 1.00    Types: Cigarettes    Quit date: 09/23/1980    Years since quitting: 38.6   Smokeless tobacco: Never Used  Substance Use Topics   Alcohol use: Yes    Comment: 1 glass 5-6 times weekly   Drug use: No     Family Hx: The patient's family history includes Allergies in her daughter, daughter, and son; Breast cancer in an other family member; Hypertension in her mother and sister; Lung cancer in her father; Multiple sclerosis in her daughter.  ROS:   Please see the history of present illness.    All other systems reviewed and are negative.   Prior CV studies:   The following studies were reviewed today:  Event monitor: Study Highlights   Normal sinus rhythm  Rare PVCs, trigeminy  One 6 beat run of NSVT  Rare PACs.  6 runs of SVT. longest 18 seconds at rate max 152. symptomatic.     Labs/Other Tests and Data Reviewed:    EKG:  No ECG reviewed.  Recent Labs: 04/21/2019: ALT 38; BUN 19; Creatinine, Ser 0.99; Magnesium 1.7; Potassium 3.8; Sodium 138   Recent Lipid Panel No results found for: CHOL, TRIG, HDL, CHOLHDL, LDLCALC, LDLDIRECT   Dated 03/29/19: cholesterol 275, triglycerides 333, HDL 63, LDL 145. A1c 5.3%.   Wt Readings from Last 3 Encounters:  05/14/19 197 lb (89.4 kg)  04/21/19 203 lb 12.8 oz (92.4 kg)  09/14/18 199 lb 6.4 oz (90.4 kg)     Objective:     Vital Signs:  BP 118/63    Pulse 62    Ht 5\' 3"  (1.6 m)    Wt 197 lb (89.4 kg)    BMI 34.90 kg/m    VITAL SIGNS:  reviewed  General: no distress HEENT normal Respirations unlabored. Neuro alert and oriented x 3.  Mood normal.  ASSESSMENT & PLAN:    1. Atrial fibrillation, well controlled on sotalol. She has a [place] Vasc score of 3. Intolerant of Xarelto and possibly Eliquis due to drug rash. No recurrence on event monitor. Stay on ASA 81 mg daily.   2. Hypertension. Blood pressure is well  controlled.   3. PMR on steroids.   4. OSA on CPAP  5. PVCs and short runs of SVT. Intolerant of beta blockers and calcium channel blockers in the past. No sustained arrhythmia. Symptoms better on magnesium. Will monitor. On Sotalol  6. HLD mixed. Now on Zetia. Statin intolerant. Encouraged increased activity and weight loss. Add fish oil for elevated triglycerides.  COVID-19 Education: The signs and symptoms of COVID-19 were discussed with the patient and how to seek care for testing (follow up with PCP or arrange E-visit).  The importance of social distancing was discussed today.  Time:   Today, I have spent 15 minutes with the patient with telehealth technology discussing the above problems.     Medication Adjustments/Labs and Tests Ordered: Current medicines are reviewed at length with the patient today.  Concerns regarding medicines are outlined above.   Tests Ordered: No orders of the defined types were placed in this encounter.   Medication Changes: No orders of the defined types were placed in this encounter.   Follow Up:  In Person in 6 month(s)  Signed, Alexsander Cavins [place], MD  05/14/2019 8:12 AM    [place]

## 2019-05-11 DIAGNOSIS — I493 Ventricular premature depolarization: Secondary | ICD-10-CM | POA: Diagnosis not present

## 2019-05-11 DIAGNOSIS — R002 Palpitations: Secondary | ICD-10-CM | POA: Diagnosis not present

## 2019-05-14 ENCOUNTER — Encounter: Payer: Self-pay | Admitting: Cardiology

## 2019-05-14 ENCOUNTER — Telehealth (INDEPENDENT_AMBULATORY_CARE_PROVIDER_SITE_OTHER): Payer: PPO | Admitting: Cardiology

## 2019-05-14 VITALS — BP 118/63 | HR 62 | Ht 63.0 in | Wt 197.0 lb

## 2019-05-14 DIAGNOSIS — I10 Essential (primary) hypertension: Secondary | ICD-10-CM

## 2019-05-14 DIAGNOSIS — I471 Supraventricular tachycardia: Secondary | ICD-10-CM

## 2019-05-14 DIAGNOSIS — E782 Mixed hyperlipidemia: Secondary | ICD-10-CM | POA: Diagnosis not present

## 2019-05-14 DIAGNOSIS — I480 Paroxysmal atrial fibrillation: Secondary | ICD-10-CM

## 2019-05-14 DIAGNOSIS — I493 Ventricular premature depolarization: Secondary | ICD-10-CM

## 2019-05-14 NOTE — Patient Instructions (Signed)
Medication Instructions:  Continue same medications If you need a refill on your cardiac medications before your next appointment, please call your pharmacy.   Lab work: None ordered   Testing/Procedures: None ordered  Follow-Up: At [facility], you and your health needs are our priority.  As part of our continuing mission to provide you with exceptional heart care, we have created designated Provider Care Teams.  These Care Teams include your primary Cardiologist (physician) and Advanced Practice Providers (APPs -  Physician Assistants and Nurse Practitioners) who all work together to provide you with the care you need, when you need it. . Schedule follow up appointment in 6 months   Call in Nov to schedule Feb appointment    

## 2019-05-17 ENCOUNTER — Other Ambulatory Visit: Payer: Self-pay

## 2019-05-17 MED ORDER — EZETIMIBE 10 MG PO TABS: 10.0000 mg | ORAL_TABLET | Freq: Every day | ORAL | 1 refills | Status: DC

## 2019-06-04 DIAGNOSIS — Z20818 Contact with and (suspected) exposure to other bacterial communicable diseases: Secondary | ICD-10-CM | POA: Diagnosis not present

## 2019-06-04 DIAGNOSIS — R05 Cough: Secondary | ICD-10-CM | POA: Diagnosis not present

## 2019-06-04 DIAGNOSIS — J019 Acute sinusitis, unspecified: Secondary | ICD-10-CM | POA: Diagnosis not present

## 2019-06-04 DIAGNOSIS — Z20828 Contact with and (suspected) exposure to other viral communicable diseases: Secondary | ICD-10-CM | POA: Diagnosis not present

## 2019-06-08 DIAGNOSIS — K589 Irritable bowel syndrome without diarrhea: Secondary | ICD-10-CM | POA: Insufficient documentation

## 2019-06-08 DIAGNOSIS — N951 Menopausal and female climacteric states: Secondary | ICD-10-CM | POA: Diagnosis not present

## 2019-06-08 DIAGNOSIS — Z6835 Body mass index (BMI) 35.0-35.9, adult: Secondary | ICD-10-CM | POA: Diagnosis not present

## 2019-06-08 DIAGNOSIS — Z01419 Encounter for gynecological examination (general) (routine) without abnormal findings: Secondary | ICD-10-CM | POA: Diagnosis not present

## 2019-06-10 ENCOUNTER — Other Ambulatory Visit: Payer: Self-pay

## 2019-06-10 MED ORDER — EZETIMIBE 10 MG PO TABS: 10.0000 mg | ORAL_TABLET | Freq: Every day | ORAL | 1 refills | Status: DC

## 2019-07-06 ENCOUNTER — Other Ambulatory Visit: Payer: Self-pay

## 2019-07-06 DIAGNOSIS — Z1231 Encounter for screening mammogram for malignant neoplasm of breast: Secondary | ICD-10-CM | POA: Diagnosis not present

## 2019-07-06 DIAGNOSIS — Z803 Family history of malignant neoplasm of breast: Secondary | ICD-10-CM | POA: Diagnosis not present

## 2019-07-06 MED ORDER — EZETIMIBE 10 MG PO TABS: 10.0000 mg | ORAL_TABLET | Freq: Every day | ORAL | 1 refills | Status: DC

## 2019-08-04 DIAGNOSIS — E669 Obesity, unspecified: Secondary | ICD-10-CM | POA: Diagnosis not present

## 2019-08-04 DIAGNOSIS — Z6835 Body mass index (BMI) 35.0-35.9, adult: Secondary | ICD-10-CM | POA: Diagnosis not present

## 2019-08-04 DIAGNOSIS — R5383 Other fatigue: Secondary | ICD-10-CM | POA: Diagnosis not present

## 2019-08-04 DIAGNOSIS — M353 Polymyalgia rheumatica: Secondary | ICD-10-CM | POA: Diagnosis not present

## 2019-08-04 DIAGNOSIS — Z7952 Long term (current) use of systemic steroids: Secondary | ICD-10-CM | POA: Diagnosis not present

## 2019-08-12 DIAGNOSIS — I480 Paroxysmal atrial fibrillation: Secondary | ICD-10-CM | POA: Diagnosis not present

## 2019-08-12 DIAGNOSIS — I10 Essential (primary) hypertension: Secondary | ICD-10-CM | POA: Diagnosis not present

## 2019-08-12 DIAGNOSIS — M353 Polymyalgia rheumatica: Secondary | ICD-10-CM | POA: Diagnosis not present

## 2019-08-12 DIAGNOSIS — E119 Type 2 diabetes mellitus without complications: Secondary | ICD-10-CM | POA: Diagnosis not present

## 2019-08-12 DIAGNOSIS — G4733 Obstructive sleep apnea (adult) (pediatric): Secondary | ICD-10-CM | POA: Diagnosis not present

## 2019-08-12 DIAGNOSIS — E785 Hyperlipidemia, unspecified: Secondary | ICD-10-CM | POA: Diagnosis not present

## 2019-08-12 DIAGNOSIS — J309 Allergic rhinitis, unspecified: Secondary | ICD-10-CM | POA: Diagnosis not present

## 2019-08-13 DIAGNOSIS — E119 Type 2 diabetes mellitus without complications: Secondary | ICD-10-CM | POA: Diagnosis not present

## 2019-09-07 ENCOUNTER — Encounter: Payer: Self-pay | Admitting: Pulmonary Disease

## 2019-09-07 ENCOUNTER — Ambulatory Visit (INDEPENDENT_AMBULATORY_CARE_PROVIDER_SITE_OTHER): Payer: PPO | Admitting: Pulmonary Disease

## 2019-09-07 ENCOUNTER — Other Ambulatory Visit: Payer: Self-pay

## 2019-09-07 DIAGNOSIS — J4520 Mild intermittent asthma, uncomplicated: Secondary | ICD-10-CM | POA: Diagnosis not present

## 2019-09-07 DIAGNOSIS — G4733 Obstructive sleep apnea (adult) (pediatric): Secondary | ICD-10-CM

## 2019-09-07 NOTE — Progress Notes (Signed)
   Subjective:    Patient ID: [name] [name], female    DOB: 11-22-1945, 73 y.o.   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 Polymyalgia rheumatica was diagnosed in 2016, good response to steroids. She has mild persistent asthma,currently off inhaled steroids.   Annual follow-up. Breathing has been stable, no intercurrent flares, she has been off inhaled steroids for a couple of years.  Still maintained on 5 mg of prednisone for polymyalgia and has been unable to taper herself. She is on vitamin D and calcium supplements  CPAP is working well, denies daytime somnolence and fatigue, she is compliant by report and this is confirmed on download, more than 8 hours every night, no residual events on average CPAP of 12 cm on auto settings 5 to 12 cm with mild leak.  She has settled down with nasal mask No problems with dryness   Significant tests/ events reviewed  HST 05/2017 AHI 32/h  Review of Systems neg for any significant sore throat, dysphagia, itching, sneezing, nasal congestion or excess/ purulent secretions, fever, chills, sweats, unintended wt loss, pleuritic or exertional cp, hempoptysis, orthopnea pnd or change in chronic leg swelling. Also denies presyncope, palpitations, heartburn, abdominal pain, nausea, vomiting, diarrhea or change in bowel or urinary habits, dysuria,hematuria, rash, arthralgias, visual complaints, headache, numbness weakness or ataxia.     Objective:   Physical Exam   Gen. Pleasant, well-nourished, in no distress ENT - no thrush, no pallor/icterus,no post nasal drip Neck: No JVD, no thyromegaly, no carotid bruits Lungs: no use of accessory muscles, no dullness to percussion, clear without rales or rhonchi  Cardiovascular: Rhythm regular, heart sounds  normal, no murmurs or gallops, no peripheral edema  Musculoskeletal: No deformities, no cyanosis or clubbing         Assessment & Plan:

## 2019-09-07 NOTE — Assessment & Plan Note (Signed)
She is compliant with CPAP and this is certainly helped improve her daytime somnolence and fatigue CPAP supplies will be renewed for a year

## 2019-09-07 NOTE — Addendum Note (Signed)
Addended by: Nena Polio on: 09/07/2019 05:28 PM   Modules accepted: Orders

## 2019-09-07 NOTE — Assessment & Plan Note (Signed)
Would reclassify this as mild intermittent since she has not had any flareups now for a few years Possible that she is well controlled because she has been on oral steroids for polymyalgia. Have asked her to discuss bone density imaging & bisphosphonates with PCP since she is going to be maintained on steroids  Continue to use albuterol on an as-needed basis

## 2019-09-07 NOTE — Patient Instructions (Signed)
CPAP is working well on current settings. CPAP supplies will be renewed for a year.  Stay healthy! Merry Christmas!

## 2019-10-19 ENCOUNTER — Ambulatory Visit (INDEPENDENT_AMBULATORY_CARE_PROVIDER_SITE_OTHER): Payer: PPO

## 2019-10-19 ENCOUNTER — Ambulatory Visit: Payer: PPO | Admitting: Podiatry

## 2019-10-19 ENCOUNTER — Encounter: Payer: Self-pay | Admitting: Podiatry

## 2019-10-19 ENCOUNTER — Other Ambulatory Visit: Payer: Self-pay

## 2019-10-19 ENCOUNTER — Other Ambulatory Visit: Payer: Self-pay | Admitting: Podiatry

## 2019-10-19 DIAGNOSIS — G5792 Unspecified mononeuropathy of left lower limb: Secondary | ICD-10-CM

## 2019-10-19 DIAGNOSIS — M7751 Other enthesopathy of right foot: Secondary | ICD-10-CM

## 2019-10-19 DIAGNOSIS — M778 Other enthesopathies, not elsewhere classified: Secondary | ICD-10-CM

## 2019-10-20 NOTE — Progress Notes (Signed)
Subjective:  Patient ID: [name] [name], female    DOB: 04-29-1946,  MRN: [id number] HPI Chief Complaint  Patient presents with  . Foot Pain    Plantar forefoot left - aching, "zapping-like" sensations x few weeks  . Ankle Injury    Lateral ankle right - twisted ankle x 6 weeks ago, still hurting when walking  . New Patient (Initial Visit)    Est pt 10/2016    74 y.o. female presents with the above complaint.   ROS: Denies fever chills nausea vomiting muscle aches pains calf pain back pain chest pain shortness of breath.  Past Medical History:  Diagnosis Date  . Asthma 1976  . Atrial fibrillation ([place])   . Bronchitis   . FUO (fever of unknown origin) 03/03/2015  . Hepatitis 1980s  . Hepatitis   . HTN (hypertension)   . Hypercholesterolemia   . Night sweat 03/03/2015  . PMR (polymyalgia rheumatica) ([place]) 03/15/2015  . 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 Polymyalgia ([place]) 03/03/2015  . Polymyositis ([place]) 03/03/2015   Past Surgical History:  Procedure Laterality Date  . APPENDECTOMY  1973  . FOOT SURGERY Left 2009  . KNEE ARTHROPLASTY  2007   right  . OVARY SURGERY  2001  . TUBAL LIGATION  1972  . VAGINAL HYSTERECTOMY  1973    Current Outpatient Medications:  .  albuterol (PROAIR HFA) 108 (90 Base) MCG/ACT inhaler, Inhale 2 puffs into the lungs every 6 (six) hours as needed., Disp: 1 Inhaler, Rfl: 5 .  Ascorbic Acid (VITAMIN C) 1000 MG tablet, Take 1,000 mg by mouth daily., Disp: , Rfl:  .  aspirin EC 81 MG tablet, Take 81 mg by mouth daily., Disp: , Rfl:  .  Azelastine HCl 0.15 % SOLN, Place 2 sprays into both nostrils 2 (two) times daily., Disp: , Rfl:  .  B Complex Vitamins (B-COMPLEX/B-12) TABS, Take by mouth daily., Disp: , Rfl:  .  chlorthalidone (HYGROTON) 25 MG tablet, Take 1 tablet (25 mg total) by mouth daily., Disp: 90 tablet, Rfl: 1 .  Cholecalciferol (VITAMIN D3) LIQD, Take 1,000 Units by mouth daily. , Disp: , Rfl:  .  estradiol (VIVELLE-DOT) 0.05 MG/24HR patch,  Place 1 patch onto the skin 2 (two) times a week., Disp: , Rfl:  .  etodolac (LODINE) 400 MG tablet, Take 400 mg by mouth 3 (three) times daily., Disp: , Rfl:  .  ezetimibe (ZETIA) 10 MG tablet, Take 1 tablet (10 mg total) by mouth daily., Disp: 30 tablet, Rfl: 1 .  fluticasone (FLONASE) 50 MCG/ACT nasal spray, Place 2 sprays into both nostrils daily., Disp: , Rfl:  .  irbesartan (AVAPRO) 300 MG tablet, Take 300 mg by mouth daily., Disp: , Rfl:  .  Magnesium Oxide (MAG-OXIDE PO), Take 250 mg by mouth daily., Disp: , Rfl:  .  montelukast (SINGULAIR) 10 MG tablet, Take 10 mg by mouth daily., Disp: , Rfl:  .  Multiple Vitamin (MULTI-VITAMINS) TABS, Take by mouth., Disp: , Rfl:  .  omega-3 acid ethyl esters (LOVAZA) 1 G capsule, Take 2 g by mouth daily., Disp: , Rfl:  .  omeprazole (PRILOSEC) 20 MG capsule, Take 20 mg by mouth daily., Disp: , Rfl:  .  ondansetron (ZOFRAN-ODT) 4 MG disintegrating tablet, Take 4 mg by mouth as needed. , Disp: , Rfl:  .  predniSONE (DELTASONE) 5 MG tablet, Take 5 mg by mouth daily with breakfast., Disp: , Rfl:  .  sotalol (BETAPACE) 80 MG tablet, Take 80 mg by  mouth 2 (two) times daily.  , Disp: , Rfl:  .  venlafaxine (EFFEXOR-XR) 75 MG 24 hr capsule, Take 75 mg by mouth Daily. , Disp: , Rfl:  .  Zinc 30 MG CAPS, Take by mouth daily., Disp: , Rfl:   Allergies  Allergen Reactions  . Atorvastatin Other (See Comments)    Muscle cramps severe  Other reaction(s): Cramps (ALLERGY/intolerance), Other Muscle cramps severe Muscle cramps severe   . Simvastatin Other (See Comments)    Severe muscle cramps  Other reaction(s): Cramps (ALLERGY/intolerance) Severe muscle cramps Severe muscle cramps   . Cephalosporins     Other reaction(s): Other, Other (See Comments)  . Nickel Rash    Other reaction(s): Other  . Sulfa Antibiotics     hives Other reaction(s): Other hives  . Sulfasalazine Hives    hives  . Eliquis [Apixaban] Hives and Rash  . Xarelto [Rivaroxaban]  Hives and Rash   Review of Systems Objective:  There were no vitals filed for this visit.  General: Well developed, nourished, in no acute distress, alert and oriented x3   Dermatological: Skin is warm, dry and supple bilateral. Nails x 10 are well maintained; remaining integument appears unremarkable at this time. There are no open sores, no preulcerative lesions, no rash or signs of infection present.  Vascular: Dorsalis Pedis artery and Posterior Tibial artery pedal pulses are 2/4 bilateral with immedate capillary fill time. Pedal hair growth present. No varicosities and no lower extremity edema present bilateral.   Neruologic: Grossly intact via light touch bilateral. Vibratory intact via tuning fork bilateral. Protective threshold with Semmes Wienstein monofilament intact to all pedal sites bilateral. Patellar and Achilles deep tendon reflexes 2+ bilateral. No Babinski or clonus noted bilateral.  She has pain on palpation to tibial sesamoid and first interdigital space of the left foot.  Musculoskeletal: No gross boney pedal deformities bilateral. No pain, crepitus, or limitation noted with foot and ankle range of motion bilateral. Muscular strength 5/5 in all groups tested bilateral.  She has no pain on palpation of the anterior talofibular ligament and calcaneofibular ligament.  She does have pain on palpation to the floor the sinus tarsi of the right foot.  She also has pain on end range of motion of the subtalar joint.  Gait: Unassisted, Nonantalgic.    Radiographs:  Radiographs do not demonstrate any acute findings they do demonstrate osseously mature individual.  Foot is rectus toes are rectus no fractures identified no acute findings.  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    Plan: After sterile Betadine skin prep I injected both  sites 10 mg of Kenalog 5 mg Marcaine point maximal tenderness left first intermetatarsal space subtalar joint with 20 mg Kenalog 5 mg Marcaine point of maximal tenderness.  Tolerated procedure well without complications discussed appropriate shoe gear stretching exercises and ice therapy I will follow-up with her in 1 month.     Albertia Carvin T. [place], [place]

## 2019-10-31 ENCOUNTER — Ambulatory Visit: Payer: PPO

## 2019-11-04 ENCOUNTER — Other Ambulatory Visit: Payer: Self-pay

## 2019-11-04 ENCOUNTER — Encounter: Payer: Self-pay | Admitting: Podiatry

## 2019-11-04 ENCOUNTER — Ambulatory Visit: Payer: PPO | Admitting: Podiatry

## 2019-11-04 DIAGNOSIS — M7751 Other enthesopathy of right foot: Secondary | ICD-10-CM

## 2019-11-04 DIAGNOSIS — G5792 Unspecified mononeuropathy of left lower limb: Secondary | ICD-10-CM

## 2019-11-04 NOTE — Progress Notes (Signed)
She presents today for follow-up of her perineal neuritis sesamoiditis left foot which she states is doing much better and for follow-up of her subtalar joint capsulitis right foot which still bothers her some degree.  Objective: Vital signs are stable she alert oriented x3 there is no erythema edema cellulitis drainage or odor no reproducible pain left foot.  The only reproducible pain in the right foot is at the distalmost aspect of the fibula near the floor of the sinus tarsi.  Assessment: Sinus tarsitis capsulitis very localized subtalar joint right.  Plan: Injected that with area with dexamethasone and local anesthetic discussed appropriate shoe gear stressed exercise ice therapy shoe modifications follow-up with her in 1 month if necessary.

## 2019-11-11 ENCOUNTER — Telehealth: Payer: Self-pay

## 2019-11-11 MED ORDER — EZETIMIBE 10 MG PO TABS: 10.0000 mg | ORAL_TABLET | Freq: Every day | ORAL | 3 refills | Status: DC

## 2019-11-11 NOTE — Telephone Encounter (Signed)
Spoke to patient advised appointment scheduled 2/19 at 10:20 am with Dr.Jordan has been cancelled due to the weather.I will call back to reschedule.Stated she was doing good no problems except Zetia needs refilled.Refill sent to pharmacy.

## 2019-11-11 NOTE — Progress Notes (Deleted)
[name] [name] Date of Birth: 04-Oct-1945   History of Present Illness: [name] [name] is seen for  followup. She has a history of atrial fibrillation that has been well controlled with sotalol since 2003. Prior use of Toprol caused her to be very fatigued and calcium channel blockers did not help.   In 2018 she developed a rash. Skin biopsy was c/w a drug reaction. She was placed on steroids and Xarelto was stopped in favor of Eliquis. Rash did not clear on Eliquis and this was also stopped. Rash finally resolved. Patient given option of Pradaxa but she refused. Now only taking ASA 81 mg daily. On low dose steroids for  PMR. She has been diagnosed with OSA and is now on CPAP.  She was seen on July 29 for complaints of palpitations. This occurs more at night when lying on her left side. now occurring some during the day. Noted more PVCs on her Apple phone but no AFib. A Zio monitor was placed with results noted below. Recommended to take magnesium supplement and also started on Zetia for hypercholesterolemia. Statin intolerant. She thinks the magnesium has helped some. She is now doing Pilates 3x/week.    Current Outpatient Medications on File Prior to Visit  Medication Sig Dispense Refill  . albuterol (PROAIR HFA) 108 (90 Base) MCG/ACT inhaler Inhale 2 puffs into the lungs every 6 (six) hours as needed. 1 Inhaler 5  . Ascorbic Acid (VITAMIN C) 1000 MG tablet Take 1,000 mg by mouth daily.    Marland Kitchen aspirin EC 81 MG tablet Take 81 mg by mouth daily.    . Azelastine HCl 0.15 % SOLN Place 2 sprays into both nostrils 2 (two) times daily.    . B Complex Vitamins (B-COMPLEX/B-12) TABS Take by mouth daily.    . chlorthalidone (HYGROTON) 25 MG tablet Take 1 tablet (25 mg total) by mouth daily. 90 tablet 1  . Cholecalciferol (VITAMIN D3) LIQD Take 1,000 Units by mouth daily.     Marland Kitchen estradiol (VIVELLE-DOT) 0.05 MG/24HR patch Place 1 patch onto the skin 2 (two) times a week.    . etodolac (LODINE) 400 MG tablet  Take 400 mg by mouth 3 (three) times daily.    Marland Kitchen ezetimibe (ZETIA) 10 MG tablet Take 1 tablet (10 mg total) by mouth daily. 30 tablet 1  . fluticasone (FLONASE) 50 MCG/ACT nasal spray Place 2 sprays into both nostrils daily.    . irbesartan (AVAPRO) 300 MG tablet Take 300 mg by mouth daily.    . Magnesium Oxide (MAG-OXIDE PO) Take 250 mg by mouth daily.    . montelukast (SINGULAIR) 10 MG tablet Take 10 mg by mouth daily.    . Multiple Vitamin (MULTI-VITAMINS) TABS Take by mouth.    . omega-3 acid ethyl esters (LOVAZA) 1 G capsule Take 2 g by mouth daily.    Marland Kitchen omeprazole (PRILOSEC) 20 MG capsule Take 20 mg by mouth daily.    . ondansetron (ZOFRAN-ODT) 4 MG disintegrating tablet Take 4 mg by mouth as needed.     . predniSONE (DELTASONE) 5 MG tablet Take 5 mg by mouth daily with breakfast.    . sotalol (BETAPACE) 80 MG tablet Take 80 mg by mouth 2 (two) times daily.      Marland Kitchen venlafaxine (EFFEXOR-XR) 75 MG 24 hr capsule Take 75 mg by mouth Daily.     . Zinc 30 MG CAPS Take by mouth daily.     No current facility-administered medications on file prior to visit.  Allergies  Allergen Reactions  . Atorvastatin Other (See Comments)    Muscle cramps severe  Other reaction(s): Cramps (ALLERGY/intolerance), Other Muscle cramps severe Muscle cramps severe   . Simvastatin Other (See Comments)    Severe muscle cramps  Other reaction(s): Cramps (ALLERGY/intolerance) Severe muscle cramps Severe muscle cramps   . Cephalosporins     Other reaction(s): Other, Other (See Comments)  . Nickel Rash    Other reaction(s): Other  . Sulfa Antibiotics     hives Other reaction(s): Other hives  . Sulfasalazine Hives    hives  . Eliquis [Apixaban] Hives and Rash  . Xarelto [Rivaroxaban] Hives and Rash    Past Medical History:  Diagnosis Date  . Asthma 1976  . Atrial fibrillation ([place])   . Bronchitis   . FUO (fever of unknown origin) 03/03/2015  . Hepatitis 1980s  . Hepatitis   . HTN  (hypertension)   . Hypercholesterolemia   . Night sweat 03/03/2015  . PMR (polymyalgia rheumatica) ([place]) 03/15/2015  . 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 Polymyalgia ([place]) 03/03/2015  . Polymyositis ([place]) 03/03/2015    Past Surgical History:  Procedure Laterality Date  . APPENDECTOMY  1973  . FOOT SURGERY Left 2009  . KNEE ARTHROPLASTY  2007   right  . OVARY SURGERY  2001  . TUBAL LIGATION  1972  . VAGINAL HYSTERECTOMY  1973    Social History   Tobacco Use  Smoking Status Former Smoker  . Packs/day: 0.20  . Years: 5.00  . Pack years: 1.00  . Types: Cigarettes  . Quit date: 09/23/1980  . Years since quitting: 39.1  Smokeless Tobacco Never Used    Social History   Substance and Sexual Activity  Alcohol Use Yes   Comment: 1 glass 5-6 times weekly    Family History  Problem Relation Age of Onset  . Hypertension Sister   . Lung cancer Father   . Hypertension Mother   . Multiple sclerosis Daughter   . Breast cancer Other        maternal aunt  . Allergies Daughter   . Allergies Daughter   . Allergies Son     Review of Systems: As noted in history of present illness.  All other systems were reviewed and are negative.  Physical Exam: There were no vitals taken for this visit. GENERAL:  Well appearing WF in NAD HEENT:  PERRL, EOMI, sclera are clear. Oropharynx is clear. NECK:  No jugular venous distention, carotid upstroke brisk and symmetric, no bruits, no thyromegaly or adenopathy LUNGS:  Clear to auscultation bilaterally CHEST:  Unremarkable HEART:  RRR,  PMI not displaced or sustained,S1 and S2 within normal limits, no S3, no S4: no clicks, no rubs, no murmurs ABD:  Soft, nontender. BS +, no masses or bruits. No hepatomegaly, no splenomegaly EXT:  2 + pulses throughout, no edema, no cyanosis no clubbing SKIN:  Warm and dry.  No rashes NEURO:  Alert and oriented x 3. Cranial nerves II through XII intact. PSYCH:  Cognitively intact    LBORATORY DATA:  Lab Results   Component Value Date   WBC 9.5 02/27/2015   HGB 10.5 (L) 02/27/2015   HCT 33.0 (L) 02/27/2015   PLT 354 02/27/2015   GLUCOSE 109 (H) 04/21/2019   ALT 38 (H) 04/21/2019   AST 37 04/21/2019   NA 138 04/21/2019   K 3.8 04/21/2019   CL 95 (L) 04/21/2019   CREATININE 0.99 04/21/2019   BUN 19 04/21/2019   CO2 26 04/21/2019  Labs dated 02/06/16: cholesterol 236, triglycerides 132, HDL 69, LDL 141. CMET and TSH normal.  November 27/2017: A1c 5.6%. Dated 02/12/17: cholesterol 251, triglycerides 226, HDL 59, LDL 147.  Dated 07/23/17: A1c 5.5% Dated 09/25/17: normal chemistries and TSH. Dated 03/29/19: cholesterol 275, triglycerides 333, HDL 63, LDL 145. A1c 5.3%.   Ecg today: NSR rate 66 bpm. Qtc 473 msec.Normal Ecg.  I have personally reviewed and interpreted this study.  Event monitor 05/12/19: Study Highlights   Normal sinus rhythm  Rare PVCs, trigeminy  One 6 beat run of NSVT  Rare PACs.  6 runs of SVT. longest 18 seconds at rate max 152. symptomatic.      Assessment / Plan: 1. Atrial fibrillation, well controlled on sotalol. She has a [place] Vasc score of 3. Intolerant of Xarelto and possibly Eliquis due to drug rash. Will continue to monitor with Cardi[employer]. Stay on ASA 81 mg daily.   2. Hypertension. Blood pressure is well controlled.   3. PMR on steroids.   4. OSA on CPAP  5. PVCs and short runs of SVT. Intolerant of beta blockers and calcium channel blockers in the past. No sustained arrhythmia. Symptoms better on magnesium. Will monitor. On Sotalol  6. HLD mixed. Now on Zetia. Statin intolerant. Encouraged increased activity and weight loss. Add fish oil for elevated triglycerides.

## 2019-11-12 ENCOUNTER — Ambulatory Visit: Payer: PPO | Admitting: Cardiology

## 2019-11-17 ENCOUNTER — Ambulatory Visit: Payer: PPO

## 2019-11-18 DIAGNOSIS — R42 Dizziness and giddiness: Secondary | ICD-10-CM | POA: Diagnosis not present

## 2019-11-18 DIAGNOSIS — I10 Essential (primary) hypertension: Secondary | ICD-10-CM | POA: Diagnosis not present

## 2019-11-18 DIAGNOSIS — J309 Allergic rhinitis, unspecified: Secondary | ICD-10-CM | POA: Diagnosis not present

## 2019-11-18 DIAGNOSIS — I480 Paroxysmal atrial fibrillation: Secondary | ICD-10-CM | POA: Diagnosis not present

## 2019-11-18 DIAGNOSIS — E119 Type 2 diabetes mellitus without complications: Secondary | ICD-10-CM | POA: Diagnosis not present

## 2019-11-18 NOTE — Progress Notes (Deleted)
[name] [name] Date of Birth: 1945-09-25   History of Present Illness: [name] [name] is seen for  followup. She has a history of atrial fibrillation that has been well controlled with sotalol since 2003. Prior use of Toprol caused her to be very fatigued and calcium channel blockers did not help.   In 2018 she developed a rash. Skin biopsy was c/w a drug reaction. She was placed on steroids and Xarelto was stopped in favor of Eliquis. Rash did not clear on Eliquis and this was also stopped. Rash finally resolved. Patient given option of Pradaxa but she refused. Now only taking ASA 81 mg daily. On low dose steroids for  PMR. She has been diagnosed with OSA and is now on CPAP.  She was seen on July 29 for complaints of palpitations. This occurs more at night when lying on her left side. now occurring some during the day. Noted more PVCs on her Apple phone but no AFib. A Zio monitor was placed with results noted below. Recommended to take magnesium supplement and also started on Zetia for hypercholesterolemia. Statin intolerant. She thinks the magnesium has helped some. She is now doing Pilates 3x/week.    Current Outpatient Medications on File Prior to Visit  Medication Sig Dispense Refill  . albuterol (PROAIR HFA) 108 (90 Base) MCG/ACT inhaler Inhale 2 puffs into the lungs every 6 (six) hours as needed. 1 Inhaler 5  . Ascorbic Acid (VITAMIN C) 1000 MG tablet Take 1,000 mg by mouth daily.    Marland Kitchen aspirin EC 81 MG tablet Take 81 mg by mouth daily.    . Azelastine HCl 0.15 % SOLN Place 2 sprays into both nostrils 2 (two) times daily.    . B Complex Vitamins (B-COMPLEX/B-12) TABS Take by mouth daily.    . chlorthalidone (HYGROTON) 25 MG tablet Take 1 tablet (25 mg total) by mouth daily. 90 tablet 1  . Cholecalciferol (VITAMIN D3) LIQD Take 1,000 Units by mouth daily.     Marland Kitchen estradiol (VIVELLE-DOT) 0.05 MG/24HR patch Place 1 patch onto the skin 2 (two) times a week.    . etodolac (LODINE) 400 MG tablet  Take 400 mg by mouth 3 (three) times daily.    Marland Kitchen ezetimibe (ZETIA) 10 MG tablet Take 1 tablet (10 mg total) by mouth daily. 90 tablet 3  . fluticasone (FLONASE) 50 MCG/ACT nasal spray Place 2 sprays into both nostrils daily.    . irbesartan (AVAPRO) 300 MG tablet Take 300 mg by mouth daily.    . Magnesium Oxide (MAG-OXIDE PO) Take 250 mg by mouth daily.    . montelukast (SINGULAIR) 10 MG tablet Take 10 mg by mouth daily.    . Multiple Vitamin (MULTI-VITAMINS) TABS Take by mouth.    . omega-3 acid ethyl esters (LOVAZA) 1 G capsule Take 2 g by mouth daily.    Marland Kitchen omeprazole (PRILOSEC) 20 MG capsule Take 20 mg by mouth daily.    . ondansetron (ZOFRAN-ODT) 4 MG disintegrating tablet Take 4 mg by mouth as needed.     . predniSONE (DELTASONE) 5 MG tablet Take 5 mg by mouth daily with breakfast.    . sotalol (BETAPACE) 80 MG tablet Take 80 mg by mouth 2 (two) times daily.      Marland Kitchen venlafaxine (EFFEXOR-XR) 75 MG 24 hr capsule Take 75 mg by mouth Daily.     . Zinc 30 MG CAPS Take by mouth daily.     No current facility-administered medications on file prior to visit.  Allergies  Allergen Reactions  . Atorvastatin Other (See Comments)    Muscle cramps severe  Other reaction(s): Cramps (ALLERGY/intolerance), Other Muscle cramps severe Muscle cramps severe   . Simvastatin Other (See Comments)    Severe muscle cramps  Other reaction(s): Cramps (ALLERGY/intolerance) Severe muscle cramps Severe muscle cramps   . Cephalosporins     Other reaction(s): Other, Other (See Comments)  . Nickel Rash    Other reaction(s): Other  . Sulfa Antibiotics     hives Other reaction(s): Other hives  . Sulfasalazine Hives    hives  . Eliquis [Apixaban] Hives and Rash  . Xarelto [Rivaroxaban] Hives and Rash    Past Medical History:  Diagnosis Date  . Asthma 1976  . Atrial fibrillation ([place])   . Bronchitis   . FUO (fever of unknown origin) 03/03/2015  . Hepatitis 1980s  . Hepatitis   . HTN  (hypertension)   . Hypercholesterolemia   . Night sweat 03/03/2015  . PMR (polymyalgia rheumatica) ([place]) 03/15/2015  . 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 Polymyalgia ([place]) 03/03/2015  . Polymyositis ([place]) 03/03/2015    Past Surgical History:  Procedure Laterality Date  . APPENDECTOMY  1973  . FOOT SURGERY Left 2009  . KNEE ARTHROPLASTY  2007   right  . OVARY SURGERY  2001  . TUBAL LIGATION  1972  . VAGINAL HYSTERECTOMY  1973    Social History   Tobacco Use  Smoking Status Former Smoker  . Packs/day: 0.20  . Years: 5.00  . Pack years: 1.00  . Types: Cigarettes  . Quit date: 09/23/1980  . Years since quitting: 39.1  Smokeless Tobacco Never Used    Social History   Substance and Sexual Activity  Alcohol Use Yes   Comment: 1 glass 5-6 times weekly    Family History  Problem Relation Age of Onset  . Hypertension Sister   . Lung cancer Father   . Hypertension Mother   . Multiple sclerosis Daughter   . Breast cancer Other        maternal aunt  . Allergies Daughter   . Allergies Daughter   . Allergies Son     Review of Systems: As noted in history of present illness.  All other systems were reviewed and are negative.  Physical Exam: There were no vitals taken for this visit. GENERAL:  Well appearing WF in NAD HEENT:  PERRL, EOMI, sclera are clear. Oropharynx is clear. NECK:  No jugular venous distention, carotid upstroke brisk and symmetric, no bruits, no thyromegaly or adenopathy LUNGS:  Clear to auscultation bilaterally CHEST:  Unremarkable HEART:  RRR,  PMI not displaced or sustained,S1 and S2 within normal limits, no S3, no S4: no clicks, no rubs, no murmurs ABD:  Soft, nontender. BS +, no masses or bruits. No hepatomegaly, no splenomegaly EXT:  2 + pulses throughout, no edema, no cyanosis no clubbing SKIN:  Warm and dry.  No rashes NEURO:  Alert and oriented x 3. Cranial nerves II through XII intact. PSYCH:  Cognitively intact    LBORATORY DATA:  Lab Results   Component Value Date   WBC 9.5 02/27/2015   HGB 10.5 (L) 02/27/2015   HCT 33.0 (L) 02/27/2015   PLT 354 02/27/2015   GLUCOSE 109 (H) 04/21/2019   ALT 38 (H) 04/21/2019   AST 37 04/21/2019   NA 138 04/21/2019   K 3.8 04/21/2019   CL 95 (L) 04/21/2019   CREATININE 0.99 04/21/2019   BUN 19 04/21/2019   CO2 26 04/21/2019  Labs dated 02/06/16: cholesterol 236, triglycerides 132, HDL 69, LDL 141. CMET and TSH normal.  November 27/2017: A1c 5.6%. Dated 02/12/17: cholesterol 251, triglycerides 226, HDL 59, LDL 147.  Dated 07/23/17: A1c 5.5% Dated 09/25/17: normal chemistries and TSH. Dated 03/29/19: cholesterol 275, triglycerides 333, HDL 63, LDL 145. A1c 5.3%.  Dated 08/13/19: A1c 5.4%.    Ecg today: NSR rate 66 bpm. Qtc 473 msec.Normal Ecg.  I have personally reviewed and interpreted this study.  Event monitor 05/12/19: Study Highlights   Normal sinus rhythm  Rare PVCs, trigeminy  One 6 beat run of NSVT  Rare PACs.  6 runs of SVT. longest 18 seconds at rate max 152. symptomatic.      Assessment / Plan: 1. Atrial fibrillation, well controlled on sotalol. She has a [place] Vasc score of 3. Intolerant of Xarelto and possibly Eliquis due to drug rash. Will continue to monitor with Cardi[employer]. Stay on ASA 81 mg daily.   2. Hypertension. Blood pressure is well controlled.   3. PMR on steroids.   4. OSA on CPAP  5. PVCs and short runs of SVT. Intolerant of beta blockers and calcium channel blockers in the past. No sustained arrhythmia. Symptoms better on magnesium. Will monitor. On Sotalol  6. HLD mixed. Now on Zetia. Statin intolerant. Encouraged increased activity and weight loss. Add fish oil for elevated triglycerides.

## 2019-11-23 ENCOUNTER — Ambulatory Visit: Payer: PPO | Admitting: Cardiology

## 2019-11-30 ENCOUNTER — Other Ambulatory Visit: Payer: Self-pay

## 2019-11-30 ENCOUNTER — Ambulatory Visit: Payer: PPO | Admitting: Podiatry

## 2019-11-30 VITALS — Temp 97.2°F

## 2019-11-30 DIAGNOSIS — M7751 Other enthesopathy of right foot: Secondary | ICD-10-CM | POA: Diagnosis not present

## 2019-11-30 NOTE — Progress Notes (Signed)
She presents today for follow-up of her capsulitis subtalar joint sinus tarsi area of the right foot.  States that she is 85 to 95% improved at this point.  Objective: Vital signs are stable alert and oriented x3.  Pulses are palpable.  She has pain on inversion of the subtalar joint end range of motion as well as on palpation of the sinus tarsi of the right foot.  No edema erythema cellulitis drainage or odor.  Assessment: Well-healing subtalar joint capsulitis right foot.  Plan: Reinjected today with 20 mg Kenalog 5 mg Marcaine point of maximal tenderness extubated skin prep sinus tarsi of the right foot.  Follow-up with her in 1 month if needed.

## 2019-12-14 ENCOUNTER — Encounter: Payer: Self-pay | Admitting: Cardiology

## 2019-12-27 DIAGNOSIS — H353131 Nonexudative age-related macular degeneration, bilateral, early dry stage: Secondary | ICD-10-CM | POA: Diagnosis not present

## 2019-12-27 DIAGNOSIS — H5213 Myopia, bilateral: Secondary | ICD-10-CM | POA: Diagnosis not present

## 2019-12-27 DIAGNOSIS — Z961 Presence of intraocular lens: Secondary | ICD-10-CM | POA: Diagnosis not present

## 2019-12-27 DIAGNOSIS — H52203 Unspecified astigmatism, bilateral: Secondary | ICD-10-CM | POA: Diagnosis not present

## 2020-01-03 DIAGNOSIS — Z1331 Encounter for screening for depression: Secondary | ICD-10-CM | POA: Diagnosis not present

## 2020-01-03 DIAGNOSIS — K089 Disorder of teeth and supporting structures, unspecified: Secondary | ICD-10-CM | POA: Diagnosis not present

## 2020-01-03 DIAGNOSIS — M353 Polymyalgia rheumatica: Secondary | ICD-10-CM | POA: Diagnosis not present

## 2020-01-03 DIAGNOSIS — G4733 Obstructive sleep apnea (adult) (pediatric): Secondary | ICD-10-CM | POA: Diagnosis not present

## 2020-01-03 DIAGNOSIS — I10 Essential (primary) hypertension: Secondary | ICD-10-CM | POA: Diagnosis not present

## 2020-01-03 DIAGNOSIS — R42 Dizziness and giddiness: Secondary | ICD-10-CM | POA: Diagnosis not present

## 2020-01-03 DIAGNOSIS — E119 Type 2 diabetes mellitus without complications: Secondary | ICD-10-CM | POA: Diagnosis not present

## 2020-01-03 DIAGNOSIS — I480 Paroxysmal atrial fibrillation: Secondary | ICD-10-CM | POA: Diagnosis not present

## 2020-01-06 ENCOUNTER — Ambulatory Visit: Payer: PPO | Admitting: Podiatry

## 2020-01-06 ENCOUNTER — Ambulatory Visit: Payer: PPO | Admitting: Cardiology

## 2020-01-20 NOTE — Progress Notes (Signed)
[name] [name] Date of Birth: [birth year]   History of Present Illness: [name] [name] is seen for  followup. She has a history of atrial fibrillation that has been well controlled with sotalol since 2003. Prior use of Toprol caused her to be very fatigued and calcium channel blockers did not help.   She previously developed a bad pruritic rash all over. Skin biopsy was c/w a drug reaction. She was placed on steroids and Xarelto was stopped in favor of Eliquis. Rash did not clear on Eliquis and this was also stopped. Rash finally resolved. Patient given option of Pradaxa but she refused. Now only taking ASA 81 mg daily. She denies any Afib episodes. Steroids now 5 mg daily for PMR. She has Cardiomobile which she carries with her now. She has been diagnosed with OSA and is now on CPAP.   She was seen on April 21, 2019 for complaints of palpitations. This occurs more at night when lying on her left side. now occurring some during the day. Noted more PVCs on her Apple phone but no AFib. A Zio monitor was placed which showed no Afib. PVCs with one brief run of NSVT and several brief runs of SVT. She started taking a magnesium supplement and this resolved her palpitations.  She does go to the gym regularly. Still has a hard time losing weight. No palpitations at all. No chest pain or dyspnea.  Current Outpatient Medications on File Prior to Visit  Medication Sig Dispense Refill  . albuterol (PROAIR HFA) 108 (90 Base) MCG/ACT inhaler Inhale 2 puffs into the lungs every 6 (six) hours as needed. 1 Inhaler 5  . Ascorbic Acid (VITAMIN C) 1000 MG tablet Take 1,000 mg by mouth daily.    Marland Kitchen aspirin EC 81 MG tablet Take 81 mg by mouth daily.    . Azelastine HCl 0.15 % SOLN Place 2 sprays into both nostrils 2 (two) times daily.    . B Complex Vitamins (B-COMPLEX/B-12) TABS Take by mouth daily.    . chlorthalidone (HYGROTON) 25 MG tablet Take 1 tablet (25 mg total) by mouth daily. 90 tablet 1  . Cholecalciferol  (VITAMIN D3) LIQD Take 1,000 Units by mouth daily.     Marland Kitchen estradiol (VIVELLE-DOT) 0.05 MG/24HR patch Place 1 patch onto the skin 2 (two) times a week.    . ezetimibe (ZETIA) 10 MG tablet Take 1 tablet (10 mg total) by mouth daily. 90 tablet 3  . fluticasone (FLONASE) 50 MCG/ACT nasal spray Place 2 sprays into both nostrils daily.    . irbesartan (AVAPRO) 300 MG tablet Take 300 mg by mouth daily.    . Magnesium Oxide (MAG-OXIDE PO) Take 250 mg by mouth daily.    . meclizine (ANTIVERT) 25 MG tablet Take 25 mg by mouth 3 (three) times daily as needed.    . montelukast (SINGULAIR) 10 MG tablet Take 10 mg by mouth daily.    . Multiple Vitamin (MULTI-VITAMINS) TABS Take by mouth.    . omega-3 acid ethyl esters (LOVAZA) 1 G capsule Take 2 g by mouth daily.    Marland Kitchen omeprazole (PRILOSEC) 20 MG capsule Take 20 mg by mouth daily.    . ondansetron (ZOFRAN-ODT) 4 MG disintegrating tablet Take 4 mg by mouth as needed.     . predniSONE (DELTASONE) 5 MG tablet Take 5 mg by mouth daily with breakfast.    . sotalol (BETAPACE) 80 MG tablet Take 80 mg by mouth 2 (two) times daily.      Marland Kitchen  venlafaxine (EFFEXOR-XR) 75 MG 24 hr capsule Take 75 mg by mouth Daily.     . Zinc 30 MG CAPS Take by mouth daily.     No current facility-administered medications on file prior to visit.    Allergies  Allergen Reactions  . Atorvastatin Other (See Comments)    Muscle cramps severe  Other reaction(s): Cramps (ALLERGY/intolerance), Other Muscle cramps severe Muscle cramps severe   . Simvastatin Other (See Comments)    Severe muscle cramps  Other reaction(s): Cramps (ALLERGY/intolerance) Severe muscle cramps Severe muscle cramps   . Cephalosporins     Other reaction(s): Other, Other (See Comments)  . Nickel Rash    Other reaction(s): Other  . Sulfa Antibiotics     hives Other reaction(s): Other hives  . Sulfasalazine Hives    hives  . Eliquis [Apixaban] Hives and Rash  . Xarelto [Rivaroxaban] Hives and Rash     Past Medical History:  Diagnosis Date  . Asthma 1976  . Atrial fibrillation ([place])   . Bronchitis   . FUO (fever of unknown origin) 03/03/2015  . Hepatitis 1980s  . Hepatitis   . HTN (hypertension)   . Hypercholesterolemia   . Night sweat 03/03/2015  . PMR (polymyalgia rheumatica) ([place]) 03/15/2015  . 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 Polymyalgia ([place]) 03/03/2015  . Polymyositis ([place]) 03/03/2015    Past Surgical History:  Procedure Laterality Date  . APPENDECTOMY  1973  . FOOT SURGERY Left 2009  . KNEE ARTHROPLASTY  2007   right  . OVARY SURGERY  2001  . TUBAL LIGATION  1972  . VAGINAL HYSTERECTOMY  1973    Social History   Tobacco Use  Smoking Status Former Smoker  . Packs/day: 0.20  . Years: 5.00  . Pack years: 1.00  . Types: Cigarettes  . Quit date: 09/23/1980  . Years since quitting: 39.3  Smokeless Tobacco Never Used    Social History   Substance and Sexual Activity  Alcohol Use Yes   Comment: 1 glass 5-6 times weekly    Family History  Problem Relation Age of Onset  . Hypertension Sister   . Lung cancer Father   . Hypertension Mother   . Multiple sclerosis Daughter   . Breast cancer Other        maternal aunt  . Allergies Daughter   . Allergies Daughter   . Allergies Son     Review of Systems: As noted in history of present illness.  All other systems were reviewed and are negative.  Physical Exam: BP 132/62   Pulse 83   Ht 5\' 3"  (1.6 m)   Wt 213 lb (96.6 kg)   SpO2 98%   BMI 37.73 kg/m  GENERAL:  Well appearing, obese WF in NAD HEENT:  PERRL, EOMI, sclera are clear. Oropharynx is clear. NECK:  No jugular venous distention, carotid upstroke brisk and symmetric, no bruits, no thyromegaly or adenopathy LUNGS:  Clear to auscultation bilaterally CHEST:  Unremarkable HEART:  RRR,  PMI not displaced or sustained,S1 and S2 within normal limits, no S3, no S4: no clicks, no rubs, no murmurs ABD:  Soft, nontender. BS +, no masses or bruits. No  hepatomegaly, no splenomegaly EXT:  2 + pulses throughout, no edema, no cyanosis no clubbing SKIN:  Warm and dry.  No rashes NEURO:  Alert and oriented x 3. Cranial nerves II through XII intact. PSYCH:  Cognitively intact    LBORATORY DATA:  Lab Results  Component Value Date   WBC 9.5 02/27/2015  HGB 10.5 (L) 02/27/2015   HCT 33.0 (L) 02/27/2015   PLT 354 02/27/2015   GLUCOSE 109 (H) 04/21/2019   ALT 38 (H) 04/21/2019   AST 37 04/21/2019   NA 138 04/21/2019   K 3.8 04/21/2019   CL 95 (L) 04/21/2019   CREATININE 0.99 04/21/2019   BUN 19 04/21/2019   CO2 26 04/21/2019   Labs dated 02/06/16: cholesterol 236, triglycerides 132, HDL 69, LDL 141. CMET and TSH normal.  November 27/2017: A1c 5.6%. Dated 02/12/17: cholesterol 251, triglycerides 226, HDL 59, LDL 147.  Dated 07/23/17: A1c 5.5% Dated 09/25/17: normal chemistries and TSH. Dated 03/29/19: cholesterol 275, triglycerides 333, HDL 63, LDL 145. LFTs normal.  Dated 11/18/19: Normal BMET. Dated 01/03/20: A1c 5.5%   Event monitor: Study Highlights 05/12/19   Normal sinus rhythm  Rare PVCs, trigeminy  One 6 beat run of NSVT  Rare PACs.  6 runs of SVT. longest 18 seconds at rate max 152. symptomatic.    Assessment / Plan: 1. Atrial fibrillation, well controlled on sotalol. No episodes in several years. She has a [place] Vasc score of 3. Intolerant of Xarelto and possibly Eliquis due to drug rash. Will continue to monitor with Cardi[employer]. Stay on ASA 81 mg daily.   2. Hypertension. Blood pressure is well controlled.   3. PMR on steroids.   4. OSA on CPAP  5. PVCs and short runs of SVT. Intolerant of beta blockers and calcium channel blockers in the past.  Symptoms better on magnesium. Continue Sotalol

## 2020-01-24 ENCOUNTER — Ambulatory Visit: Payer: PPO | Admitting: Cardiology

## 2020-01-24 ENCOUNTER — Other Ambulatory Visit: Payer: Self-pay

## 2020-01-24 ENCOUNTER — Encounter: Payer: Self-pay | Admitting: Cardiology

## 2020-01-24 VITALS — BP 132/62 | HR 83 | Ht 63.0 in | Wt 213.0 lb

## 2020-01-24 DIAGNOSIS — I10 Essential (primary) hypertension: Secondary | ICD-10-CM

## 2020-01-24 DIAGNOSIS — I471 Supraventricular tachycardia: Secondary | ICD-10-CM | POA: Diagnosis not present

## 2020-01-24 DIAGNOSIS — I493 Ventricular premature depolarization: Secondary | ICD-10-CM

## 2020-01-24 DIAGNOSIS — I480 Paroxysmal atrial fibrillation: Secondary | ICD-10-CM

## 2020-02-01 DIAGNOSIS — M353 Polymyalgia rheumatica: Secondary | ICD-10-CM | POA: Diagnosis not present

## 2020-02-01 DIAGNOSIS — Z7952 Long term (current) use of systemic steroids: Secondary | ICD-10-CM | POA: Diagnosis not present

## 2020-02-01 DIAGNOSIS — Z6836 Body mass index (BMI) 36.0-36.9, adult: Secondary | ICD-10-CM | POA: Diagnosis not present

## 2020-02-01 DIAGNOSIS — E669 Obesity, unspecified: Secondary | ICD-10-CM | POA: Diagnosis not present

## 2020-03-13 DIAGNOSIS — M545 Low back pain: Secondary | ICD-10-CM | POA: Diagnosis not present

## 2020-03-13 DIAGNOSIS — I10 Essential (primary) hypertension: Secondary | ICD-10-CM | POA: Diagnosis not present

## 2020-03-15 ENCOUNTER — Other Ambulatory Visit: Payer: Self-pay | Admitting: Internal Medicine

## 2020-03-15 ENCOUNTER — Other Ambulatory Visit (HOSPITAL_COMMUNITY): Payer: Self-pay | Admitting: Internal Medicine

## 2020-03-15 DIAGNOSIS — M47816 Spondylosis without myelopathy or radiculopathy, lumbar region: Secondary | ICD-10-CM

## 2020-03-15 DIAGNOSIS — M545 Low back pain, unspecified: Secondary | ICD-10-CM

## 2020-03-25 ENCOUNTER — Ambulatory Visit (HOSPITAL_COMMUNITY): Payer: PPO

## 2020-03-28 ENCOUNTER — Encounter (HOSPITAL_COMMUNITY): Payer: Self-pay

## 2020-03-28 ENCOUNTER — Ambulatory Visit (HOSPITAL_COMMUNITY): Payer: PPO

## 2020-03-28 ENCOUNTER — Ambulatory Visit: Admit: 2020-03-28 | Payer: PPO

## 2020-03-28 SURGERY — MRI WITH ANESTHESIA
Anesthesia: General

## 2020-04-11 DIAGNOSIS — G4733 Obstructive sleep apnea (adult) (pediatric): Secondary | ICD-10-CM | POA: Diagnosis not present

## 2020-04-15 ENCOUNTER — Ambulatory Visit
Admission: RE | Admit: 2020-04-15 | Discharge: 2020-04-15 | Disposition: A | Discharge status: Home / Self Care | Payer: PPO | Source: Ambulatory Visit | Attending: Internal Medicine | Admitting: Internal Medicine

## 2020-04-15 DIAGNOSIS — M47816 Spondylosis without myelopathy or radiculopathy, lumbar region: Secondary | ICD-10-CM

## 2020-04-15 DIAGNOSIS — M545 Low back pain, unspecified: Secondary | ICD-10-CM

## 2020-04-15 DIAGNOSIS — M48061 Spinal stenosis, lumbar region without neurogenic claudication: Secondary | ICD-10-CM | POA: Diagnosis not present

## 2020-04-21 ENCOUNTER — Other Ambulatory Visit: Payer: PPO

## 2020-04-21 DIAGNOSIS — E7849 Other hyperlipidemia: Secondary | ICD-10-CM | POA: Diagnosis not present

## 2020-04-21 DIAGNOSIS — M859 Disorder of bone density and structure, unspecified: Secondary | ICD-10-CM | POA: Diagnosis not present

## 2020-04-21 DIAGNOSIS — E119 Type 2 diabetes mellitus without complications: Secondary | ICD-10-CM | POA: Diagnosis not present

## 2020-04-28 DIAGNOSIS — Z0000 Encounter for general adult medical examination without abnormal findings: Secondary | ICD-10-CM | POA: Diagnosis not present

## 2020-04-28 DIAGNOSIS — I480 Paroxysmal atrial fibrillation: Secondary | ICD-10-CM | POA: Diagnosis not present

## 2020-04-28 DIAGNOSIS — M47816 Spondylosis without myelopathy or radiculopathy, lumbar region: Secondary | ICD-10-CM | POA: Diagnosis not present

## 2020-04-28 DIAGNOSIS — G4733 Obstructive sleep apnea (adult) (pediatric): Secondary | ICD-10-CM | POA: Diagnosis not present

## 2020-04-28 DIAGNOSIS — M353 Polymyalgia rheumatica: Secondary | ICD-10-CM | POA: Diagnosis not present

## 2020-04-28 DIAGNOSIS — E785 Hyperlipidemia, unspecified: Secondary | ICD-10-CM | POA: Diagnosis not present

## 2020-04-28 DIAGNOSIS — M8580 Other specified disorders of bone density and structure, unspecified site: Secondary | ICD-10-CM | POA: Diagnosis not present

## 2020-04-28 DIAGNOSIS — E876 Hypokalemia: Secondary | ICD-10-CM | POA: Diagnosis not present

## 2020-04-28 DIAGNOSIS — K635 Polyp of colon: Secondary | ICD-10-CM | POA: Diagnosis not present

## 2020-04-28 DIAGNOSIS — E119 Type 2 diabetes mellitus without complications: Secondary | ICD-10-CM | POA: Diagnosis not present

## 2020-04-28 DIAGNOSIS — I10 Essential (primary) hypertension: Secondary | ICD-10-CM | POA: Diagnosis not present

## 2020-05-03 DIAGNOSIS — M545 Low back pain: Secondary | ICD-10-CM | POA: Diagnosis not present

## 2020-05-03 DIAGNOSIS — G8929 Other chronic pain: Secondary | ICD-10-CM | POA: Diagnosis not present

## 2020-06-08 DIAGNOSIS — E119 Type 2 diabetes mellitus without complications: Secondary | ICD-10-CM | POA: Diagnosis not present

## 2020-06-08 DIAGNOSIS — I10 Essential (primary) hypertension: Secondary | ICD-10-CM | POA: Diagnosis not present

## 2020-07-11 DIAGNOSIS — Z1231 Encounter for screening mammogram for malignant neoplasm of breast: Secondary | ICD-10-CM | POA: Diagnosis not present

## 2020-07-18 DIAGNOSIS — E119 Type 2 diabetes mellitus without complications: Secondary | ICD-10-CM

## 2020-07-18 DIAGNOSIS — Z01419 Encounter for gynecological examination (general) (routine) without abnormal findings: Secondary | ICD-10-CM | POA: Diagnosis not present

## 2020-07-18 DIAGNOSIS — N958 Other specified menopausal and perimenopausal disorders: Secondary | ICD-10-CM | POA: Diagnosis not present

## 2020-07-18 DIAGNOSIS — Z6836 Body mass index (BMI) 36.0-36.9, adult: Secondary | ICD-10-CM | POA: Diagnosis not present

## 2020-07-18 HISTORY — DX: Type 2 diabetes mellitus without complications: E11.9

## 2020-08-03 DIAGNOSIS — E669 Obesity, unspecified: Secondary | ICD-10-CM | POA: Diagnosis not present

## 2020-08-03 DIAGNOSIS — M353 Polymyalgia rheumatica: Secondary | ICD-10-CM | POA: Diagnosis not present

## 2020-08-03 DIAGNOSIS — Z7952 Long term (current) use of systemic steroids: Secondary | ICD-10-CM | POA: Diagnosis not present

## 2020-08-03 DIAGNOSIS — Z6835 Body mass index (BMI) 35.0-35.9, adult: Secondary | ICD-10-CM | POA: Diagnosis not present

## 2020-09-06 DIAGNOSIS — E119 Type 2 diabetes mellitus without complications: Secondary | ICD-10-CM | POA: Diagnosis not present

## 2020-09-06 DIAGNOSIS — G4733 Obstructive sleep apnea (adult) (pediatric): Secondary | ICD-10-CM | POA: Diagnosis not present

## 2020-09-06 DIAGNOSIS — I480 Paroxysmal atrial fibrillation: Secondary | ICD-10-CM | POA: Diagnosis not present

## 2020-09-06 DIAGNOSIS — M353 Polymyalgia rheumatica: Secondary | ICD-10-CM | POA: Diagnosis not present

## 2020-09-06 DIAGNOSIS — I10 Essential (primary) hypertension: Secondary | ICD-10-CM | POA: Diagnosis not present

## 2020-09-06 DIAGNOSIS — E785 Hyperlipidemia, unspecified: Secondary | ICD-10-CM | POA: Diagnosis not present

## 2020-09-10 ENCOUNTER — Other Ambulatory Visit: Payer: Self-pay | Admitting: Cardiology

## 2020-10-12 DIAGNOSIS — I10 Essential (primary) hypertension: Secondary | ICD-10-CM | POA: Diagnosis not present

## 2020-10-12 DIAGNOSIS — M353 Polymyalgia rheumatica: Secondary | ICD-10-CM | POA: Diagnosis not present

## 2020-10-12 DIAGNOSIS — E7800 Pure hypercholesterolemia, unspecified: Secondary | ICD-10-CM | POA: Diagnosis not present

## 2020-10-12 DIAGNOSIS — E119 Type 2 diabetes mellitus without complications: Secondary | ICD-10-CM | POA: Diagnosis not present

## 2020-10-13 DIAGNOSIS — G4733 Obstructive sleep apnea (adult) (pediatric): Secondary | ICD-10-CM | POA: Diagnosis not present

## 2020-10-25 ENCOUNTER — Ambulatory Visit: Payer: PPO | Admitting: Pulmonary Disease

## 2020-10-26 ENCOUNTER — Ambulatory Visit: Payer: PPO | Admitting: Pulmonary Disease

## 2020-11-06 DIAGNOSIS — M79601 Pain in right arm: Secondary | ICD-10-CM | POA: Diagnosis not present

## 2020-11-06 DIAGNOSIS — S46211A Strain of muscle, fascia and tendon of other parts of biceps, right arm, initial encounter: Secondary | ICD-10-CM | POA: Diagnosis not present

## 2020-11-14 DIAGNOSIS — H8110 Benign paroxysmal vertigo, unspecified ear: Secondary | ICD-10-CM | POA: Diagnosis not present

## 2020-11-14 DIAGNOSIS — H6121 Impacted cerumen, right ear: Secondary | ICD-10-CM | POA: Diagnosis not present

## 2020-11-22 ENCOUNTER — Encounter: Payer: Self-pay | Admitting: Pulmonary Disease

## 2020-11-22 ENCOUNTER — Other Ambulatory Visit: Payer: Self-pay

## 2020-11-22 ENCOUNTER — Ambulatory Visit: Payer: PPO | Admitting: Physician Assistant

## 2020-11-22 ENCOUNTER — Ambulatory Visit: Payer: PPO | Admitting: Pulmonary Disease

## 2020-11-22 ENCOUNTER — Encounter: Payer: Self-pay | Admitting: Physician Assistant

## 2020-11-22 DIAGNOSIS — L820 Inflamed seborrheic keratosis: Secondary | ICD-10-CM

## 2020-11-22 DIAGNOSIS — T148XXA Other injury of unspecified body region, initial encounter: Secondary | ICD-10-CM

## 2020-11-22 DIAGNOSIS — G4733 Obstructive sleep apnea (adult) (pediatric): Secondary | ICD-10-CM | POA: Diagnosis not present

## 2020-11-22 DIAGNOSIS — S40811A Abrasion of right upper arm, initial encounter: Secondary | ICD-10-CM

## 2020-11-22 DIAGNOSIS — J4520 Mild intermittent asthma, uncomplicated: Secondary | ICD-10-CM | POA: Diagnosis not present

## 2020-11-22 DIAGNOSIS — D485 Neoplasm of uncertain behavior of skin: Secondary | ICD-10-CM

## 2020-11-22 MED ORDER — MUPIROCIN 2 % EX OINT: 1.0000 "application " | TOPICAL_OINTMENT | Freq: Every day | CUTANEOUS | 2 refills | Status: DC

## 2020-11-22 NOTE — Progress Notes (Signed)
   Subjective:    Patient ID: [name] [name], female    DOB: 1946/09/07, 75 y.o.   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 Polymyalgia rheumatica was diagnosed in 2016,good response to steroids.    Annual follow-up. Breathing is doing okay, no wheezing or nocturnal awakenings. CPAP works well when she uses it she wakes up feeling rested however this is a nuisance, she would like to discuss alternatives. She has lost from 206 to 190 pounds, started on Ozempic due to new diagnosis of diabetes.  Overall she continues to have significant PMR and remains on 5 mg of prednisone and this has caused weight gain over the past few years  No problems with mask or pressure  Significant tests/ events reviewed HST 05/2017 AHI 32/h  Review of Systems neg for any significant sore throat, dysphagia, itching, sneezing, nasal congestion or excess/ purulent secretions, fever, chills, sweats, unintended wt loss, pleuritic or exertional cp, hempoptysis, orthopnea pnd or change in chronic leg swelling. Also denies presyncope, palpitations, heartburn, abdominal pain, nausea, vomiting, diarrhea or change in bowel or urinary habits, dysuria,hematuria, rash, arthralgias, visual complaints, headache, numbness weakness or ataxia.     Objective:   Physical Exam  Gen. Pleasant, well-nourished, in no distress ENT - no thrush, no pallor/icterus,no post nasal drip Neck: No JVD, no thyromegaly, no carotid bruits Lungs: no use of accessory muscles, no dullness to percussion, clear without rales or rhonchi  Cardiovascular: Rhythm regular, heart sounds  normal, no murmurs or gallops, no peripheral edema Musculoskeletal: No deformities, no cyanosis or clubbing        Assessment & Plan:

## 2020-11-22 NOTE — Assessment & Plan Note (Signed)
We discussed alternatives to CPAP including -Dental appliance -please discuss with your dentist or we can refer to Dr. Ron Parker or Dr. Toy Cookey, she has a slight overbite so may be a good candidate, will need repeat HST prior to reassess degree of OSA -Inspire -upper airway stimulation device -her BMI is right at 32 so she would qualify  Meanwhile I reassured her that CPAP is working well on current settings Weight loss encouraged, compliance with goal of at least 4-6 hrs every night is the expectation. Advised against medications with sedative side effects Cautioned against driving when sleepy - understanding that sleepiness will vary on a day to day basis

## 2020-11-22 NOTE — Patient Instructions (Signed)
  Congratulations on weight loss! We discussed alternatives to CPAP including -Dental appliance -please discuss with your dentist or we can refer to Dr. Ron Parker or Dr. Toy Cookey -Inspire -upper airway stimulation device

## 2020-11-22 NOTE — Assessment & Plan Note (Signed)
Well-controlled. Continue albuterol on as-needed basis

## 2020-11-22 NOTE — Patient Instructions (Signed)

## 2020-12-11 ENCOUNTER — Encounter: Payer: Self-pay | Admitting: Physician Assistant

## 2020-12-11 NOTE — Progress Notes (Signed)
   Follow-Up Visit   Subjective  [name] [name] is a 75 y.o. female who presents for the following: Skin Problem (Wants spot removed from right side of neck - drives me crazy).   The following portions of the chart were reviewed this encounter and updated as appropriate:  Tobacco  Allergies  Meds  Problems  Med Hx  Surg Hx  Fam Hx      Objective  Well appearing patient in no apparent distress; mood and affect are within normal limits.  A focused examination was performed including extremities, including the arms, hands, fingers, and fingernails and the legs, feet, toes, and toenails and face, neck, chest and back. Relevant physical exam findings are noted in the Assessment and Plan.  Objective  Right Forearm - Posterior: Large area where skin was avulsed  Objective  Right Anterior Neck: Thick crust        Assessment & Plan  Abrasion Right Forearm - Posterior  mupirocin ointment (BACTROBAN) 2 % - Right Forearm - Posterior  Neoplasm of uncertain behavior of skin Right Anterior Neck  Skin / nail biopsy Type of biopsy: tangential   Informed consent: discussed and consent obtained   Timeout: patient name, date of birth, surgical site, and procedure verified   Procedure prep:  Patient was prepped and draped in usual sterile fashion (Non sterile) Prep type:  Chlorhexidine Anesthesia: the lesion was anesthetized in a standard fashion   Anesthetic:  1% lidocaine w/ epinephrine 1-100,000 local infiltration Instrument used: flexible razor blade   Hemostasis achieved with: aluminum chloride   Outcome: patient tolerated procedure well   Post-procedure details: sterile dressing applied and wound care instructions given   Dressing type: bandage and petrolatum    Specimen 1 - Surgical pathology Differential Diagnosis: R/O ISK  Check Margins: No    I, Greyson Riccardi, PA-C, have reviewed all documentation's for this visit.  The documentation on 12/11/20 for the  exam, diagnosis, procedures and orders are all accurate and complete.

## 2021-01-02 DIAGNOSIS — R110 Nausea: Secondary | ICD-10-CM | POA: Diagnosis not present

## 2021-01-02 DIAGNOSIS — E669 Obesity, unspecified: Secondary | ICD-10-CM | POA: Diagnosis not present

## 2021-01-02 DIAGNOSIS — H5213 Myopia, bilateral: Secondary | ICD-10-CM | POA: Diagnosis not present

## 2021-01-02 DIAGNOSIS — E119 Type 2 diabetes mellitus without complications: Secondary | ICD-10-CM | POA: Diagnosis not present

## 2021-01-02 DIAGNOSIS — K219 Gastro-esophageal reflux disease without esophagitis: Secondary | ICD-10-CM | POA: Diagnosis not present

## 2021-01-02 DIAGNOSIS — K5900 Constipation, unspecified: Secondary | ICD-10-CM | POA: Diagnosis not present

## 2021-01-02 DIAGNOSIS — R194 Change in bowel habit: Secondary | ICD-10-CM | POA: Diagnosis not present

## 2021-01-02 DIAGNOSIS — Z961 Presence of intraocular lens: Secondary | ICD-10-CM | POA: Diagnosis not present

## 2021-01-03 DIAGNOSIS — Z23 Encounter for immunization: Secondary | ICD-10-CM | POA: Diagnosis not present

## 2021-01-03 DIAGNOSIS — I10 Essential (primary) hypertension: Secondary | ICD-10-CM | POA: Diagnosis not present

## 2021-01-03 DIAGNOSIS — M353 Polymyalgia rheumatica: Secondary | ICD-10-CM | POA: Diagnosis not present

## 2021-01-03 DIAGNOSIS — E785 Hyperlipidemia, unspecified: Secondary | ICD-10-CM | POA: Diagnosis not present

## 2021-01-03 DIAGNOSIS — G4733 Obstructive sleep apnea (adult) (pediatric): Secondary | ICD-10-CM | POA: Diagnosis not present

## 2021-01-03 DIAGNOSIS — I480 Paroxysmal atrial fibrillation: Secondary | ICD-10-CM | POA: Diagnosis not present

## 2021-01-03 DIAGNOSIS — E119 Type 2 diabetes mellitus without complications: Secondary | ICD-10-CM | POA: Diagnosis not present

## 2021-01-17 DIAGNOSIS — E7800 Pure hypercholesterolemia, unspecified: Secondary | ICD-10-CM | POA: Diagnosis not present

## 2021-01-17 DIAGNOSIS — I10 Essential (primary) hypertension: Secondary | ICD-10-CM | POA: Diagnosis not present

## 2021-01-17 DIAGNOSIS — M353 Polymyalgia rheumatica: Secondary | ICD-10-CM | POA: Diagnosis not present

## 2021-01-17 DIAGNOSIS — E119 Type 2 diabetes mellitus without complications: Secondary | ICD-10-CM | POA: Diagnosis not present

## 2021-01-18 DIAGNOSIS — M19039 Primary osteoarthritis, unspecified wrist: Secondary | ICD-10-CM | POA: Insufficient documentation

## 2021-01-18 DIAGNOSIS — M1811 Unilateral primary osteoarthritis of first carpometacarpal joint, right hand: Secondary | ICD-10-CM | POA: Diagnosis not present

## 2021-01-18 DIAGNOSIS — M19031 Primary osteoarthritis, right wrist: Secondary | ICD-10-CM | POA: Diagnosis not present

## 2021-01-22 NOTE — Progress Notes (Signed)
[name] [name] Date of Birth: [birth year]   History of Present Illness: Hero is seen for  followup. She has a history of atrial fibrillation that has been well controlled with sotalol since 2003. Prior use of Toprol caused her to be very fatigued and calcium channel blockers did not help.   She previously developed a bad pruritic rash all over. Skin biopsy was c/w a drug reaction. She was placed on steroids and Xarelto was stopped in favor of Eliquis. Rash did not clear on Eliquis and this was also stopped. Rash finally resolved. Patient given option of Pradaxa but she refused. Now only taking ASA 81 mg daily. She denies any Afib episodes. Steroids now 5 mg daily for PMR. She has Cardiomobile which she carries with her now. She has a history of OSA and is  on CPAP.   She was seen on April 21, 2019 for complaints of palpitations.  Noted more PVCs on her Apple phone but no AFib. A Zio monitor was placed which showed no Afib. PVCs with one brief run of NSVT and several brief runs of SVT. She started taking a magnesium supplement and this resolved her palpitations.  She reports she was diagnosed with type 2 DM. Started on Ozempic. She has lost some weight. Reports no episodes of AFib and PVCs are much less. Not as active now.   Current Outpatient Medications on File Prior to Visit  Medication Sig Dispense Refill  . albuterol (PROAIR HFA) 108 (90 Base) MCG/ACT inhaler Inhale 2 puffs into the lungs every 6 (six) hours as needed. (Patient taking differently: Inhale 2 puffs into the lungs every 6 (six) hours as needed for wheezing or shortness of breath.) 1 Inhaler 5  . amoxicillin (AMOXIL) 500 MG capsule amoxicillin 500 mg capsule  TAKE 4 CAPSULES BY MOUTH 1 HOUR PRIOR TO DENTAL APPOINTMENT    . Ascorbic Acid (VITAMIN C) 1000 MG tablet Take 1,000 mg by mouth daily.    Marland Kitchen aspirin EC 81 MG tablet Take 81 mg by mouth daily.    . B Complex Vitamins (B-COMPLEX/B-12) TABS Take 1 tablet by mouth daily.      . chlorthalidone (HYGROTON) 25 MG tablet Take 1 tablet (25 mg total) by mouth daily. 90 tablet 1  . Cholecalciferol (VITAMIN D3) 125 MCG (5000 UT) CAPS Take 5,000 Units by mouth daily.    Marland Kitchen estradiol (VIVELLE-DOT) 0.05 MG/24HR patch Place 1 patch onto the skin 2 (two) times a week.    . ezetimibe (ZETIA) 10 MG tablet TAKE 1 TABLET BY MOUTH EVERY DAY 90 tablet 3  . fluticasone (FLONASE) 50 MCG/ACT nasal spray Place 2 sprays into both nostrils daily.    . irbesartan (AVAPRO) 300 MG tablet Take 300 mg by mouth daily.    . Magnesium Oxide (MAG-OXIDE PO) Take 250 mg by mouth daily.    . meclizine (ANTIVERT) 25 MG tablet Take 25 mg by mouth 3 (three) times daily as needed for dizziness.     . melatonin 5 MG TABS Take 5 mg by mouth at bedtime.    . montelukast (SINGULAIR) 10 MG tablet Take 10 mg by mouth daily.    . Multiple Vitamin (MULTI-VITAMINS) TABS Take 1 tablet by mouth daily.     . mupirocin ointment (BACTROBAN) 2 % Apply 1 application topically daily. 22 g 2  . omega-3 acid ethyl esters (LOVAZA) 1 G capsule Take 2 g by mouth daily.    Marland Kitchen omeprazole (PRILOSEC) 20 MG capsule Take 20 mg  by mouth daily.    . ondansetron (ZOFRAN-ODT) 4 MG disintegrating tablet Take 4 mg by mouth every 8 (eight) hours as needed for nausea or vomiting.     . predniSONE (DELTASONE) 5 MG tablet Take 5 mg by mouth daily with breakfast.    . Semaglutide,0.25 or 0.5MG /DOS, (OZEMPIC, 0.25 OR 0.5 MG/DOSE,) 2 MG/1.5ML SOPN Inject into the skin.    Marland Kitchen sotalol (BETAPACE) 80 MG tablet Take 80 mg by mouth 2 (two) times daily.    Marland Kitchen venlafaxine (EFFEXOR-XR) 75 MG 24 hr capsule Take 75 mg by mouth Daily.     Marland Kitchen venlafaxine XR (EFFEXOR-XR) 150 MG 24 hr capsule venlafaxine ER 150 mg capsule,extended release 24 hr  TAKE 1 CAPSULE BY MOUTH EVERY DAY    . Zinc 30 MG CAPS Take 30 mg by mouth daily.      No current facility-administered medications on file prior to visit.    Allergies  Allergen Reactions  . Atorvastatin Other (See  Comments)    Muscle cramps severe  Other reaction(s): Cramps (ALLERGY/intolerance), Other Muscle cramps severe Muscle cramps severe   . Simvastatin Other (See Comments)    Severe muscle cramps  Other reaction(s): Cramps (ALLERGY/intolerance) Severe muscle cramps Severe muscle cramps   . Cephalosporins     Other reaction(s): Other, Other (See Comments)  . Nickel Rash    Other reaction(s): Other  . Sulfa Antibiotics     hives Other reaction(s): Other hives  . Sulfasalazine Hives    hives  . Eliquis [Apixaban] Hives and Rash  . Xarelto [Rivaroxaban] Hives and Rash    Past Medical History:  Diagnosis Date  . Asthma 1976  . Atrial fibrillation ([place])   . Bronchitis   . FUO (fever of unknown origin) 03/03/2015  . Hepatitis 1980s  . Hepatitis   . HTN (hypertension)   . Hypercholesterolemia   . Night sweat 03/03/2015  . PMR (polymyalgia rheumatica) ([place]) 03/15/2015  . 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 Polymyalgia ([place]) 03/03/2015  . Polymyositis ([place]) 03/03/2015  . Type 2 diabetes mellitus ([place])     Past Surgical History:  Procedure Laterality Date  . APPENDECTOMY  1973  . FOOT SURGERY Left 2009  . KNEE ARTHROPLASTY  2007   right  . OVARY SURGERY  2001  . TUBAL LIGATION  1972  . VAGINAL HYSTERECTOMY  1973    Social History   Tobacco Use  Smoking Status Former Smoker  . Packs/day: 0.20  . Years: 5.00  . Pack years: 1.00  . Types: Cigarettes  . Quit date: 09/23/1980  . Years since quitting: 40.3  Smokeless Tobacco Never Used    Social History   Substance and Sexual Activity  Alcohol Use Yes   Comment: 1 glass 5-6 times weekly    Family History  Problem Relation Age of Onset  . Hypertension Sister   . Lung cancer Father   . Hypertension Mother   . Multiple sclerosis Daughter   . Breast cancer Other        maternal aunt  . Allergies Daughter   . Allergies Daughter   . Allergies Son     Review of Systems: As noted in history of present illness.  All  other systems were reviewed and are negative.  Physical Exam: BP 122/84   Pulse 64   Ht 5\' 3"  (1.6 m)   Wt 187 lb (84.8 kg)   SpO2 97%   BMI 33.13 kg/m  GENERAL:  Well appearing, obese WF in NAD HEENT:  PERRL, EOMI, sclera are clear. Oropharynx is clear. NECK:  No jugular venous distention, carotid upstroke brisk and symmetric, no bruits, no thyromegaly or adenopathy LUNGS:  Clear to auscultation bilaterally CHEST:  Unremarkable HEART:  RRR,  PMI not displaced or sustained,S1 and S2 within normal limits, no S3, no S4: no clicks, no rubs, no murmurs ABD:  Soft, nontender. BS +, no masses or bruits. No hepatomegaly, no splenomegaly EXT:  2 + pulses throughout, no edema, no cyanosis no clubbing SKIN:  Warm and dry.  No rashes NEURO:  Alert and oriented x 3. Cranial nerves II through XII intact. PSYCH:  Cognitively intact    LBORATORY DATA:  Lab Results  Component Value Date   WBC 9.5 02/27/2015   HGB 10.5 (L) 02/27/2015   HCT 33.0 (L) 02/27/2015   PLT 354 02/27/2015   GLUCOSE 109 (H) 04/21/2019   ALT 38 (H) 04/21/2019   AST 37 04/21/2019   NA 138 04/21/2019   K 3.8 04/21/2019   CL 95 (L) 04/21/2019   CREATININE 0.99 04/21/2019   BUN 19 04/21/2019   CO2 26 04/21/2019   Labs dated 02/06/16: cholesterol 236, triglycerides 132, HDL 69, LDL 141. CMET and TSH normal.  November 27/2017: A1c 5.6%. Dated 02/12/17: cholesterol 251, triglycerides 226, HDL 59, LDL 147.  Dated 07/23/17: A1c 5.5% Dated 09/25/17: normal chemistries and TSH. Dated 03/29/19: cholesterol 275, triglycerides 333, HDL 63, LDL 145. LFTs normal.  Dated 11/18/19: Normal BMET. Dated 01/03/20: A1c 5.5% Dated 04/21/20: cholesterol 221, triglycerides 317, HDL 62, LDL 96. Potassium 3.6. CMET, CBC, TSH normal Dated 09/06/20: A1c 5.2%.   Event monitor: Study Highlights 05/12/19   Normal sinus rhythm  Rare PVCs, trigeminy  One 6 beat run of NSVT  Rare PACs.  6 runs of SVT. longest 18 seconds at rate max 152.  symptomatic.    Assessment / Plan: 1. Atrial fibrillation, well controlled on sotalol. No episodes in several years. She has a [place] Vasc score of 3. Intolerant of Xarelto and possibly Eliquis due to drug rash. Will continue to monitor with Cardiomobile. Stay on ASA 81 mg daily.   2. Hypertension. Blood pressure is well controlled.   3. PMR on steroids.   4. OSA on CPAP- followed by [name]  5. PVCs and short runs of SVT. Intolerant of beta blockers and calcium channel blockers in the past.  Symptoms better on magnesium. Continue Sotalol.  Follow up in one year

## 2021-01-25 ENCOUNTER — Encounter: Payer: Self-pay | Admitting: Cardiology

## 2021-01-25 ENCOUNTER — Ambulatory Visit: Payer: PPO | Admitting: Cardiology

## 2021-01-25 ENCOUNTER — Other Ambulatory Visit: Payer: Self-pay

## 2021-01-25 VITALS — BP 122/84 | HR 64 | Ht 63.0 in | Wt 187.0 lb

## 2021-01-25 DIAGNOSIS — I480 Paroxysmal atrial fibrillation: Secondary | ICD-10-CM | POA: Diagnosis not present

## 2021-01-25 DIAGNOSIS — I493 Ventricular premature depolarization: Secondary | ICD-10-CM

## 2021-01-25 DIAGNOSIS — I10 Essential (primary) hypertension: Secondary | ICD-10-CM | POA: Diagnosis not present

## 2021-01-25 NOTE — Addendum Note (Signed)
Addended by: Kathyrn Lass on: 01/25/2021 10:41 AM   Modules accepted: Orders

## 2021-02-13 DIAGNOSIS — D100 Benign neoplasm of lip: Secondary | ICD-10-CM | POA: Diagnosis not present

## 2021-02-27 DIAGNOSIS — Z7952 Long term (current) use of systemic steroids: Secondary | ICD-10-CM | POA: Diagnosis not present

## 2021-02-27 DIAGNOSIS — E669 Obesity, unspecified: Secondary | ICD-10-CM | POA: Diagnosis not present

## 2021-02-27 DIAGNOSIS — M353 Polymyalgia rheumatica: Secondary | ICD-10-CM | POA: Diagnosis not present

## 2021-02-27 DIAGNOSIS — Z6833 Body mass index (BMI) 33.0-33.9, adult: Secondary | ICD-10-CM | POA: Diagnosis not present

## 2021-03-08 DIAGNOSIS — E119 Type 2 diabetes mellitus without complications: Secondary | ICD-10-CM | POA: Diagnosis not present

## 2021-03-09 ENCOUNTER — Telehealth: Payer: Self-pay | Admitting: Pulmonary Disease

## 2021-03-09 NOTE — Telephone Encounter (Signed)
She is intolerant of CPAP and is requesting referral to dentist Dr Tamala Bari That is okay with me.  Dr. Johney Maine is recommending a repeat home sleep test which we can arrange for and provide him with the results. Please order if patient willing

## 2021-03-12 NOTE — Telephone Encounter (Signed)
Tried calling the pt and there was no answer- LMTCB. Letter mailed.

## 2021-03-13 ENCOUNTER — Telehealth: Payer: Self-pay | Admitting: Pulmonary Disease

## 2021-03-13 ENCOUNTER — Encounter: Payer: Self-pay | Admitting: *Deleted

## 2021-03-13 DIAGNOSIS — G4733 Obstructive sleep apnea (adult) (pediatric): Secondary | ICD-10-CM

## 2021-03-13 NOTE — Telephone Encounter (Signed)
Lm for patient.  

## 2021-03-13 NOTE — Telephone Encounter (Signed)
[name] Noel, MD      3:24 PM Note She is intolerant of CPAP and is requesting referral to dentist Dr [name] [name] That is okay with me.  [name] is recommending a repeat home sleep test which we can arrange for and provide him with the results. Please order if patient willing      I spoke with the pt and notified of response per [name]. She verbalized understanding. Agreed to repeat HST and this order placed as well as the Dr [name] [name] referral. Nothing further needed

## 2021-03-28 DIAGNOSIS — E876 Hypokalemia: Secondary | ICD-10-CM | POA: Diagnosis not present

## 2021-03-28 DIAGNOSIS — E119 Type 2 diabetes mellitus without complications: Secondary | ICD-10-CM | POA: Diagnosis not present

## 2021-03-28 DIAGNOSIS — I10 Essential (primary) hypertension: Secondary | ICD-10-CM | POA: Diagnosis not present

## 2021-03-28 DIAGNOSIS — E7800 Pure hypercholesterolemia, unspecified: Secondary | ICD-10-CM | POA: Diagnosis not present

## 2021-04-12 DIAGNOSIS — G4733 Obstructive sleep apnea (adult) (pediatric): Secondary | ICD-10-CM | POA: Diagnosis not present

## 2021-05-08 ENCOUNTER — Ambulatory Visit: Payer: PPO

## 2021-05-08 ENCOUNTER — Other Ambulatory Visit: Payer: Self-pay

## 2021-05-08 DIAGNOSIS — G4733 Obstructive sleep apnea (adult) (pediatric): Secondary | ICD-10-CM

## 2021-05-09 ENCOUNTER — Telehealth: Payer: Self-pay | Admitting: Pulmonary Disease

## 2021-05-09 DIAGNOSIS — G4733 Obstructive sleep apnea (adult) (pediatric): Secondary | ICD-10-CM | POA: Diagnosis not present

## 2021-05-09 NOTE — Telephone Encounter (Signed)
Please let her know that sleep study showed severe OSA, AHI 51/[place]. With extensive desaturations  Options include -Continue on CPAP -Okay to explore inspire option if she is truly frustrated with CPAP  Please send copy of report to ENT Dr. Wilburn Cornelia

## 2021-05-10 NOTE — Telephone Encounter (Signed)
Called and spoke with patient to let her know of sleep study results. Advised her that as far as treatment options she could either continue CPAP or we can refer her to ENT for see if she is a candidate for [facility] device. She said she would like to talk it over with her husband and call us back. We will wait to hear back from patient.

## 2021-05-11 NOTE — Telephone Encounter (Signed)
LMTCB  Referral to ENT has been placed.

## 2021-05-22 DIAGNOSIS — R52 Pain, unspecified: Secondary | ICD-10-CM | POA: Insufficient documentation

## 2021-05-22 DIAGNOSIS — M1811 Unilateral primary osteoarthritis of first carpometacarpal joint, right hand: Secondary | ICD-10-CM | POA: Diagnosis not present

## 2021-05-22 DIAGNOSIS — M13131 Monoarthritis, not elsewhere classified, right wrist: Secondary | ICD-10-CM | POA: Diagnosis not present

## 2021-05-30 DIAGNOSIS — E785 Hyperlipidemia, unspecified: Secondary | ICD-10-CM | POA: Diagnosis not present

## 2021-05-30 DIAGNOSIS — E119 Type 2 diabetes mellitus without complications: Secondary | ICD-10-CM | POA: Diagnosis not present

## 2021-06-04 DIAGNOSIS — Z9989 Dependence on other enabling machines and devices: Secondary | ICD-10-CM | POA: Diagnosis not present

## 2021-06-04 DIAGNOSIS — G4733 Obstructive sleep apnea (adult) (pediatric): Secondary | ICD-10-CM | POA: Diagnosis not present

## 2021-06-04 DIAGNOSIS — Z6832 Body mass index (BMI) 32.0-32.9, adult: Secondary | ICD-10-CM | POA: Diagnosis not present

## 2021-06-06 DIAGNOSIS — M47816 Spondylosis without myelopathy or radiculopathy, lumbar region: Secondary | ICD-10-CM | POA: Diagnosis not present

## 2021-06-06 DIAGNOSIS — E119 Type 2 diabetes mellitus without complications: Secondary | ICD-10-CM | POA: Diagnosis not present

## 2021-06-06 DIAGNOSIS — I10 Essential (primary) hypertension: Secondary | ICD-10-CM | POA: Diagnosis not present

## 2021-06-06 DIAGNOSIS — G4733 Obstructive sleep apnea (adult) (pediatric): Secondary | ICD-10-CM | POA: Diagnosis not present

## 2021-06-06 DIAGNOSIS — I480 Paroxysmal atrial fibrillation: Secondary | ICD-10-CM | POA: Diagnosis not present

## 2021-06-06 DIAGNOSIS — Z4789 Encounter for other orthopedic aftercare: Secondary | ICD-10-CM | POA: Diagnosis not present

## 2021-06-06 DIAGNOSIS — K635 Polyp of colon: Secondary | ICD-10-CM | POA: Diagnosis not present

## 2021-06-06 DIAGNOSIS — E785 Hyperlipidemia, unspecified: Secondary | ICD-10-CM | POA: Diagnosis not present

## 2021-06-06 DIAGNOSIS — M353 Polymyalgia rheumatica: Secondary | ICD-10-CM | POA: Diagnosis not present

## 2021-06-06 DIAGNOSIS — Z0000 Encounter for general adult medical examination without abnormal findings: Secondary | ICD-10-CM | POA: Diagnosis not present

## 2021-06-06 DIAGNOSIS — M8580 Other specified disorders of bone density and structure, unspecified site: Secondary | ICD-10-CM | POA: Diagnosis not present

## 2021-06-06 DIAGNOSIS — Z23 Encounter for immunization: Secondary | ICD-10-CM | POA: Diagnosis not present

## 2021-06-07 ENCOUNTER — Other Ambulatory Visit: Payer: Self-pay | Admitting: Internal Medicine

## 2021-06-07 DIAGNOSIS — E785 Hyperlipidemia, unspecified: Secondary | ICD-10-CM

## 2021-06-20 DIAGNOSIS — Z4789 Encounter for other orthopedic aftercare: Secondary | ICD-10-CM | POA: Diagnosis not present

## 2021-06-20 DIAGNOSIS — M13131 Monoarthritis, not elsewhere classified, right wrist: Secondary | ICD-10-CM | POA: Diagnosis not present

## 2021-07-03 ENCOUNTER — Ambulatory Visit
Admission: RE | Admit: 2021-07-03 | Discharge: 2021-07-03 | Disposition: A | Discharge status: Home / Self Care | Payer: PPO | Source: Ambulatory Visit | Attending: Internal Medicine | Admitting: Internal Medicine

## 2021-07-03 DIAGNOSIS — E785 Hyperlipidemia, unspecified: Secondary | ICD-10-CM

## 2021-07-04 DIAGNOSIS — M79644 Pain in right finger(s): Secondary | ICD-10-CM | POA: Diagnosis not present

## 2021-07-04 DIAGNOSIS — Z4789 Encounter for other orthopedic aftercare: Secondary | ICD-10-CM | POA: Diagnosis not present

## 2021-07-10 DIAGNOSIS — M79644 Pain in right finger(s): Secondary | ICD-10-CM | POA: Diagnosis not present

## 2021-07-17 DIAGNOSIS — Z1231 Encounter for screening mammogram for malignant neoplasm of breast: Secondary | ICD-10-CM | POA: Diagnosis not present

## 2021-07-18 DIAGNOSIS — M79644 Pain in right finger(s): Secondary | ICD-10-CM | POA: Diagnosis not present

## 2021-07-24 DIAGNOSIS — M79644 Pain in right finger(s): Secondary | ICD-10-CM | POA: Diagnosis not present

## 2021-08-01 DIAGNOSIS — M79644 Pain in right finger(s): Secondary | ICD-10-CM | POA: Diagnosis not present

## 2021-08-01 DIAGNOSIS — Z4789 Encounter for other orthopedic aftercare: Secondary | ICD-10-CM | POA: Diagnosis not present

## 2021-08-29 DIAGNOSIS — M353 Polymyalgia rheumatica: Secondary | ICD-10-CM | POA: Diagnosis not present

## 2021-08-29 DIAGNOSIS — E669 Obesity, unspecified: Secondary | ICD-10-CM | POA: Diagnosis not present

## 2021-08-29 DIAGNOSIS — M79644 Pain in right finger(s): Secondary | ICD-10-CM | POA: Diagnosis not present

## 2021-08-29 DIAGNOSIS — Z7952 Long term (current) use of systemic steroids: Secondary | ICD-10-CM | POA: Diagnosis not present

## 2021-08-29 DIAGNOSIS — Z6831 Body mass index (BMI) 31.0-31.9, adult: Secondary | ICD-10-CM | POA: Diagnosis not present

## 2021-09-04 ENCOUNTER — Other Ambulatory Visit: Payer: Self-pay | Admitting: Otolaryngology

## 2021-09-12 DIAGNOSIS — E119 Type 2 diabetes mellitus without complications: Secondary | ICD-10-CM | POA: Diagnosis not present

## 2021-09-12 DIAGNOSIS — I10 Essential (primary) hypertension: Secondary | ICD-10-CM | POA: Diagnosis not present

## 2021-09-12 DIAGNOSIS — E785 Hyperlipidemia, unspecified: Secondary | ICD-10-CM | POA: Diagnosis not present

## 2021-09-12 DIAGNOSIS — G720 Drug-induced myopathy: Secondary | ICD-10-CM | POA: Diagnosis not present

## 2021-09-21 ENCOUNTER — Encounter (HOSPITAL_BASED_OUTPATIENT_CLINIC_OR_DEPARTMENT_OTHER): Payer: Self-pay | Admitting: Otolaryngology

## 2021-09-25 ENCOUNTER — Encounter (HOSPITAL_BASED_OUTPATIENT_CLINIC_OR_DEPARTMENT_OTHER)
Admission: RE | Admit: 2021-09-25 | Discharge: 2021-09-25 | Disposition: A | Discharge status: Home / Self Care | Payer: Medicare Other | Source: Ambulatory Visit | Attending: Otolaryngology | Admitting: Otolaryngology

## 2021-09-25 DIAGNOSIS — Z01812 Encounter for preprocedural laboratory examination: Secondary | ICD-10-CM | POA: Diagnosis not present

## 2021-09-25 DIAGNOSIS — I10 Essential (primary) hypertension: Secondary | ICD-10-CM | POA: Diagnosis not present

## 2021-09-25 LAB — BASIC METABOLIC PANEL
Anion gap: 16 — ABNORMAL HIGH (ref 5–15)
BUN: 13 mg/dL (ref 8–23)
CO2: 21 mmol/L — ABNORMAL LOW (ref 22–32)
Calcium: 8.9 mg/dL (ref 8.9–10.3)
Chloride: 96 mmol/L — ABNORMAL LOW (ref 98–111)
Creatinine, Ser: 1.03 mg/dL — ABNORMAL HIGH (ref 0.44–1.00)
GFR, Estimated: 56 mL/min — ABNORMAL LOW (ref >60)
Glucose, Bld: 117 mg/dL — ABNORMAL HIGH (ref 70–99)
Potassium: 3.9 mmol/L (ref 3.5–5.1)
Sodium: 133 mmol/L — ABNORMAL LOW (ref 135–145)

## 2021-09-27 DIAGNOSIS — R0981 Nasal congestion: Secondary | ICD-10-CM | POA: Diagnosis not present

## 2021-09-27 DIAGNOSIS — Z6832 Body mass index (BMI) 32.0-32.9, adult: Secondary | ICD-10-CM | POA: Diagnosis not present

## 2021-09-27 DIAGNOSIS — Z01419 Encounter for gynecological examination (general) (routine) without abnormal findings: Secondary | ICD-10-CM | POA: Diagnosis not present

## 2021-09-28 DIAGNOSIS — E119 Type 2 diabetes mellitus without complications: Secondary | ICD-10-CM | POA: Diagnosis not present

## 2021-09-28 DIAGNOSIS — E7800 Pure hypercholesterolemia, unspecified: Secondary | ICD-10-CM | POA: Diagnosis not present

## 2021-09-28 DIAGNOSIS — I10 Essential (primary) hypertension: Secondary | ICD-10-CM | POA: Diagnosis not present

## 2021-09-28 DIAGNOSIS — M353 Polymyalgia rheumatica: Secondary | ICD-10-CM | POA: Diagnosis not present

## 2021-10-03 ENCOUNTER — Ambulatory Visit (HOSPITAL_BASED_OUTPATIENT_CLINIC_OR_DEPARTMENT_OTHER): Admission: RE | Admit: 2021-10-03 | Payer: Medicare Other | Source: Ambulatory Visit | Admitting: Otolaryngology

## 2021-10-03 DIAGNOSIS — I10 Essential (primary) hypertension: Secondary | ICD-10-CM

## 2021-10-03 SURGERY — DRUG INDUCED SLEEP ENDOSCOPY
Anesthesia: General

## 2021-10-15 DIAGNOSIS — G4733 Obstructive sleep apnea (adult) (pediatric): Secondary | ICD-10-CM | POA: Diagnosis not present

## 2021-10-24 DIAGNOSIS — M47812 Spondylosis without myelopathy or radiculopathy, cervical region: Secondary | ICD-10-CM | POA: Diagnosis not present

## 2021-10-24 DIAGNOSIS — M9903 Segmental and somatic dysfunction of lumbar region: Secondary | ICD-10-CM | POA: Diagnosis not present

## 2021-10-24 DIAGNOSIS — M9901 Segmental and somatic dysfunction of cervical region: Secondary | ICD-10-CM | POA: Diagnosis not present

## 2021-10-24 DIAGNOSIS — M5442 Lumbago with sciatica, left side: Secondary | ICD-10-CM | POA: Diagnosis not present

## 2021-10-25 DIAGNOSIS — M47812 Spondylosis without myelopathy or radiculopathy, cervical region: Secondary | ICD-10-CM | POA: Diagnosis not present

## 2021-10-25 DIAGNOSIS — M9901 Segmental and somatic dysfunction of cervical region: Secondary | ICD-10-CM | POA: Diagnosis not present

## 2021-10-25 DIAGNOSIS — M9903 Segmental and somatic dysfunction of lumbar region: Secondary | ICD-10-CM | POA: Diagnosis not present

## 2021-10-25 DIAGNOSIS — M5442 Lumbago with sciatica, left side: Secondary | ICD-10-CM | POA: Diagnosis not present

## 2021-10-30 DIAGNOSIS — M9903 Segmental and somatic dysfunction of lumbar region: Secondary | ICD-10-CM | POA: Diagnosis not present

## 2021-10-30 DIAGNOSIS — M9901 Segmental and somatic dysfunction of cervical region: Secondary | ICD-10-CM | POA: Diagnosis not present

## 2021-10-30 DIAGNOSIS — M47812 Spondylosis without myelopathy or radiculopathy, cervical region: Secondary | ICD-10-CM | POA: Diagnosis not present

## 2021-10-30 DIAGNOSIS — M5442 Lumbago with sciatica, left side: Secondary | ICD-10-CM | POA: Diagnosis not present

## 2021-11-01 DIAGNOSIS — M47812 Spondylosis without myelopathy or radiculopathy, cervical region: Secondary | ICD-10-CM | POA: Diagnosis not present

## 2021-11-01 DIAGNOSIS — M9901 Segmental and somatic dysfunction of cervical region: Secondary | ICD-10-CM | POA: Diagnosis not present

## 2021-11-01 DIAGNOSIS — M9903 Segmental and somatic dysfunction of lumbar region: Secondary | ICD-10-CM | POA: Diagnosis not present

## 2021-11-01 DIAGNOSIS — M5442 Lumbago with sciatica, left side: Secondary | ICD-10-CM | POA: Diagnosis not present

## 2021-11-04 ENCOUNTER — Other Ambulatory Visit: Payer: Self-pay | Admitting: Cardiology

## 2021-11-06 DIAGNOSIS — M5442 Lumbago with sciatica, left side: Secondary | ICD-10-CM | POA: Diagnosis not present

## 2021-11-06 DIAGNOSIS — M9903 Segmental and somatic dysfunction of lumbar region: Secondary | ICD-10-CM | POA: Diagnosis not present

## 2021-11-06 DIAGNOSIS — M9901 Segmental and somatic dysfunction of cervical region: Secondary | ICD-10-CM | POA: Diagnosis not present

## 2021-11-06 DIAGNOSIS — M47812 Spondylosis without myelopathy or radiculopathy, cervical region: Secondary | ICD-10-CM | POA: Diagnosis not present

## 2021-11-07 ENCOUNTER — Other Ambulatory Visit: Payer: Self-pay

## 2021-11-07 ENCOUNTER — Encounter (HOSPITAL_BASED_OUTPATIENT_CLINIC_OR_DEPARTMENT_OTHER): Payer: Self-pay | Admitting: Otolaryngology

## 2021-11-08 ENCOUNTER — Encounter (HOSPITAL_BASED_OUTPATIENT_CLINIC_OR_DEPARTMENT_OTHER)
Admission: RE | Admit: 2021-11-08 | Discharge: 2021-11-08 | Disposition: A | Discharge status: Home / Self Care | Payer: Medicare Other | Source: Ambulatory Visit | Attending: Otolaryngology | Admitting: Otolaryngology

## 2021-11-08 DIAGNOSIS — I10 Essential (primary) hypertension: Secondary | ICD-10-CM | POA: Insufficient documentation

## 2021-11-08 DIAGNOSIS — M5442 Lumbago with sciatica, left side: Secondary | ICD-10-CM | POA: Diagnosis not present

## 2021-11-08 DIAGNOSIS — Z01812 Encounter for preprocedural laboratory examination: Secondary | ICD-10-CM | POA: Insufficient documentation

## 2021-11-08 DIAGNOSIS — M9903 Segmental and somatic dysfunction of lumbar region: Secondary | ICD-10-CM | POA: Diagnosis not present

## 2021-11-08 DIAGNOSIS — M47812 Spondylosis without myelopathy or radiculopathy, cervical region: Secondary | ICD-10-CM | POA: Diagnosis not present

## 2021-11-08 DIAGNOSIS — M9901 Segmental and somatic dysfunction of cervical region: Secondary | ICD-10-CM | POA: Diagnosis not present

## 2021-11-08 LAB — BASIC METABOLIC PANEL
Anion gap: 13 (ref 5–15)
BUN: 16 mg/dL (ref 8–23)
CO2: 26 mmol/L (ref 22–32)
Calcium: 8.8 mg/dL — ABNORMAL LOW (ref 8.9–10.3)
Chloride: 96 mmol/L — ABNORMAL LOW (ref 98–111)
Creatinine, Ser: 0.89 mg/dL (ref 0.44–1.00)
GFR, Estimated: >60 mL/min (ref >60)
Glucose, Bld: 121 mg/dL — ABNORMAL HIGH (ref 70–99)
Potassium: 3.5 mmol/L (ref 3.5–5.1)
Sodium: 135 mmol/L (ref 135–145)

## 2021-11-14 ENCOUNTER — Ambulatory Visit (HOSPITAL_BASED_OUTPATIENT_CLINIC_OR_DEPARTMENT_OTHER): Payer: Medicare Other | Admitting: Certified Registered"

## 2021-11-14 ENCOUNTER — Other Ambulatory Visit: Payer: Self-pay

## 2021-11-14 ENCOUNTER — Encounter (HOSPITAL_BASED_OUTPATIENT_CLINIC_OR_DEPARTMENT_OTHER): Payer: Self-pay | Admitting: Otolaryngology

## 2021-11-14 ENCOUNTER — Ambulatory Visit (HOSPITAL_BASED_OUTPATIENT_CLINIC_OR_DEPARTMENT_OTHER)
Admission: RE | Admit: 2021-11-14 | Discharge: 2021-11-14 | Disposition: A | Discharge status: Home / Self Care | Payer: Medicare Other | Attending: Otolaryngology | Admitting: Otolaryngology

## 2021-11-14 ENCOUNTER — Encounter (HOSPITAL_BASED_OUTPATIENT_CLINIC_OR_DEPARTMENT_OTHER)
Admission: RE | Disposition: A | Discharge status: Home / Self Care | Payer: Self-pay | Source: Home / Self Care | Attending: Otolaryngology

## 2021-11-14 DIAGNOSIS — I10 Essential (primary) hypertension: Secondary | ICD-10-CM

## 2021-11-14 DIAGNOSIS — I4891 Unspecified atrial fibrillation: Secondary | ICD-10-CM | POA: Insufficient documentation

## 2021-11-14 DIAGNOSIS — E119 Type 2 diabetes mellitus without complications: Secondary | ICD-10-CM | POA: Diagnosis not present

## 2021-11-14 DIAGNOSIS — K759 Inflammatory liver disease, unspecified: Secondary | ICD-10-CM | POA: Diagnosis not present

## 2021-11-14 DIAGNOSIS — Z87891 Personal history of nicotine dependence: Secondary | ICD-10-CM | POA: Diagnosis not present

## 2021-11-14 DIAGNOSIS — Z6830 Body mass index (BMI) 30.0-30.9, adult: Secondary | ICD-10-CM | POA: Insufficient documentation

## 2021-11-14 DIAGNOSIS — K219 Gastro-esophageal reflux disease without esophagitis: Secondary | ICD-10-CM | POA: Diagnosis not present

## 2021-11-14 DIAGNOSIS — G4733 Obstructive sleep apnea (adult) (pediatric): Secondary | ICD-10-CM | POA: Diagnosis not present

## 2021-11-14 DIAGNOSIS — J45909 Unspecified asthma, uncomplicated: Secondary | ICD-10-CM | POA: Insufficient documentation

## 2021-11-14 DIAGNOSIS — M1990 Unspecified osteoarthritis, unspecified site: Secondary | ICD-10-CM

## 2021-11-14 HISTORY — DX: Sleep apnea, unspecified: G47.30

## 2021-11-14 HISTORY — PX: DRUG INDUCED ENDOSCOPY: SHX6808

## 2021-11-14 HISTORY — DX: Gastro-esophageal reflux disease without esophagitis: K21.9

## 2021-11-14 LAB — GLUCOSE, CAPILLARY
Glucose-Capillary: 96 mg/dL (ref 70–99)
Glucose-Capillary: 94 mg/dL (ref 70–99)

## 2021-11-14 SURGERY — DRUG INDUCED SLEEP ENDOSCOPY
Anesthesia: General | Site: Mouth

## 2021-11-14 MED ORDER — ONDANSETRON HCL 4 MG/2ML IJ SOLN
INTRAMUSCULAR | Status: DC | PRN
  Administered 2021-11-14: 4 mg via INTRAVENOUS

## 2021-11-14 MED ORDER — LIDOCAINE 2% (20 MG/ML) 5 ML SYRINGE
INTRAMUSCULAR | Status: DC | PRN
Start: 2021-11-14 — End: 2021-11-14
  Administered 2021-11-14: 30 mg via INTRAVENOUS

## 2021-11-14 MED ORDER — OXYMETAZOLINE HCL 0.05 % NA SOLN
NASAL | Status: AC
  Filled 2021-11-14: qty 30

## 2021-11-14 MED ORDER — LACTATED RINGERS IV SOLN: INTRAVENOUS | Status: DC

## 2021-11-14 MED ORDER — LIDOCAINE-EPINEPHRINE 1 %-1:100000 IJ SOLN
INTRAMUSCULAR | Status: AC
  Filled 2021-11-14: qty 1

## 2021-11-14 MED ORDER — PROPOFOL 500 MG/50ML IV EMUL
INTRAVENOUS | Status: DC | PRN
  Administered 2021-11-14: 35 ug/kg/min via INTRAVENOUS

## 2021-11-14 MED ORDER — OXYMETAZOLINE HCL 0.05 % NA SOLN
NASAL | Status: DC | PRN
Start: 2021-11-14 — End: 2021-11-14
  Administered 2021-11-14: 1 via TOPICAL

## 2021-11-14 SURGICAL SUPPLY — 13 items
CANISTER SUCT 1200ML W/VALVE (MISCELLANEOUS) ×2 IMPLANT
GLOVE SURG ENC MOIS LTX SZ7.5 (GLOVE) ×2 IMPLANT
KIT CLEAN ENDO (MISCELLANEOUS) ×2 IMPLANT
NDL HYPO 27GX1-1/4 (NEEDLE) IMPLANT
NEEDLE HYPO 27GX1-1/4 (NEEDLE) ×2 IMPLANT
PACK BASIN DAY SURGERY FS (CUSTOM PROCEDURE TRAY) ×2 IMPLANT
PATTIES SURGICAL .5 X3 (DISPOSABLE) ×2 IMPLANT
SHEET MEDIUM DRAPE 40X70 STRL (DRAPES) ×1 IMPLANT
SOL ANTI FOG 6CC (MISCELLANEOUS) ×1 IMPLANT
SOLUTION ANTI FOG 6CC (MISCELLANEOUS) ×1
SYR CONTROL 10ML LL (SYRINGE) ×1 IMPLANT
TOWEL GREEN STERILE FF (TOWEL DISPOSABLE) ×2 IMPLANT
TUBE CONNECTING 20X1/4 (TUBING) ×2 IMPLANT

## 2021-11-14 NOTE — Brief Op Note (Signed)
11/14/2021  7:43 AM  PATIENT:  [name]  76 y.o. female  PRE-OPERATIVE DIAGNOSIS:  OBSTUCTIVE SLEEP APNEA  POST-OPERATIVE DIAGNOSIS:  OBSTUCTIVE SLEEP APNEA  PROCEDURE:  Procedure(s): DRUG INDUCED SLEEP ENDOSCOPY (N/A)  SURGEON:  Surgeon(s) and Role:    [name], MD - Primary  PHYSICIAN ASSISTANT:   ASSISTANTS: none   ANESTHESIA:   IV sedation  EBL:  None   BLOOD ADMINISTERED:none  DRAINS: none   LOCAL MEDICATIONS USED:  NONE  SPECIMEN:  No Specimen  DISPOSITION OF SPECIMEN:  N/A  COUNTS:  YES  TOURNIQUET:  * No tourniquets in log *  DICTATION: .Note written in EPIC  PLAN OF CARE: Discharge to home after PACU  PATIENT DISPOSITION:  PACU - hemodynamically stable.   Delay start of Pharmacological VTE agent (>24hrs) due to surgical blood loss or risk of bleeding: no

## 2021-11-14 NOTE — Anesthesia Postprocedure Evaluation (Signed)
Anesthesia Post Note  Patient: [name] [name]  Procedure(s) Performed: DRUG INDUCED SLEEP ENDOSCOPY (Mouth)     Patient location during evaluation: PACU Anesthesia Type: General Level of consciousness: awake and alert Pain management: pain level controlled Vital Signs Assessment: post-procedure vital signs reviewed and stable Respiratory status: spontaneous breathing, nonlabored ventilation, respiratory function stable and patient connected to nasal cannula oxygen Cardiovascular status: blood pressure returned to baseline and stable Postop Assessment: no apparent nausea or vomiting Anesthetic complications: no   No notable events documented.  Last Vitals:  Vitals:   11/14/21 0641 11/14/21 0750  BP: (!) 175/63 114/62  Pulse: 72 61  Resp: 16   Temp: 36.5 C 36.6 C  SpO2: 100% 96%    Last Pain:  Vitals:   11/14/21 0750  TempSrc:   PainSc: 0-No pain                 [name] L Treyshawn Muldrew

## 2021-11-14 NOTE — Op Note (Signed)
Preop diagnosis: Obstructive sleep apnea Postop diagnosis: same Procedure: Drug-induced sleep endoscopy Surgeon: Redmond Baseman Anesth: IV sedation Compl: None Findings: There is 100% anterior-posterior collapse at the velum making her a candidate for hypoglossal nerve stimulator placement.  There was also moderate anterior-posterior collapse at the tongue base. Description:  After discussing risks, benefits, and alternatives, the patient was brought to the operative suite and placed on the operative table in the supine position.  Anesthesia was induced and the patient was given light sedation to simulate natural sleep. When the proper level was reached, an Afrin-soaked pledget was placed in the right nasal passage for a couple of minutes and then removed.  The fiberoptic laryngoscope was then passed to view the pharynx and larynx.  Findings are noted above and the exam was recorded.  After completion, the scope was removed and the patient was returned to anesthesia for wakeup and was moved to the recovery room in stable condition.

## 2021-11-14 NOTE — Anesthesia Procedure Notes (Signed)
Procedure Name: MAC Date/Time: 11/14/2021 7:42 AM Performed by: Signe Colt, CRNA Pre-anesthesia Checklist: Patient identified, Emergency Drugs available, Suction available, Patient being monitored and Timeout performed Patient Re-evaluated:Patient Re-evaluated prior to induction Oxygen Delivery Method: Simple face mask

## 2021-11-14 NOTE — Transfer of Care (Signed)
Immediate Anesthesia Transfer of Care Note  Patient: [name] [name]  Procedure(s) Performed: DRUG INDUCED SLEEP ENDOSCOPY (Mouth)  Patient Location: PACU  Anesthesia Type:MAC  Level of Consciousness: awake, alert , oriented and patient cooperative  Airway & Oxygen Therapy: Patient Spontanous Breathing  Post-op Assessment: Report given to RN and Post -op Vital signs reviewed and stable  Post vital signs: Reviewed and stable  Last Vitals:  Vitals Value Taken Time  BP    Temp    Pulse    Resp    SpO2      Last Pain:  Vitals:   11/14/21 0641  TempSrc: Oral  PainSc: 0-No pain         Complications: No notable events documented.

## 2021-11-14 NOTE — Anesthesia Preprocedure Evaluation (Addendum)
Anesthesia Evaluation  Patient identified by MRN, date of birth, ID band Patient awake    Reviewed: Allergy & Precautions, NPO status , Patient's Chart, lab work & pertinent test results, reviewed documented beta blocker date and time   Airway Mallampati: II  TM Distance: >3 FB Neck ROM: Full    Dental no notable dental hx. (+) Teeth Intact, Dental Advisory Given   Pulmonary asthma , sleep apnea , former smoker,    Pulmonary exam normal breath sounds clear to auscultation       Cardiovascular hypertension, Pt. on medications and Pt. on home beta blockers Normal cardiovascular exam+ dysrhythmias Atrial Fibrillation  Rhythm:Regular Rate:Normal     Neuro/Psych negative neurological ROS  negative psych ROS   GI/Hepatic GERD  Controlled,(+) Hepatitis -  Endo/Other  diabetes, Type 2  Renal/GU negative Renal ROS  negative genitourinary   Musculoskeletal  (+) Arthritis ,   Abdominal   Peds  Hematology negative hematology ROS (+)   Anesthesia Other Findings   Reproductive/Obstetrics                            Anesthesia Physical Anesthesia Plan  ASA: 3  Anesthesia Plan: General   Post-op Pain Management: Minimal or no pain anticipated   Induction: Intravenous  PONV Risk Score and Plan: 3 and Propofol infusion and Treatment may vary due to age or medical condition  Airway Management Planned: Natural Airway  Additional Equipment:   Intra-op Plan:   Post-operative Plan:   Informed Consent: I have reviewed the patients History and Physical, chart, labs and discussed the procedure including the risks, benefits and alternatives for the proposed anesthesia with the patient or authorized representative who has indicated his/her understanding and acceptance.     Dental advisory given  Plan Discussed with: CRNA  Anesthesia Plan Comments:         Anesthesia Quick Evaluation

## 2021-11-14 NOTE — H&P (Signed)
[name] [name] is an 76 y.o. female.   Chief Complaint: Sleep apnea HPI: 76 year old female with sleep apnea who has been unable to tolerate CPAP.  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   Polymyalgia ([place]) 03/03/2015   Polymyositis ([place]) 03/03/2015   Sleep apnea    Type 2 diabetes mellitus ([place])     Past Surgical History:  Procedure Laterality Date   ABDOMINAL HYSTERECTOMY     APPENDECTOMY  09/24/1971   DG THUMB RIGHT HAND ([facility] HX)     FOOT SURGERY Left 09/24/2007   KNEE ARTHROPLASTY  09/23/2005   right   OVARY SURGERY  09/24/1999   TUBAL LIGATION  09/23/1970   VAGINAL HYSTERECTOMY  09/24/1971    Family History  Problem Relation Age of Onset   Hypertension Sister    Lung cancer Father    Hypertension Mother    Multiple sclerosis Daughter    Breast cancer Other        maternal aunt   Allergies Daughter    Allergies Daughter    Allergies Son    Social History:  reports that she quit smoking about 41 years ago. Her smoking use included cigarettes. She has a 1.00 pack-year smoking history. She has never used smokeless tobacco. She reports current alcohol use. She reports that she does not use drugs.  Allergies:  Allergies  Allergen Reactions   Atorvastatin Other (See Comments)    Muscle cramps severe  Other reaction(s): Cramps (ALLERGY/intolerance), Other Muscle cramps severe Muscle cramps severe    Simvastatin Other (See Comments)    Severe muscle cramps  Other reaction(s): Cramps (ALLERGY/intolerance) Severe muscle cramps Severe muscle cramps    Cephalosporins     Other reaction(s): Other, Other (See Comments)   Nickel Rash    Other  reaction(s): Other   Sulfa Antibiotics     hives Other reaction(s): Other hives   Sulfasalazine Hives    hives   Eliquis [Apixaban] Hives and Rash   Xarelto [Rivaroxaban] Hives and Rash    Medications Prior to Admission  Medication Sig Dispense Refill   Ascorbic Acid (VITAMIN C) 1000 MG tablet Take 1,000 mg by mouth daily.     aspirin EC 81 MG tablet Take 81 mg by mouth daily.     chlorthalidone (HYGROTON) 25 MG tablet Take 1 tablet (25 mg total) by mouth daily. 90 tablet 1   Cholecalciferol (VITAMIN D3) 125 MCG (5000 UT) CAPS Take 5,000 Units by mouth daily.     estradiol (VIVELLE-DOT) 0.05 MG/24HR patch Place 1 patch onto the skin 2 (two) times a week.     Evolocumab (REPATHA [place]) Inject into the skin.     ezetimibe (ZETIA) 10 MG tablet TAKE 1 TABLET BY MOUTH EVERY DAY 90 tablet 1   fluticasone (FLONASE) 50 MCG/ACT nasal spray Place 2 sprays into both nostrils daily.     irbesartan (AVAPRO) 300 MG tablet Take 300 mg by mouth daily.     Magnesium Oxide (MAG-OXIDE PO) Take 250 mg by mouth daily.     melatonin 5 MG TABS Take 5 mg by mouth at bedtime.     montelukast (  SINGULAIR) 10 MG tablet Take 10 mg by mouth daily.     Multiple Vitamin (MULTI-VITAMINS) TABS Take 1 tablet by mouth daily.      omeprazole (PRILOSEC) 20 MG capsule Take 20 mg by mouth daily.     predniSONE (DELTASONE) 5 MG tablet Take 5 mg by mouth daily with breakfast.     Semaglutide,0.25 or 0.5MG /DOS, (OZEMPIC, 0.25 OR 0.5 MG/DOSE,) 2 MG/1.5ML SOPN Inject into the skin.     sotalol (BETAPACE) 80 MG tablet Take 80 mg by mouth 2 (two) times daily.     venlafaxine XR (EFFEXOR-XR) 150 MG 24 hr capsule venlafaxine ER 150 mg capsule,extended release 24 hr  TAKE 1 CAPSULE BY MOUTH EVERY DAY     albuterol (PROAIR HFA) 108 (90 Base) MCG/ACT inhaler Inhale 2 puffs into the lungs every 6 (six) hours as needed. (Patient taking differently: Inhale 2 puffs into the lungs every 6 (six) hours as needed for wheezing or shortness of  breath.) 1 Inhaler 5   meclizine (ANTIVERT) 25 MG tablet Take 25 mg by mouth 3 (three) times daily as needed for dizziness.       Results for orders placed or performed during the hospital encounter of 11/14/21 (from the past 48 hour(s))  Glucose, capillary     Status: None   Collection Time: 11/14/21  6:57 AM  Result Value Ref Range   Glucose-Capillary 96 70 - 99 mg/dL    Comment: Glucose reference range applies only to samples taken after fasting for at least 8 hours.   No results found.  Review of Systems  All other systems reviewed and are negative.  Blood pressure (!) 175/63, pulse 72, temperature 97.7 F (36.5 C), temperature source Oral, resp. rate 16, height 5\' 4"  (1.626 m), weight 81.2 kg, SpO2 100 %. Physical Exam Constitutional:      Appearance: Normal appearance. She is normal weight.  HENT:     Head: Normocephalic and atraumatic.     Right Ear: External ear normal.     Left Ear: External ear normal.     Nose: Nose normal.     Mouth/Throat:     Mouth: Mucous membranes are moist.     Pharynx: Oropharynx is clear.  Eyes:     Extraocular Movements: Extraocular movements intact.     Conjunctiva/sclera: Conjunctivae normal.     Pupils: Pupils are equal, round, and reactive to light.  Cardiovascular:     Rate and Rhythm: Normal rate.  Pulmonary:     Effort: Pulmonary effort is normal.  Musculoskeletal:     Cervical back: Normal range of motion.  Skin:    General: Skin is warm and dry.  Neurological:     General: No focal deficit present.     Mental Status: She is alert and oriented to person, place, and time.  Psychiatric:        Mood and Affect: Mood normal.        Behavior: Behavior normal.        Thought Content: Thought content normal.        Judgment: Judgment normal.     Assessment/Plan Obstructive sleep apnea, BMI 30.73.  To OR for sleep endoscopy.  [name], MD 11/14/2021, 7:27 AM

## 2021-11-14 NOTE — Discharge Instructions (Signed)

## 2021-11-15 ENCOUNTER — Encounter (HOSPITAL_BASED_OUTPATIENT_CLINIC_OR_DEPARTMENT_OTHER): Payer: Self-pay | Admitting: Otolaryngology

## 2021-11-15 DIAGNOSIS — M9901 Segmental and somatic dysfunction of cervical region: Secondary | ICD-10-CM | POA: Diagnosis not present

## 2021-11-15 DIAGNOSIS — M47812 Spondylosis without myelopathy or radiculopathy, cervical region: Secondary | ICD-10-CM | POA: Diagnosis not present

## 2021-11-15 DIAGNOSIS — M9903 Segmental and somatic dysfunction of lumbar region: Secondary | ICD-10-CM | POA: Diagnosis not present

## 2021-11-15 DIAGNOSIS — M5442 Lumbago with sciatica, left side: Secondary | ICD-10-CM | POA: Diagnosis not present

## 2021-11-20 DIAGNOSIS — M9901 Segmental and somatic dysfunction of cervical region: Secondary | ICD-10-CM | POA: Diagnosis not present

## 2021-11-20 DIAGNOSIS — M9903 Segmental and somatic dysfunction of lumbar region: Secondary | ICD-10-CM | POA: Diagnosis not present

## 2021-11-20 DIAGNOSIS — M5442 Lumbago with sciatica, left side: Secondary | ICD-10-CM | POA: Diagnosis not present

## 2021-11-20 DIAGNOSIS — M47812 Spondylosis without myelopathy or radiculopathy, cervical region: Secondary | ICD-10-CM | POA: Diagnosis not present

## 2021-11-22 DIAGNOSIS — M5442 Lumbago with sciatica, left side: Secondary | ICD-10-CM | POA: Diagnosis not present

## 2021-11-22 DIAGNOSIS — M9903 Segmental and somatic dysfunction of lumbar region: Secondary | ICD-10-CM | POA: Diagnosis not present

## 2021-11-22 DIAGNOSIS — M47812 Spondylosis without myelopathy or radiculopathy, cervical region: Secondary | ICD-10-CM | POA: Diagnosis not present

## 2021-11-22 DIAGNOSIS — M9901 Segmental and somatic dysfunction of cervical region: Secondary | ICD-10-CM | POA: Diagnosis not present

## 2021-11-28 ENCOUNTER — Other Ambulatory Visit: Payer: Self-pay | Admitting: Otolaryngology

## 2021-11-28 DIAGNOSIS — M9901 Segmental and somatic dysfunction of cervical region: Secondary | ICD-10-CM | POA: Diagnosis not present

## 2021-11-28 DIAGNOSIS — M9903 Segmental and somatic dysfunction of lumbar region: Secondary | ICD-10-CM | POA: Diagnosis not present

## 2021-11-28 DIAGNOSIS — M47812 Spondylosis without myelopathy or radiculopathy, cervical region: Secondary | ICD-10-CM | POA: Diagnosis not present

## 2021-11-28 DIAGNOSIS — M5442 Lumbago with sciatica, left side: Secondary | ICD-10-CM | POA: Diagnosis not present

## 2021-12-05 DIAGNOSIS — M47812 Spondylosis without myelopathy or radiculopathy, cervical region: Secondary | ICD-10-CM | POA: Diagnosis not present

## 2021-12-05 DIAGNOSIS — M9901 Segmental and somatic dysfunction of cervical region: Secondary | ICD-10-CM | POA: Diagnosis not present

## 2021-12-05 DIAGNOSIS — M9903 Segmental and somatic dysfunction of lumbar region: Secondary | ICD-10-CM | POA: Diagnosis not present

## 2021-12-05 DIAGNOSIS — M5442 Lumbago with sciatica, left side: Secondary | ICD-10-CM | POA: Diagnosis not present

## 2021-12-06 ENCOUNTER — Encounter (HOSPITAL_BASED_OUTPATIENT_CLINIC_OR_DEPARTMENT_OTHER): Payer: Self-pay | Admitting: Otolaryngology

## 2021-12-06 ENCOUNTER — Other Ambulatory Visit: Payer: Self-pay

## 2021-12-07 DIAGNOSIS — E119 Type 2 diabetes mellitus without complications: Secondary | ICD-10-CM | POA: Diagnosis not present

## 2021-12-07 DIAGNOSIS — I480 Paroxysmal atrial fibrillation: Secondary | ICD-10-CM | POA: Diagnosis not present

## 2021-12-07 DIAGNOSIS — I10 Essential (primary) hypertension: Secondary | ICD-10-CM | POA: Diagnosis not present

## 2021-12-07 DIAGNOSIS — G4733 Obstructive sleep apnea (adult) (pediatric): Secondary | ICD-10-CM | POA: Diagnosis not present

## 2021-12-11 ENCOUNTER — Encounter (HOSPITAL_BASED_OUTPATIENT_CLINIC_OR_DEPARTMENT_OTHER)
Admission: RE | Admit: 2021-12-11 | Discharge: 2021-12-11 | Disposition: A | Discharge status: Home / Self Care | Payer: Medicare Other | Source: Ambulatory Visit | Attending: Otolaryngology | Admitting: Otolaryngology

## 2021-12-11 DIAGNOSIS — Z6830 Body mass index (BMI) 30.0-30.9, adult: Secondary | ICD-10-CM | POA: Diagnosis not present

## 2021-12-11 DIAGNOSIS — Z01812 Encounter for preprocedural laboratory examination: Secondary | ICD-10-CM | POA: Diagnosis not present

## 2021-12-11 DIAGNOSIS — E119 Type 2 diabetes mellitus without complications: Secondary | ICD-10-CM | POA: Diagnosis not present

## 2021-12-11 DIAGNOSIS — G4733 Obstructive sleep apnea (adult) (pediatric): Secondary | ICD-10-CM | POA: Diagnosis not present

## 2021-12-11 DIAGNOSIS — Z9989 Dependence on other enabling machines and devices: Secondary | ICD-10-CM | POA: Diagnosis not present

## 2021-12-11 LAB — BASIC METABOLIC PANEL
Anion gap: 10 (ref 5–15)
BUN: 18 mg/dL (ref 8–23)
CO2: 28 mmol/L (ref 22–32)
Calcium: 9 mg/dL (ref 8.9–10.3)
Chloride: 98 mmol/L (ref 98–111)
Creatinine, Ser: 0.98 mg/dL (ref 0.44–1.00)
GFR, Estimated: 59 mL/min — ABNORMAL LOW (ref >60)
Glucose, Bld: 109 mg/dL — ABNORMAL HIGH (ref 70–99)
Potassium: 3.8 mmol/L (ref 3.5–5.1)
Sodium: 136 mmol/L (ref 135–145)

## 2021-12-17 DIAGNOSIS — E119 Type 2 diabetes mellitus without complications: Secondary | ICD-10-CM | POA: Diagnosis not present

## 2021-12-17 DIAGNOSIS — E785 Hyperlipidemia, unspecified: Secondary | ICD-10-CM | POA: Diagnosis not present

## 2021-12-17 DIAGNOSIS — I10 Essential (primary) hypertension: Secondary | ICD-10-CM | POA: Diagnosis not present

## 2021-12-18 ENCOUNTER — Encounter (HOSPITAL_BASED_OUTPATIENT_CLINIC_OR_DEPARTMENT_OTHER)
Admission: RE | Disposition: A | Discharge status: Home / Self Care | Payer: Self-pay | Source: Home / Self Care | Attending: Otolaryngology

## 2021-12-18 ENCOUNTER — Ambulatory Visit (HOSPITAL_COMMUNITY): Payer: Medicare Other

## 2021-12-18 ENCOUNTER — Ambulatory Visit (HOSPITAL_BASED_OUTPATIENT_CLINIC_OR_DEPARTMENT_OTHER): Payer: Medicare Other | Admitting: Certified Registered"

## 2021-12-18 ENCOUNTER — Ambulatory Visit (HOSPITAL_BASED_OUTPATIENT_CLINIC_OR_DEPARTMENT_OTHER)
Admission: RE | Admit: 2021-12-18 | Discharge: 2021-12-18 | Disposition: A | Discharge status: Home / Self Care | Payer: Medicare Other | Attending: Otolaryngology | Admitting: Otolaryngology

## 2021-12-18 ENCOUNTER — Encounter (HOSPITAL_BASED_OUTPATIENT_CLINIC_OR_DEPARTMENT_OTHER): Payer: Self-pay | Admitting: Otolaryngology

## 2021-12-18 ENCOUNTER — Other Ambulatory Visit: Payer: Self-pay

## 2021-12-18 DIAGNOSIS — E119 Type 2 diabetes mellitus without complications: Secondary | ICD-10-CM

## 2021-12-18 DIAGNOSIS — K219 Gastro-esophageal reflux disease without esophagitis: Secondary | ICD-10-CM | POA: Diagnosis not present

## 2021-12-18 DIAGNOSIS — M353 Polymyalgia rheumatica: Secondary | ICD-10-CM | POA: Diagnosis not present

## 2021-12-18 DIAGNOSIS — I4891 Unspecified atrial fibrillation: Secondary | ICD-10-CM

## 2021-12-18 DIAGNOSIS — G4733 Obstructive sleep apnea (adult) (pediatric): Secondary | ICD-10-CM | POA: Diagnosis not present

## 2021-12-18 DIAGNOSIS — Z87891 Personal history of nicotine dependence: Secondary | ICD-10-CM | POA: Diagnosis not present

## 2021-12-18 DIAGNOSIS — J449 Chronic obstructive pulmonary disease, unspecified: Secondary | ICD-10-CM | POA: Insufficient documentation

## 2021-12-18 DIAGNOSIS — Z6830 Body mass index (BMI) 30.0-30.9, adult: Secondary | ICD-10-CM | POA: Insufficient documentation

## 2021-12-18 DIAGNOSIS — I10 Essential (primary) hypertension: Secondary | ICD-10-CM

## 2021-12-18 DIAGNOSIS — Z9989 Dependence on other enabling machines and devices: Secondary | ICD-10-CM | POA: Diagnosis not present

## 2021-12-18 DIAGNOSIS — E669 Obesity, unspecified: Secondary | ICD-10-CM | POA: Insufficient documentation

## 2021-12-18 DIAGNOSIS — Z794 Long term (current) use of insulin: Secondary | ICD-10-CM | POA: Insufficient documentation

## 2021-12-18 HISTORY — PX: IMPLANTATION OF HYPOGLOSSAL NERVE STIMULATOR: SHX6827

## 2021-12-18 LAB — GLUCOSE, CAPILLARY
Glucose-Capillary: 105 mg/dL — ABNORMAL HIGH (ref 70–99)
Glucose-Capillary: 136 mg/dL — ABNORMAL HIGH (ref 70–99)

## 2021-12-18 SURGERY — INSERTION, HYPOGLOSSAL NERVE STIMULATOR
Anesthesia: General | Site: Neck | Laterality: Right

## 2021-12-18 MED ORDER — ONDANSETRON HCL 4 MG/2ML IJ SOLN
INTRAMUSCULAR | Status: AC
  Filled 2021-12-18: qty 2

## 2021-12-18 MED ORDER — FENTANYL CITRATE (PF) 100 MCG/2ML IJ SOLN
INTRAMUSCULAR | Status: DC | PRN
  Administered 2021-12-18 (×4): 50 ug via INTRAVENOUS

## 2021-12-18 MED ORDER — LIDOCAINE 2% (20 MG/ML) 5 ML SYRINGE
INTRAMUSCULAR | Status: DC | PRN
  Administered 2021-12-18: 40 mg via INTRAVENOUS

## 2021-12-18 MED ORDER — DEXAMETHASONE SODIUM PHOSPHATE 4 MG/ML IJ SOLN
INTRAMUSCULAR | Status: DC | PRN
  Administered 2021-12-18: 5 mg via INTRAVENOUS

## 2021-12-18 MED ORDER — FENTANYL CITRATE (PF) 100 MCG/2ML IJ SOLN
INTRAMUSCULAR | Status: AC
  Filled 2021-12-18: qty 2

## 2021-12-18 MED ORDER — DEXAMETHASONE SODIUM PHOSPHATE 10 MG/ML IJ SOLN
INTRAMUSCULAR | Status: AC
  Filled 2021-12-18: qty 1

## 2021-12-18 MED ORDER — PROPOFOL 10 MG/ML IV BOLUS
INTRAVENOUS | Status: AC
  Filled 2021-12-18: qty 20

## 2021-12-18 MED ORDER — PROPOFOL 500 MG/50ML IV EMUL
INTRAVENOUS | Status: DC | PRN
  Administered 2021-12-18: 25 ug/kg/min via INTRAVENOUS

## 2021-12-18 MED ORDER — VANCOMYCIN HCL IN DEXTROSE 1-5 GM/200ML-% IV SOLN
INTRAVENOUS | Status: AC
  Filled 2021-12-18: qty 200

## 2021-12-18 MED ORDER — SUCCINYLCHOLINE CHLORIDE 200 MG/10ML IV SOSY
PREFILLED_SYRINGE | INTRAVENOUS | Status: AC
  Filled 2021-12-18: qty 10

## 2021-12-18 MED ORDER — OXYCODONE HCL 5 MG/5ML PO SOLN: 5.0000 mg | Freq: Once | ORAL | Status: AC | PRN

## 2021-12-18 MED ORDER — PROPOFOL 500 MG/50ML IV EMUL
INTRAVENOUS | Status: AC
  Filled 2021-12-18: qty 50

## 2021-12-18 MED ORDER — SUCCINYLCHOLINE CHLORIDE 200 MG/10ML IV SOSY
PREFILLED_SYRINGE | INTRAVENOUS | Status: DC | PRN
  Administered 2021-12-18: 120 mg via INTRAVENOUS

## 2021-12-18 MED ORDER — FENTANYL CITRATE (PF) 100 MCG/2ML IJ SOLN
INTRAMUSCULAR | Status: AC
Start: 2021-12-18 — End: ?
  Filled 2021-12-18: qty 2

## 2021-12-18 MED ORDER — FENTANYL CITRATE (PF) 100 MCG/2ML IJ SOLN
25.0000 ug | INTRAMUSCULAR | Status: DC | PRN
  Administered 2021-12-18 (×2): 25 ug via INTRAVENOUS

## 2021-12-18 MED ORDER — HYDROCODONE-ACETAMINOPHEN 7.5-325 MG PO TABS: 1.0000 | ORAL_TABLET | Freq: Four times a day (QID) | ORAL | 0 refills | Status: DC | PRN

## 2021-12-18 MED ORDER — EPHEDRINE SULFATE (PRESSORS) 50 MG/ML IJ SOLN
INTRAMUSCULAR | Status: DC | PRN
  Administered 2021-12-18 (×2): 10 mg via INTRAVENOUS

## 2021-12-18 MED ORDER — VANCOMYCIN HCL IN DEXTROSE 1-5 GM/200ML-% IV SOLN
1000.0000 mg | INTRAVENOUS | Status: AC
  Administered 2021-12-18: 1000 mg via INTRAVENOUS

## 2021-12-18 MED ORDER — OXYCODONE HCL 5 MG PO TABS
5.0000 mg | ORAL_TABLET | Freq: Once | ORAL | Status: AC | PRN
  Administered 2021-12-18: 5 mg via ORAL

## 2021-12-18 MED ORDER — LIDOCAINE-EPINEPHRINE 1 %-1:100000 IJ SOLN
INTRAMUSCULAR | Status: AC
  Filled 2021-12-18: qty 1

## 2021-12-18 MED ORDER — MIDAZOLAM HCL 2 MG/2ML IJ SOLN: 0.5000 mg | Freq: Once | INTRAMUSCULAR | Status: DC | PRN

## 2021-12-18 MED ORDER — LIDOCAINE 2% (20 MG/ML) 5 ML SYRINGE
INTRAMUSCULAR | Status: AC
  Filled 2021-12-18: qty 5

## 2021-12-18 MED ORDER — PROPOFOL 10 MG/ML IV BOLUS
INTRAVENOUS | Status: DC | PRN
Start: 2021-12-18 — End: 2021-12-18
  Administered 2021-12-18: 120 mg via INTRAVENOUS
  Administered 2021-12-18: 30 mg via INTRAVENOUS

## 2021-12-18 MED ORDER — MEPERIDINE HCL 25 MG/ML IJ SOLN: 6.2500 mg | INTRAMUSCULAR | Status: DC | PRN

## 2021-12-18 MED ORDER — LIDOCAINE-EPINEPHRINE 1 %-1:100000 IJ SOLN
INTRAMUSCULAR | Status: DC | PRN
  Administered 2021-12-18: 4.5 mL

## 2021-12-18 MED ORDER — ONDANSETRON HCL 4 MG/2ML IJ SOLN
INTRAMUSCULAR | Status: DC | PRN
  Administered 2021-12-18: 4 mg via INTRAVENOUS

## 2021-12-18 MED ORDER — OXYCODONE HCL 5 MG PO TABS
ORAL_TABLET | ORAL | Status: AC
  Filled 2021-12-18: qty 1

## 2021-12-18 MED ORDER — 0.9 % SODIUM CHLORIDE (POUR BTL) OPTIME
TOPICAL | Status: DC | PRN
Start: 2021-12-18 — End: 2021-12-18
  Administered 2021-12-18: 200 mL

## 2021-12-18 MED ORDER — LACTATED RINGERS IV SOLN: INTRAVENOUS | Status: DC

## 2021-12-18 SURGICAL SUPPLY — 65 items
BLADE CLIPPER SURG (BLADE) IMPLANT
BLADE SURG 15 STRL LF DISP TIS (BLADE) ×1 IMPLANT
BLADE SURG 15 STRL SS (BLADE) ×2
CANISTER SUCT 1200ML W/VALVE (MISCELLANEOUS) ×2 IMPLANT
CORD BIPOLAR FORCEPS 12FT (ELECTRODE) ×2 IMPLANT
COVER PROBE W GEL 5X96 (DRAPES) ×2 IMPLANT
DERMABOND ADVANCED (GAUZE/BANDAGES/DRESSINGS) ×2
DERMABOND ADVANCED .7 DNX12 (GAUZE/BANDAGES/DRESSINGS) ×2 IMPLANT
DRAPE C-ARM 35X43 STRL (DRAPES) ×2 IMPLANT
DRAPE HEAD BAR (DRAPES) IMPLANT
DRAPE INCISE IOBAN 66X45 STRL (DRAPES) ×2 IMPLANT
DRAPE MICROSCOPE WILD 40.5X102 (DRAPES) ×2 IMPLANT
DRAPE UTILITY XL STRL (DRAPES) ×2 IMPLANT
DRSG TEGADERM 2-3/8X2-3/4 SM (GAUZE/BANDAGES/DRESSINGS) ×4 IMPLANT
DRSG TEGADERM 4X4.75 (GAUZE/BANDAGES/DRESSINGS) IMPLANT
ELECT COATED BLADE 2.86 ST (ELECTRODE) ×2 IMPLANT
ELECT EMG 18 NIMS (NEUROSURGERY SUPPLIES) ×2
ELECT REM PT RETURN 9FT ADLT (ELECTROSURGICAL) ×2
ELECTRODE EMG 18 NIMS (NEUROSURGERY SUPPLIES) ×1 IMPLANT
ELECTRODE REM PT RTRN 9FT ADLT (ELECTROSURGICAL) ×1 IMPLANT
FORCEPS BIPOLAR SPETZLER 8 1.0 (NEUROSURGERY SUPPLIES) ×2 IMPLANT
GAUZE 4X4 16PLY ~~LOC~~+RFID DBL (SPONGE) ×3 IMPLANT
GAUZE SPONGE 4X4 12PLY STRL (GAUZE/BANDAGES/DRESSINGS) ×2 IMPLANT
GENERATOR PULSE INSPIRE (Generator) ×2 IMPLANT
GENERATOR PULSE INSPIRE IV (Generator) ×1 IMPLANT
GLOVE SURG ENC MOIS LTX SZ6.5 (GLOVE) IMPLANT
GLOVE SURG ENC MOIS LTX SZ7.5 (GLOVE) ×2 IMPLANT
GLOVE SURG POLYISO LF SZ6.5 (GLOVE) ×1 IMPLANT
GLOVE SURG UNDER POLY LF SZ7 (GLOVE) ×2 IMPLANT
GOWN STRL REUS W/ TWL LRG LVL3 (GOWN DISPOSABLE) ×3 IMPLANT
GOWN STRL REUS W/TWL LRG LVL3 (GOWN DISPOSABLE) ×6
IV CATH 18G SAFETY (IV SOLUTION) ×2 IMPLANT
KIT NEURO ACCESSORY W/WRENCH (MISCELLANEOUS) IMPLANT
LEAD SENSING RESP INSPIRE (Lead) ×2 IMPLANT
LEAD SENSING RESP INSPIRE IV (Lead) ×1 IMPLANT
LEAD SLEEP STIM INSPIRE IV/V (Lead) ×1 IMPLANT
LEAD SLEEP STIMULATION INSPIRE (Lead) ×2 IMPLANT
LOOP VESSEL MAXI BLUE (MISCELLANEOUS) ×2 IMPLANT
LOOP VESSEL MINI RED (MISCELLANEOUS) ×2 IMPLANT
MARKER SKIN DUAL TIP RULER LAB (MISCELLANEOUS) ×2 IMPLANT
NDL HYPO 25X1 1.5 SAFETY (NEEDLE) ×1 IMPLANT
NEEDLE HYPO 25X1 1.5 SAFETY (NEEDLE) ×2 IMPLANT
NS IRRIG 1000ML POUR BTL (IV SOLUTION) ×2 IMPLANT
PACK BASIN DAY SURGERY FS (CUSTOM PROCEDURE TRAY) ×2 IMPLANT
PACK ENT DAY SURGERY (CUSTOM PROCEDURE TRAY) ×2 IMPLANT
PASSER CATH 36 CODMAN DISP (NEUROSURGERY SUPPLIES) ×1 IMPLANT
PASSER CATH 38CM DISP (INSTRUMENTS) IMPLANT
PENCIL SMOKE EVACUATOR (MISCELLANEOUS) ×2 IMPLANT
PROBE NERVE STIMULATOR (NEUROSURGERY SUPPLIES) ×2 IMPLANT
REMOTE CONTROL SLEEP INSPIRE (MISCELLANEOUS) ×2 IMPLANT
SET WALTER ACTIVATION W/DRAPE (SET/KITS/TRAYS/PACK) ×2 IMPLANT
SLEEVE SCD COMPRESS KNEE MED (STOCKING) ×2 IMPLANT
SPONGE INTESTINAL PEANUT (DISPOSABLE) ×2 IMPLANT
SUT SILK 2 0 SH (SUTURE) ×2 IMPLANT
SUT SILK 3 0 REEL (SUTURE) ×2 IMPLANT
SUT SILK 3 0 SH 30 (SUTURE) ×2 IMPLANT
SUT SILK 3-0 (SUTURE) ×4
SUT SILK 3-0 RB1 30XBRD (SUTURE) ×2
SUT VIC AB 3-0 SH 27 (SUTURE) ×4
SUT VIC AB 3-0 SH 27X BRD (SUTURE) ×1 IMPLANT
SUT VIC AB 4-0 PS2 27 (SUTURE) ×3 IMPLANT
SUTURE SILK 3-0 RB1 30XBRD (SUTURE) ×2 IMPLANT
SYR 10ML LL (SYRINGE) ×2 IMPLANT
SYR BULB EAR ULCER 3OZ GRN STR (SYRINGE) ×2 IMPLANT
TOWEL GREEN STERILE FF (TOWEL DISPOSABLE) ×4 IMPLANT

## 2021-12-18 NOTE — Anesthesia Preprocedure Evaluation (Addendum)
Anesthesia Evaluation  ?Patient identified by MRN, date of birth, ID band ?Patient awake ? ? ? ?Reviewed: ?Allergy & Precautions, NPO status , Patient's Chart, lab work & pertinent test results, reviewed documented beta blocker date and time  ? ?History of Anesthesia Complications ?Negative for: history of anesthetic complications ? ?Airway ?Mallampati: II ? ?TM Distance: >3 FB ?Neck ROM: Full ? ? ? Dental ? ?(+) Dental Advisory Given ?  ?Pulmonary ?asthma , sleep apnea and Continuous Positive Airway Pressure Ventilation , COPD,  COPD inhaler, former smoker,  ?  ?breath sounds clear to auscultation ? ? ? ? ? ? Cardiovascular ?hypertension, Pt. on medications and Pt. on home beta blockers ?(-) angina+ dysrhythmias Atrial Fibrillation  ?Rhythm:Regular Rate:Normal ? ? ?  ?Neuro/Psych ?negative neurological ROS ?   ? GI/Hepatic ?GERD  Medicated and Controlled,  ?Endo/Other  ?diabetes (glu 105), Insulin Dependent ? Renal/GU ?negative Renal ROS  ? ?  ?Musculoskeletal ?polymyalgia  ? Abdominal ?(+) + obese,   ?Peds ? Hematology ?negative hematology ROS ?(+)   ?Anesthesia Other Findings ? ? Reproductive/Obstetrics ? ?  ? ? ? ? ? ? ? ? ? ? ? ? ? ?  ?  ? ? ? ? ? ? ? ?Anesthesia Physical ?Anesthesia Plan ? ?ASA: 3 ? ?Anesthesia Plan: General  ? ?Post-op Pain Management: Tylenol PO (pre-op)*  ? ?Induction: Intravenous ? ?PONV Risk Score and Plan: 3 and Ondansetron, Dexamethasone and Treatment may vary due to age or medical condition ? ?Airway Management Planned: Oral ETT ? ?Additional Equipment: None ? ?Intra-op Plan:  ? ?Post-operative Plan: Extubation in OR ? ?Informed Consent: I have reviewed the patients History and Physical, chart, labs and discussed the procedure including the risks, benefits and alternatives for the proposed anesthesia with the patient or authorized representative who has indicated his/her understanding and acceptance.  ? ? ? ?Dental advisory given ? ?Plan Discussed  with: CRNA and Surgeon ? ?Anesthesia Plan Comments:   ? ? ? ? ? ? ?Anesthesia Quick Evaluation ? ?

## 2021-12-18 NOTE — Anesthesia Postprocedure Evaluation (Signed)
Anesthesia Post Note ? ?Patient: [name] [name] ? ?Procedure(s) Performed: IMPLANTATION OF HYPOGLOSSAL NERVE STIMULATOR (Right: Neck) ? ?  ? ?Patient location during evaluation: Phase II ?Anesthesia Type: General ?Level of consciousness: awake and alert, oriented and patient cooperative ?Pain management: pain level controlled ?Vital Signs Assessment: post-procedure vital signs reviewed and stable ?Respiratory status: spontaneous breathing, nonlabored ventilation and respiratory function stable ?Cardiovascular status: blood pressure returned to baseline and stable ?Postop Assessment: no apparent nausea or vomiting, able to ambulate and adequate PO intake ?Anesthetic complications: no ? ? ?No notable events documented. ? ?Last Vitals:  ?Vitals:  ? 12/18/21 1700 12/18/21 1721  ?BP: (!) 157/72 (!) 163/59  ?Pulse: 65 65  ?Resp: 14 18  ?Temp:  36.9 ?C  ?SpO2: 91% 94%  ?  ?Last Pain:  ?Vitals:  ? 12/18/21 1721  ?TempSrc:   ?PainSc: 3   ? ? ?  ?  ?  ?  ?  ?  ? ?Thoms Barthelemy,E. Unnamed Hino ? ? ? ? ?

## 2021-12-18 NOTE — Brief Op Note (Signed)
12/18/2021 ? ?3:56 PM ? ?PATIENT:  [name] [name]  76 y.o. female ? ?PRE-OPERATIVE DIAGNOSIS:  OBSTRUCTIVE SLEEP APNEA ? ?POST-OPERATIVE DIAGNOSIS:  OBSTRUCTIVE SLEEP APNEA ? ?PROCEDURE:  Procedure(s): ?IMPLANTATION OF HYPOGLOSSAL NERVE STIMULATOR (Right) ? ?SURGEON:  Surgeon(s) and Role: ?   [name], MD - Primary ? ?PHYSICIAN ASSISTANT:  ? ?ASSISTANTS: RNFA  ? ?ANESTHESIA:   general ? ?EBL:  20 mL  ? ?BLOOD ADMINISTERED:none ? ?DRAINS: none  ? ?LOCAL MEDICATIONS USED:  LIDOCAINE  ? ?SPECIMEN:  No Specimen ? ?DISPOSITION OF SPECIMEN:  N/A ? ?COUNTS:  YES ? ?TOURNIQUET:  * No tourniquets in log * ? ?DICTATION: .Note written in EPIC ? ?PLAN OF CARE: Discharge to home after PACU ? ?PATIENT DISPOSITION:  PACU - hemodynamically stable. ?  ?Delay start of Pharmacological VTE agent (>24hrs) due to surgical blood loss or risk of bleeding: no ? ?

## 2021-12-18 NOTE — Discharge Instructions (Signed)

## 2021-12-18 NOTE — Anesthesia Procedure Notes (Signed)
Procedure Name: Intubation ?Date/Time: 12/18/2021 1:50 PM ?Performed by: Maryella Shivers, CRNA ?Pre-anesthesia Checklist: Patient identified, Emergency Drugs available, Suction available and Patient being monitored ?Patient Re-evaluated:Patient Re-evaluated prior to induction ?Oxygen Delivery Method: Circle system utilized ?Preoxygenation: Pre-oxygenation with 100% oxygen ?Induction Type: IV induction ?Ventilation: Mask ventilation without difficulty ?Laryngoscope Size: Mac and 3 ?Grade View: Grade II ?Tube type: Oral ?Tube size: 7.0 mm ?Number of attempts: 1 ?Airway Equipment and Method: Stylet and Oral airway ?Placement Confirmation: ETT inserted through vocal cords under direct vision, positive ETCO2 and breath sounds checked- equal and bilateral ?Secured at: 22 cm ?Tube secured with: Tape ?Dental Injury: Teeth and Oropharynx as per pre-operative assessment  ? ? ? ? ?

## 2021-12-18 NOTE — H&P (Signed)
[name] [name] is an 76 y.o. female.   ?Chief Complaint: Sleep apnea ?HPI: 76 year old female with obstructive sleep apnea who has been unable to tolerate CPAP. ? ?Past Medical History:  ?Diagnosis Date  ? Asthma 1976  ? Atrial fibrillation ([place])   ? Bronchitis   ? FUO (fever of unknown origin) 03/03/2015  ? GERD (gastroesophageal reflux disease)   ? Hepatitis 1980s  ? Hepatitis   ? HTN (hypertension)   ? Hypercholesterolemia   ? Night sweat 03/03/2015  ? PMR (polymyalgia rheumatica) ([place]) 03/15/2015  ? 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 Polymyalgia ([place]) 03/03/2015  ? Polymyositis ([place]) 03/03/2015  ? Sleep apnea   ? Type 2 diabetes mellitus ([place])   ? ? ?Past Surgical History:  ?Procedure Laterality Date  ? ABDOMINAL HYSTERECTOMY    ? APPENDECTOMY  09/24/1971  ? DG THUMB RIGHT HAND ([place] HX)    ? DRUG INDUCED ENDOSCOPY N/A 11/14/2021  ? Procedure: DRUG INDUCED SLEEP ENDOSCOPY;  Surgeon: [name], MD;  Location: [facility];  Service: ENT;  Laterality: N/A;  ? FOOT SURGERY Left 09/24/2007  ? KNEE ARTHROPLASTY  09/23/2005  ? right  ? OVARY SURGERY  09/24/1999  ? TUBAL LIGATION  09/23/1970  ? VAGINAL HYSTERECTOMY  09/24/1971  ? ? ?Family History  ?Problem Relation Age of Onset  ? Hypertension Sister   ? Lung cancer Father   ? Hypertension Mother   ? Multiple sclerosis Daughter   ? Breast cancer Other   ?     maternal aunt  ? Allergies Daughter   ? Allergies Daughter   ? Allergies Son   ? ?Social History:  reports that she quit smoking about 41 years ago. Her smoking use included cigarettes. She has a 1.00 pack-year smoking history. She has never used smokeless tobacco. She reports current alcohol use. She reports that she does not use drugs. ? ?Allergies:  ?Allergies  ?Allergen Reactions  ? Atorvastatin Other (See Comments)  ?  Muscle cramps severe ? ? ?  ? Simvastatin Other (See Comments)  ?  Severe muscle cramps  ? Cephalosporins   ?  'ran a fever"  ? Nickel Rash  ? Sulfa Antibiotics Hives  ?  hives  ?  Sulfasalazine Hives  ?  hives  ? Eliquis [Apixaban] Hives and Itching  ? Xarelto [Rivaroxaban] Hives and Itching  ? ? ?Medications Prior to Admission  ?Medication Sig Dispense Refill  ? Ascorbic Acid (VITAMIN C) 1000 MG tablet Take 1,000 mg by mouth daily.    ? chlorthalidone (HYGROTON) 25 MG tablet Take 1 tablet (25 mg total) by mouth daily. 90 tablet 1  ? Cholecalciferol (VITAMIN D3) 125 MCG (5000 UT) CAPS Take 5,000 Units by mouth daily.    ? estradiol (VIVELLE-DOT) 0.05 MG/24HR patch Place 1 patch onto the skin 2 (two) times a week.    ? Evolocumab (REPATHA [place]) Inject into the skin. Once Q 2 weeks    ? ezetimibe (ZETIA) 10 MG tablet TAKE 1 TABLET BY MOUTH EVERY DAY 90 tablet 1  ? irbesartan (AVAPRO) 300 MG tablet Take 300 mg by mouth daily.    ? Magnesium Oxide (MAG-OXIDE PO) Take 250 mg by mouth daily.    ? melatonin 5 MG TABS Take 5 mg by mouth at bedtime.    ? montelukast (SINGULAIR) 10 MG tablet Take 10 mg by mouth daily.    ? Multiple Vitamin (MULTI-VITAMINS) TABS Take 1 tablet by mouth daily.     ? omeprazole (PRILOSEC)  20 MG capsule Take 20 mg by mouth daily.    ? predniSONE (DELTASONE) 5 MG tablet Take 5 mg by mouth daily with breakfast.    ? Semaglutide,0.25 or 0.'5MG'$ /DOS, (OZEMPIC, 0.25 OR 0.5 MG/DOSE,) 2 MG/1.5ML SOPN Inject into the skin once a week.    ? sotalol (BETAPACE) 80 MG tablet Take 80 mg by mouth 2 (two) times daily.    ? venlafaxine XR (EFFEXOR-XR) 150 MG 24 hr capsule venlafaxine ER 150 mg capsule,extended release 24 hr ? TAKE 1 CAPSULE BY MOUTH EVERY DAY    ? albuterol (PROAIR HFA) 108 (90 Base) MCG/ACT inhaler Inhale 2 puffs into the lungs every 6 (six) hours as needed. (Patient taking differently: Inhale 2 puffs into the lungs every 6 (six) hours as needed for wheezing or shortness of breath. Has not taken in "years") 1 Inhaler 5  ? aspirin EC 81 MG tablet Take 81 mg by mouth daily.    ? fluticasone (FLONASE) 50 MCG/ACT nasal spray Place 2 sprays into both nostrils daily.    ? meclizine  (ANTIVERT) 25 MG tablet Take 25 mg by mouth 3 (three) times daily as needed for dizziness.     ? ? ?Results for orders placed or performed during the hospital encounter of 12/18/21 (from the past 48 hour(s))  ?Glucose, capillary     Status: Abnormal  ? Collection Time: 12/18/21 12:11 PM  ?Result Value Ref Range  ? Glucose-Capillary 105 (H) 70 - 99 mg/dL  ?  Comment: Glucose reference range applies only to samples taken after fasting for at least 8 hours.  ? ?No results found. ? ?Review of Systems  ?All other systems reviewed and are negative. ? ?Blood pressure (!) 151/61, pulse 68, temperature (!) 97.3 ?F (36.3 ?C), temperature source Oral, resp. rate 16, height '5\' 4"'$  (1.626 m), weight 80.2 kg, SpO2 97 %. ?Physical Exam ?Constitutional:   ?   Appearance: Normal appearance. She is normal weight.  ?HENT:  ?   Head: Normocephalic and atraumatic.  ?   Right Ear: External ear normal.  ?   Left Ear: External ear normal.  ?   Nose: Nose normal.  ?   Mouth/Throat:  ?   Mouth: Mucous membranes are moist.  ?   Pharynx: Oropharynx is clear.  ?Eyes:  ?   Extraocular Movements: Extraocular movements intact.  ?   Conjunctiva/sclera: Conjunctivae normal.  ?   Pupils: Pupils are equal, round, and reactive to light.  ?Cardiovascular:  ?   Rate and Rhythm: Normal rate.  ?Pulmonary:  ?   Effort: Pulmonary effort is normal.  ?Musculoskeletal:  ?   Cervical back: Normal range of motion.  ?Skin: ?   General: Skin is warm and dry.  ?Neurological:  ?   General: No focal deficit present.  ?   Mental Status: She is alert and oriented to person, place, and time.  ?Psychiatric:     ?   Mood and Affect: Mood normal.     ?   Behavior: Behavior normal.     ?   Thought Content: Thought content normal.     ?   Judgment: Judgment normal.  ?  ? ?Assessment/Plan ?Obstructive sleep apnea, BMI 30.35. ? ?To OR for hypoglossal nerve stimulator placement. ? ?[name], MD ?12/18/2021, 1:37 PM ? ? ? ?

## 2021-12-18 NOTE — Transfer of Care (Signed)
Immediate Anesthesia Transfer of Care Note ? ?Patient: [name] [name] ? ?Procedure(s) Performed: IMPLANTATION OF HYPOGLOSSAL NERVE STIMULATOR (Right: Neck) ? ?Patient Location: PACU ? ?Anesthesia Type:General ? ?Level of Consciousness: drowsy and patient cooperative ? ?Airway & Oxygen Therapy: Patient Spontanous Breathing and Patient connected to face mask oxygen ? ?Post-op Assessment: Report given to RN and Post -op Vital signs reviewed and stable ? ?Post vital signs: Reviewed and stable ? ?Last Vitals:  ?Vitals Value Taken Time  ?BP 91/81 12/18/21 1611  ?Temp    ?Pulse 81 12/18/21 1611  ?Resp 17 12/18/21 1611  ?SpO2 96 % 12/18/21 1611  ? ? ?Last Pain:  ?Vitals:  ? 12/18/21 1208  ?TempSrc: Oral  ?PainSc: 0-No pain  ?   ? ?Patients Stated Pain Goal: 2 (12/18/21 1208) ? ?Complications: No notable events documented. ?

## 2021-12-18 NOTE — Op Note (Signed)
PREOPERATIVE DIAGNOSIS:  Obstructive sleep apnea. ?  ?POSTOPERATIVE DIAGNOSIS:  Obstructive sleep apnea. ?  ?PROCEDURE:  Placement of hypoglossal nerve stimulator including placement of sensor lead and testing of stimulator. ?  ?SURGEON:  [name], MD ?  ?ASSISTANT:  RNFA ?  ?ANESTHESIA:  General endotracheal anesthesia. ?  ?COMPLICATIONS:  None. ?  ?INDICATIONS:  The patient is a 76 year old female with a history of obstructive sleep apnea who has not been able to tolerate CPAP.  She presents to the operating room for placement of hypoglossal nerve stimulator. ?  ?FINDINGS:  Surgical anatomy was unremarkable.  The device was tested intraoperatively and demonstrated excellent stimulation and sensor lead function. ?  ?DESCRIPTION OF PROCEDURE:  The patient was identified in the holding room, informed consent having been obtained, including discussion of risks, benefits and alternatives, the patient was brought to the operative suite and put on the operative table in  ?supine position.  Anesthesia was induced and the patient was intubated by the anesthesia team without difficulty.  The patient was given intravenous antibiotics during the case.  The eyes were taped closed and the bed was turned 180 degrees from  ?anesthesia and a shoulder roll was placed.  The right neck and right chest incisions were marked with a marking pen after measuring and injected with 1% lidocaine with 1:100,000 epinephrine.  The nerve integrity monitor was placed in the right lateral  ?tongue and floor of mouth and turned on during the case.  The right neck and chest were prepped and draped in sterile fashion.  The neck incision was made with a 15 blade scalpel and extended through subcutaneous tissue to the platysmal layer using Bovie ? electrocautery.  Dissection was then extended down the platysma layer somewhat until it was divided more inferiorly.  This allowed direct dissection down onto the lower portion of the submandibular gland  and ultimately the digastric tendon.  The tendon  ?was retracted inferiorly with two vessel loops.  Further dissection along the digastric exposed the mylohyoid muscle, which was then retracted anteriorly exposing the hypoglossal nerve.  The fascia over the nerve was then divided using bipolar  ?electrocautery to control bleeding.  The various branches of the nerve were then identified including the C1 branch and the more distal branches off of the main trunk.  Nerve stimulator was then used to identify which branches would be included as  ?protrusion branches and excluded as retrusion branches.  The break point between that on the superior surface of the nerve was then identified and the inclusion portion of the nerve was then elevated allowing pocket for cuff placement.  The cuff was then ? brought into the field and placed around the inclusion branches and properly positioned.  Saline was then injected under the cuff.  The anchor for this lead was then sutured to the digastric tendon using 3-0 silk suture in 2 positions.  The stimulating lead was  ?then fully placed in the neck and covered with a damp gauze.  The infraclavicular incision was made using a 15 blade scalpel and extended through the subcutaneous tissues using Bovie electrocautery down to the pectoralis fascia.  A pocket was then  ?created inferiorly for the generator.  2 stay sutures were then placed at the superior lateral extent of the wound using 2-0 silk suture and these were left in place.  Dissection was then extended through  ?the pectoralis major muscle overlying the second intercostal space.  Fatty tissue deep to the muscle was  swept exposing the external intercostal muscle.  This muscle was dissected more medially exposing the internal intercostal muscle.  A retractor was then gently placed between those muscles running posteriorly and the sensor lead was then placed into that pocket easily.  The anchor was then sutured with 3-0 silk suture  in 3 positions.  The other anchor was then secured on the pectoralis muscle using 3-0 silk in 2 positions.  The neck incision was then uncovered and the lead unraveled.  The tunneling pocket was started with a hemostat under the platysma muscle and extended with the tunneling down over the clavicle to the generator pocket and the stimulating lead was then pulled into  ?the generator pocket with a little bit of slack left in the neck.  Both leads were then cleaned off with damp gauze and dried.  Each one was placed into the generator tightening down the screws to 2 clicks on both occasions.  Both leads were tugged and  ?found to be in good position.  The generator was then positioned into the generator pocket and testing then commenced.  After testing demonstrated good stimulation and good sensor lead function, each wound was copiously irrigated with saline.  The chest wound was closed in the deep tissues with 3-0 Vicryl suture in simple interrupted fashion and in the subcutaneous layer using 4-0 Vicryl suture in a simple interrupted fashion.  The neck incision was closed in the platysma layer with 3-0 Vicryl suture in a  ?simple interrupted fashion and then in the subcutaneous layer using 4-0 Vicryl suture in a simple interrupted fashion.  Both incisions were covered with Dermabond.  The patient was then cleaned off and drapes were removed.  The nerve integrity monitor  ?was removed.  Each incision was then covered with gauze pressure dressing.  The patient was then returned to anesthesia for wakeup and was extubated in the recovery room in stable condition. ? ?

## 2021-12-19 ENCOUNTER — Encounter (HOSPITAL_BASED_OUTPATIENT_CLINIC_OR_DEPARTMENT_OTHER): Payer: Self-pay | Admitting: Otolaryngology

## 2022-01-08 DIAGNOSIS — H5213 Myopia, bilateral: Secondary | ICD-10-CM | POA: Diagnosis not present

## 2022-01-09 DIAGNOSIS — M5442 Lumbago with sciatica, left side: Secondary | ICD-10-CM | POA: Diagnosis not present

## 2022-01-09 DIAGNOSIS — M9901 Segmental and somatic dysfunction of cervical region: Secondary | ICD-10-CM | POA: Diagnosis not present

## 2022-01-09 DIAGNOSIS — M47812 Spondylosis without myelopathy or radiculopathy, cervical region: Secondary | ICD-10-CM | POA: Diagnosis not present

## 2022-01-09 DIAGNOSIS — M9903 Segmental and somatic dysfunction of lumbar region: Secondary | ICD-10-CM | POA: Diagnosis not present

## 2022-01-31 ENCOUNTER — Other Ambulatory Visit: Payer: Self-pay | Admitting: Pulmonary Disease

## 2022-01-31 ENCOUNTER — Encounter: Payer: Self-pay | Admitting: Pulmonary Disease

## 2022-01-31 ENCOUNTER — Ambulatory Visit: Payer: Medicare Other | Admitting: Pulmonary Disease

## 2022-01-31 DIAGNOSIS — G4733 Obstructive sleep apnea (adult) (pediatric): Secondary | ICD-10-CM

## 2022-01-31 NOTE — Progress Notes (Signed)
? ?  Subjective:  ? ? Patient ID: [name] [name], female    DOB: [birth year], 76 y.o.   MRN: [id number] ? ?HPI ? ? 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  ?PMH-   paroxysmal atrial fibrillation - on sotalol 80 mg twice daily and Xarelto. Polymyalgia rheumatica was diagnosed in 2016, good response to steroids ? ?She was intolerant of CPAP and underwent placement of hypoglossal nerve stimulator implant on 12/18/2021 .  Follow-up visit with ENT was reviewed ?She presents for activation today.  Reports nonrefreshing sleep and snoring noted by her husband.  No pain around incision sites ? ?Significant tests/ events reviewed ? ?HST 05/2017 AHI 32/h ? ? ?Review of Systems ?neg for any significant sore throat, dysphagia, itching, sneezing, nasal congestion or excess/ purulent secretions, fever, chills, sweats, unintended wt loss, pleuritic or exertional cp, hempoptysis, orthopnea pnd or change in chronic leg swelling. Also denies presyncope, palpitations, heartburn, abdominal pain, nausea, vomiting, diarrhea or change in bowel or urinary habits, dysuria,hematuria, rash, arthralgias, visual complaints, headache, numbness weakness or ataxia. ? ?   ?Objective:  ? Physical Exam ? ?Gen. Pleasant, obese, in no distress ?ENT - no lesions, no post nasal drip ?Neck: No JVD, no thyromegaly, no carotid bruits ?Lungs: no use of accessory muscles, no dullness to percussion, decreased without rales or rhonchi  ?Cardiovascular: Rhythm regular, heart sounds  normal, no murmurs or gallops, no peripheral edema ?Musculoskeletal: No deformities, no cyanosis or clubbing , no tremors ? ?Skin -incision site on chest and neck appears clean and have healed well ? ? ?   ?Assessment & Plan:  ? ? ?

## 2022-01-31 NOTE — Patient Instructions (Signed)
Activation done today ?

## 2022-01-31 NOTE — Assessment & Plan Note (Signed)
Her incision sites have healed well.  We activated the inspire implant today.  Sensation was at 0.5 V. ?Level 1 was set at 0.7 V and level 10 was at 1.7 V.  She will increase every week by 0.1 V until she develops discomfort. ?She was educated about use of remote. ?Remote was set with a 30-minute ramp, 8 hours sleep time and a 15-minute pause time ?She was provided with a one 800-number for device related issues.  She can call us as needed ? ?We will reassess in 4 weeks.  Plan would be to proceed with sleep study after maximum activation at 2 to 3 months ?

## 2022-02-07 ENCOUNTER — Ambulatory Visit: Payer: No Typology Code available for payment source | Admitting: Physician Assistant

## 2022-02-13 DIAGNOSIS — M5442 Lumbago with sciatica, left side: Secondary | ICD-10-CM | POA: Diagnosis not present

## 2022-02-13 DIAGNOSIS — M9903 Segmental and somatic dysfunction of lumbar region: Secondary | ICD-10-CM | POA: Diagnosis not present

## 2022-02-13 DIAGNOSIS — M47812 Spondylosis without myelopathy or radiculopathy, cervical region: Secondary | ICD-10-CM | POA: Diagnosis not present

## 2022-02-13 DIAGNOSIS — M9901 Segmental and somatic dysfunction of cervical region: Secondary | ICD-10-CM | POA: Diagnosis not present

## 2022-02-25 ENCOUNTER — Other Ambulatory Visit: Payer: Self-pay | Admitting: Pulmonary Disease

## 2022-02-27 ENCOUNTER — Encounter: Payer: Self-pay | Admitting: Pulmonary Disease

## 2022-02-27 ENCOUNTER — Ambulatory Visit: Payer: Medicare Other | Admitting: Pulmonary Disease

## 2022-02-27 ENCOUNTER — Other Ambulatory Visit: Payer: Self-pay | Admitting: Pulmonary Disease

## 2022-02-27 VITALS — BP 146/60 | HR 67 | Temp 97.9°F | Ht 64.0 in | Wt 176.0 lb

## 2022-02-27 DIAGNOSIS — G4733 Obstructive sleep apnea (adult) (pediatric): Secondary | ICD-10-CM

## 2022-02-27 NOTE — Patient Instructions (Signed)
X increase by 1 level each week

## 2022-02-27 NOTE — Progress Notes (Signed)
   Subjective:    Patient ID: [name] [name], female    DOB: [birth year], 76 y.o.   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 Polymyalgia rheumatica was diagnosed in 2016, good response to steroids  She was intolerant of CPAP and underwent placement of hypoglossal nerve stimulator implant on 12/18/2021   1 month follow-up visit post activation 01/31/2022  She is sleeping through the night.  Mild snoring has been noted by her husband.  She denies daytime naps She developed some discomfort when she went up to level 4 and has back down to the level 3 and then went back up again  She remains on 5 mg of prednisone  Activation settings:  Sensation was at 0.5 V. Level 1 was set at 0.7 V and level 10 was at 1.7 V.   Remote was set with a 30-minute ramp, 8 hours sleep time and a 15-minute pause time  Significant tests/ events reviewed HST 05/2017 AHI 32/h  Review of Systems neg for any significant sore throat, dysphagia, itching, sneezing, nasal congestion or excess/ purulent secretions, fever, chills, sweats, unintended wt loss, pleuritic or exertional cp, hempoptysis, orthopnea pnd or change in chronic leg swelling. Also denies presyncope, palpitations, heartburn, abdominal pain, nausea, vomiting, diarrhea or change in bowel or urinary habits, dysuria,hematuria, rash, arthralgias, visual complaints, headache, numbness weakness or ataxia.      Objective:   Physical Exam  Gen. Pleasant, obese, in no distress ENT - no lesions, no post nasal drip Neck: No JVD, no thyromegaly, no carotid bruits Lungs: no use of accessory muscles, no dullness to percussion, decreased without rales or rhonchi  Cardiovascular: Rhythm regular, heart sounds  normal, no murmurs or gallops, no peripheral edema Musculoskeletal: No deformities, no cyanosis or clubbing , no  tremors        Assessment & Plan:

## 2022-02-27 NOTE — Assessment & Plan Note (Signed)
Hypoglossal nerve stimulator was activated 01/31/2022.  Device was interrogated today, incision was checked, tongue protrusion was examined  1.  Stimulation level : Sensation 0.5 V , functional level 0.7 V lower limit, upper limit 1.7 V.  She is currently at level 3 -4 2.  Start delay 30 minutes, pause time 15 minutes, duration 9 hours 3.  Sensing waveform was analyzed for 3 minutes 4.  Sleep remote education was provided patient demonstrated competency with the remote and was aware of patient Instruction videos and sleep remote guide 5.  Patient was instructed to step up levels by 1 level (0.1 V ) every week 6.  Check-in visit in 1 month to ensure optimization prior to sleep study, that they are stepping up levels, using therapy " all night, every night" and to evaluate subjective benefit.   7.  Inspire titration sleep study will be scheduled 1 to 2 weeks after optimization at next visit

## 2022-03-19 DIAGNOSIS — M79644 Pain in right finger(s): Secondary | ICD-10-CM | POA: Diagnosis not present

## 2022-03-19 DIAGNOSIS — M353 Polymyalgia rheumatica: Secondary | ICD-10-CM | POA: Diagnosis not present

## 2022-03-19 DIAGNOSIS — Z7952 Long term (current) use of systemic steroids: Secondary | ICD-10-CM | POA: Diagnosis not present

## 2022-03-19 DIAGNOSIS — Z6830 Body mass index (BMI) 30.0-30.9, adult: Secondary | ICD-10-CM | POA: Diagnosis not present

## 2022-04-04 ENCOUNTER — Ambulatory Visit: Payer: Medicare Other | Admitting: Pulmonary Disease

## 2022-04-04 ENCOUNTER — Other Ambulatory Visit: Payer: Self-pay | Admitting: Pulmonary Disease

## 2022-04-04 ENCOUNTER — Encounter: Payer: Self-pay | Admitting: Pulmonary Disease

## 2022-04-04 DIAGNOSIS — G4733 Obstructive sleep apnea (adult) (pediatric): Secondary | ICD-10-CM

## 2022-04-04 NOTE — Assessment & Plan Note (Signed)
Hypoglossal nerve stimulator was reassessed today.  Goal of the follow-up visit was to ensure good compliance, good subjective benefit, good tongue motion and good sense lead waveforms .incision sites appear good, tongue protrusion was examined.  Download was reviewed and usage appears to be up to 8 hours average per night.  Spouse reports that snoring is decreased and she has more energy.  She denies any discomfort.  She is at level 7 = 1.3V Programming :  1.  Stimulation level : Sensation 0.5 V , functional level 0.7 V lower limit, upper limit 1.7 V 2.  Start delay was increased to 40 minutes, pause time 15 minutes, duration 9 hours 3.  Sensing waveform was analyzed for 3 minutes 4.  Sleep remote education was provided patient demonstrated competency with the remote and was aware of patient Instruction videos and sleep remote guide 5.  Patient was instructed to step up levels by 1 level (0.1 V ) every week 6.   Inspire titration sleep study has been scheduled on 8/2 and follow-up office visit has been scheduled. Download was reviewed to confirm good compliance with the device  .[email or web address]

## 2022-04-04 NOTE — Progress Notes (Signed)
   Subjective:    Patient ID: [name] [name], female    DOB: 20-Jun-1946, 76 y.o.   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 Polymyalgia rheumatica was diagnosed in 2016, good response to steroids   She was intolerant of CPAP and underwent placement of hypoglossal nerve stimulator implant on 12/18/2021   Activation settings:  Sensation was at 0.5 V. Level 1 was set at 0.7 V and level 10 was at 1.7 V.   Remote was set with a 30-minute ramp, 8 hours sleep time and a 15-minute pause time  She is sleeping well, she has been able to increase to level 7 on the remote which is 1.3 V.  This does not cause her discomfort. No snoring has been noted by her husband.  She occasionally has a sleep latency about 45 minutes and requests increase in start delay   Significant tests/ events reviewed HST 05/2017 AHI 32/h  Review of Systems neg for any significant sore throat, dysphagia, itching, sneezing, nasal congestion or excess/ purulent secretions, fever, chills, sweats, unintended wt loss, pleuritic or exertional cp, hempoptysis, orthopnea pnd or change in chronic leg swelling. Also denies presyncope, palpitations, heartburn, abdominal pain, nausea, vomiting, diarrhea or change in bowel or urinary habits, dysuria,hematuria, rash, arthralgias, visual complaints, headache, numbness weakness or ataxia.     Objective:   Physical Exam  Gen. Pleasant, obese, in no distress ENT - no lesions, no post nasal drip Neck: No JVD, no thyromegaly, no carotid bruits Lungs: no use of accessory muscles, no dullness to percussion, decreased without rales or rhonchi  Cardiovascular: Rhythm regular, heart sounds  normal, no murmurs or gallops, no peripheral edema Musculoskeletal: No deformities, no cyanosis or clubbing , no tremors       Assessment & Plan:

## 2022-04-04 NOTE — Patient Instructions (Signed)
Level 7 = 1.3 V on the remote Start delay increased to 37 m  Good luck with the sleep study

## 2022-04-19 DIAGNOSIS — E7800 Pure hypercholesterolemia, unspecified: Secondary | ICD-10-CM | POA: Diagnosis not present

## 2022-04-19 DIAGNOSIS — E119 Type 2 diabetes mellitus without complications: Secondary | ICD-10-CM | POA: Diagnosis not present

## 2022-04-19 DIAGNOSIS — M353 Polymyalgia rheumatica: Secondary | ICD-10-CM | POA: Diagnosis not present

## 2022-04-19 DIAGNOSIS — I10 Essential (primary) hypertension: Secondary | ICD-10-CM | POA: Diagnosis not present

## 2022-04-24 ENCOUNTER — Ambulatory Visit (HOSPITAL_BASED_OUTPATIENT_CLINIC_OR_DEPARTMENT_OTHER): Payer: Medicare Other | Attending: Pulmonary Disease | Admitting: Pulmonary Disease

## 2022-04-24 DIAGNOSIS — G4733 Obstructive sleep apnea (adult) (pediatric): Secondary | ICD-10-CM | POA: Insufficient documentation

## 2022-04-25 DIAGNOSIS — G4733 Obstructive sleep apnea (adult) (pediatric): Secondary | ICD-10-CM

## 2022-04-25 NOTE — Procedures (Signed)
Patient Name: [name] [name] Date: 04/24/2022 Gender: Female D.O.B: 1945-12-23 Age (years): 26 Referring Provider: Kara Mead MD, ABSM Height (inches): 64 Interpreting Physician: Kara Mead MD, ABSM Weight (lbs): 172 RPSGT: Jorge Ny BMI: 30 MRN: [id number] Neck Size: 15.50 <br> <br> CLINICAL INFORMATION The patient is referred for inspire  titration to treat sleep apnea.  HST 05/2017 AHI 32/h  SLEEP STUDY TECHNIQUE As per the AASM Manual for the Scoring of Sleep and Associated Events v2.3 (April 2016) with a hypopnea requiring 4% desaturations.  The channels recorded and monitored were frontal, central and occipital EEG, electrooculogram (EOG), submentalis EMG (chin), nasal and oral airflow, thoracic and abdominal wall motion, anterior tibialis EMG, snore microphone, electrocardiogram, and pulse oximetry. Continuous positive airway pressure (CPAP) was initiated at the beginning of the study and titrated to treat sleep-disordered breathing.  MEDICATIONS Medications self-administered by patient taken the night of the study : Primal Sleep, Full spectrium sleepy CBD, Unisome Simple Slumbers, BENADRYL, Magnesium Glycinate  TECHNICIAN COMMENTS Comments added by technician: Patient was restless all through the night. Patient had difficulty initiating sleep.  RESPIRATORY PARAMETERS Optimal amplitude (v): 1.5 AHI at Optimal Pressure (/hr): 8.9 Overall Minimal O2 (%): 86.0 Supine % at Optimal Pressure (%): 0 Minimal O2 at Optimal Pressure (%): 86%   SLEEP ARCHITECTURE The study was initiated at 10:24:16 PM and ended at 5:33:57 AM.  Sleep onset time was 45.3 minutes and the sleep efficiency was 61.4%%. The total sleep time was 263.9 minutes.  The patient spent 17.2%% of the night in stage N1 sleep, 76.7%% in stage N2 sleep, 0.0%% in stage N3 and 6% in REM.Stage REM latency was 304.0 minutes  Wake after sleep onset was 120.5. Alpha intrusion was absent. Supine sleep was  34.30%.  CARDIAC DATA The 2 lead EKG demonstrated sinus rhythm. The mean heart rate was 68.5 beats per minute. Other EKG findings include: None.   LEG MOVEMENT DATA The total Periodic Limb Movements of Sleep (PLMS) were 0. The PLMS index was 0.0. A PLMS index of <15 is considered normal in adults.  IMPRESSIONS - An optimal amplitude of 1.5 V was selected for this patient based on the available study data. Incoming amplitude was 1.3 V, Titration was started at 1.1 V & maximum of 1.5 V final voltage of 1.5  - Moderate oxygen desaturations were observed during this titration (min O2 = 86.0%). - The patient snored with moderate snoring volume during this titration study. - No cardiac abnormalities were observed during this study. - Clinically significant periodic limb movements were not noted during this study. Arousals associated with PLMs were significant.   DIAGNOSIS - Obstructive Sleep Apnea (G47.33)   RECOMMENDATIONS - Recommend Setting amplitude to 1.5V & higher if tolerated. May need to repeat sleep study at optimal level once achieved - Avoid alcohol, sedatives and other CNS depressants that may worsen sleep apnea and disrupt normal sleep architecture. - Sleep hygiene should be reviewed to assess factors that may improve sleep quality. - Weight management and regular exercise should be initiated or continued. - Return to [facility] for re-evaluation after 4 weeks of therapy  Lang Zingg V. Elsworth Soho MD

## 2022-04-30 IMAGING — CT CT CARDIAC CORONARY ARTERY CALCIUM SCORE
3 series · 14 of 20 positions shown, 16 images · non-contrast
Comparison: None.

CLINICAL DATA: 75-year-old Caucasian female with history of
hyperlipidemia, hypertension, diabetes and prior smoking history.

EXAM:
CT CARDIAC CORONARY ARTERY CALCIUM SCORE
TECHNIQUE: Non-contrast imaging through the heart was performed using
prospective ECG gating. Image post processing was performed on an
independent workstation, allowing for quantitative analysis of the
heart and coronary arteries. Note that this exam targets the heart
and the chest was not imaged in its entirety.

[Series 2: calcium scoring 2.00 qr36 bestdiast 72% hrt calciu · axial · 0.40mm/px · z∈[+1672,+1756]mm · 4 of 70 slices shown]
[im 14/70  vessel]
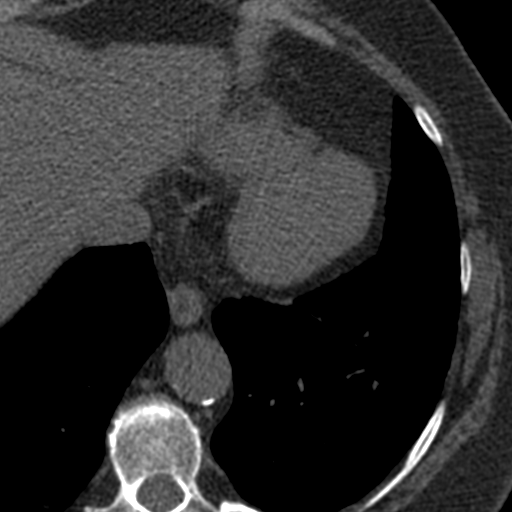
[im 28/70  vessel]
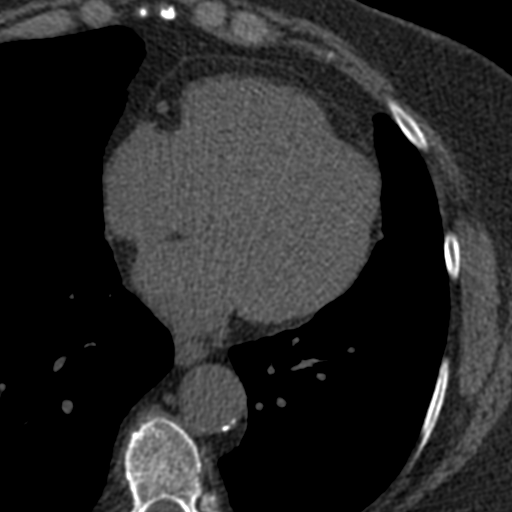
[im 42/70  vessel]
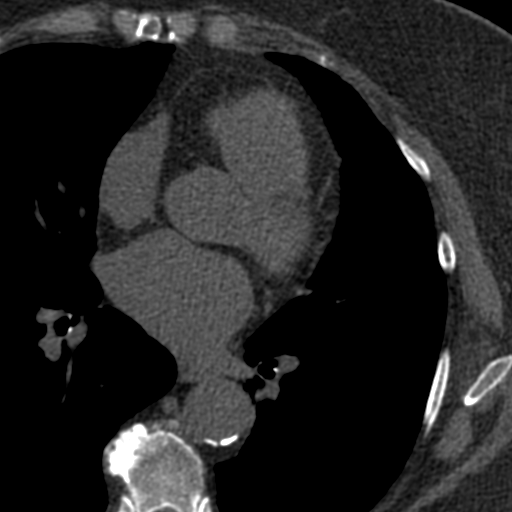
[im 56/70  vessel]
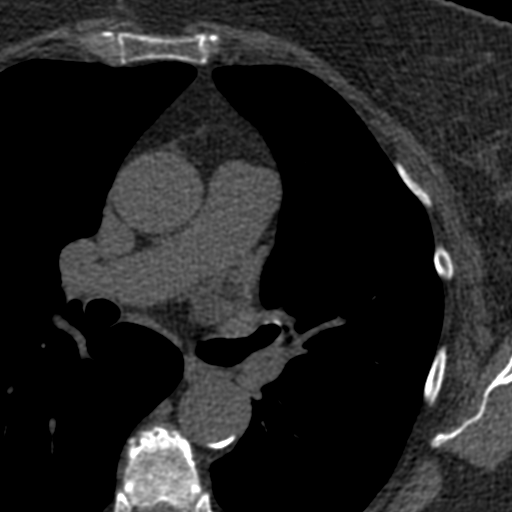

[Series 3: calcium scoring 2.00 br40 bestdiast 72% axial · axial · 0.63mm/px · z∈[+1668,+1760]mm · 5 of 70 slices shown, 7 images]
[im 12/70  vessel]
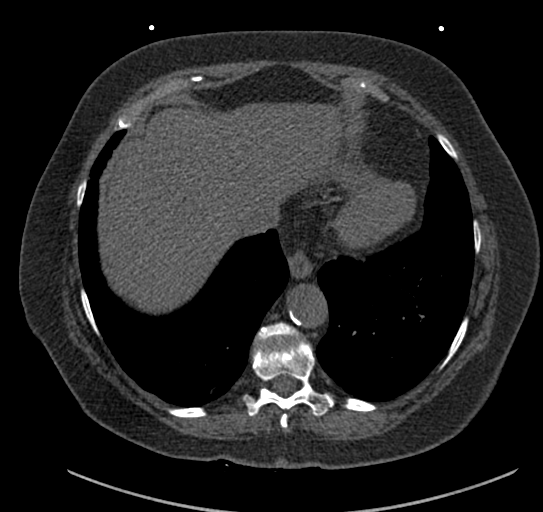
[im 12/70  lung]
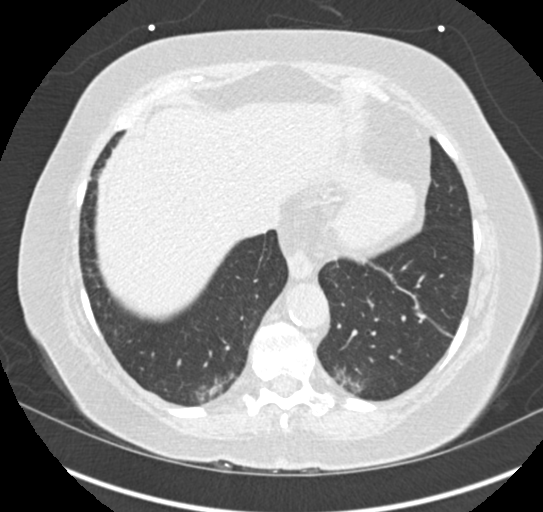
[im 24/70  vessel]
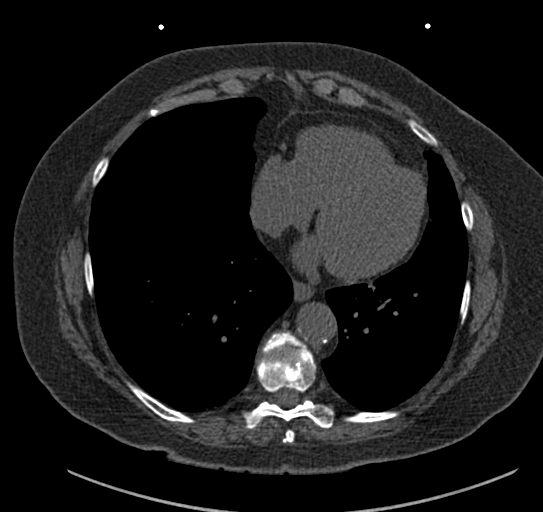
[im 35/70  vessel]
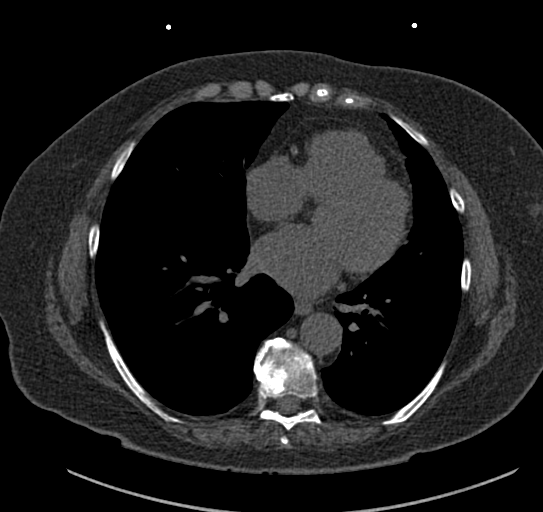
[im 47/70  vessel]
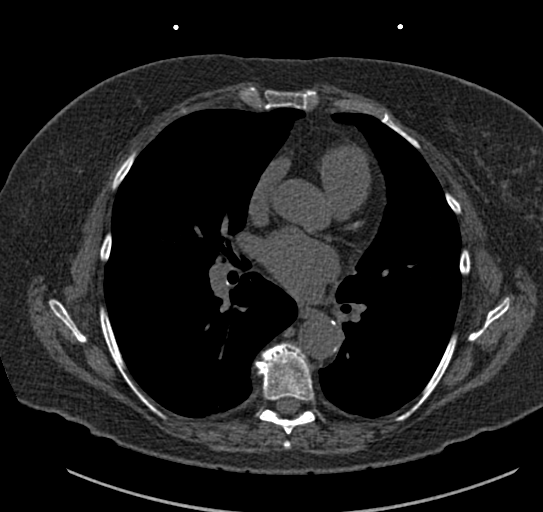
[im 58/70  vessel]
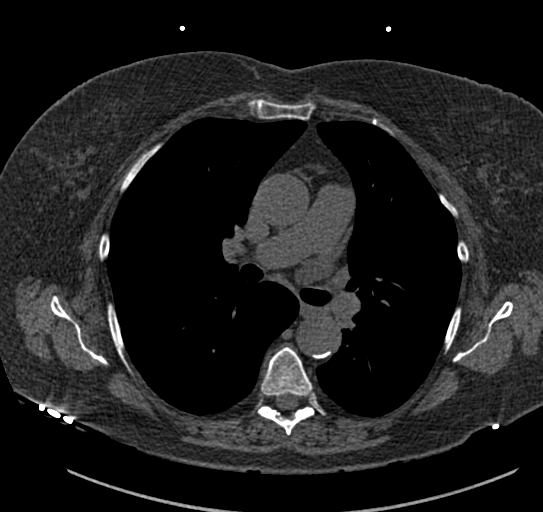
[im 58/70  lung]
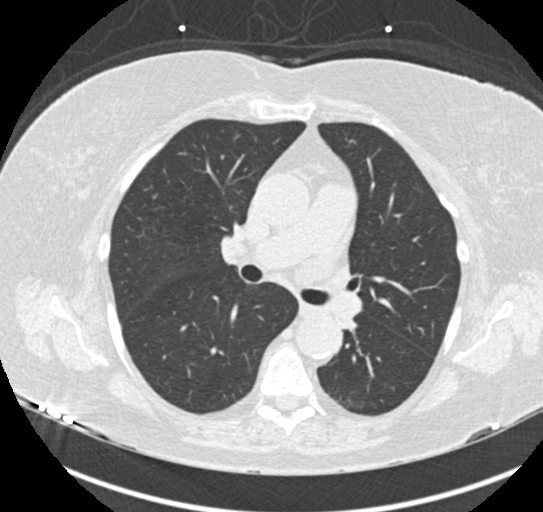

[Series 9: calcium scoring 2.00 br60 bestdiast 72% lungs · axial · 0.63mm/px · z∈[+1668,+1760]mm · 5 of 70 slices shown]
[im 12/70  vessel]
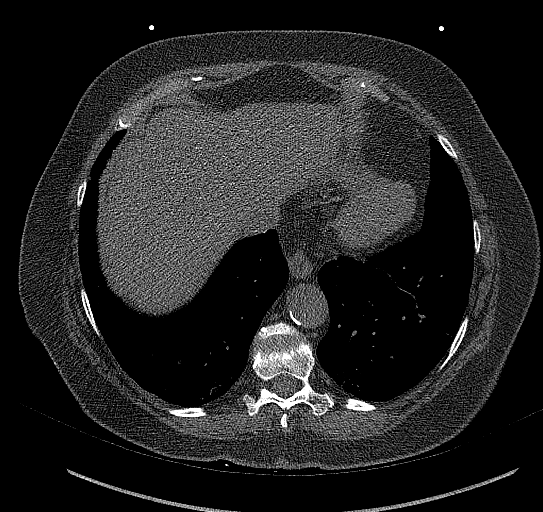
[im 24/70  vessel]
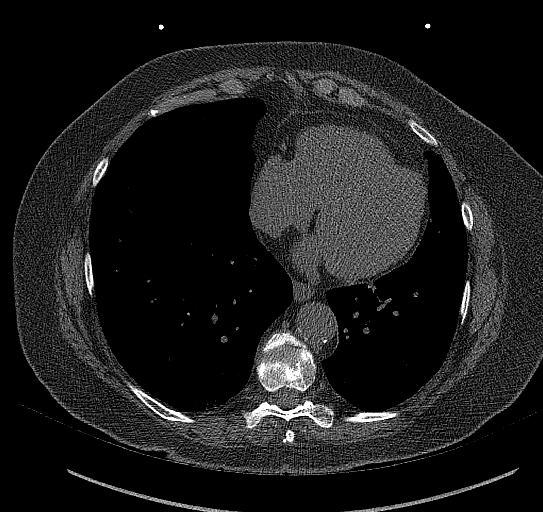
[im 35/70  vessel]
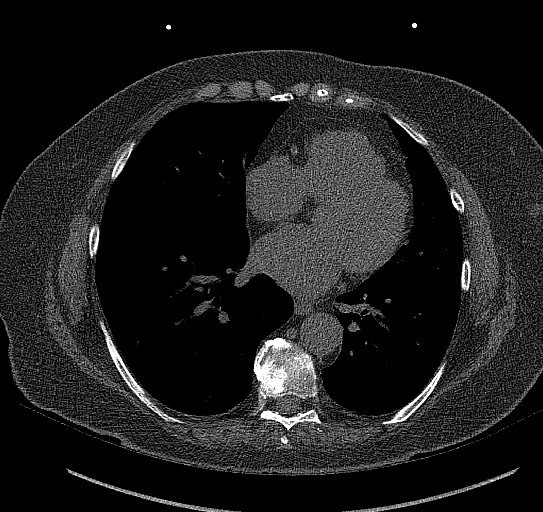
[im 47/70  vessel]
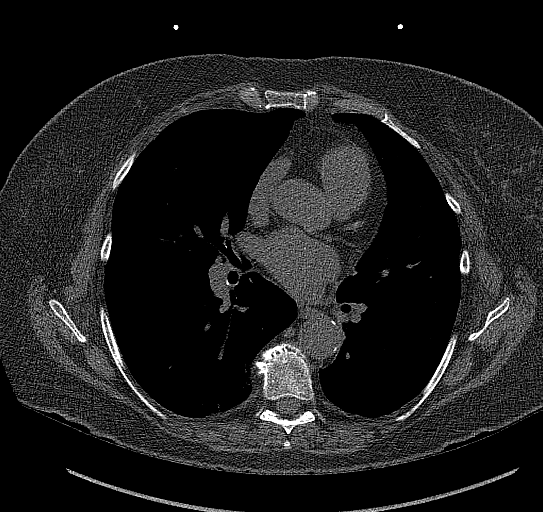
[im 58/70  vessel]
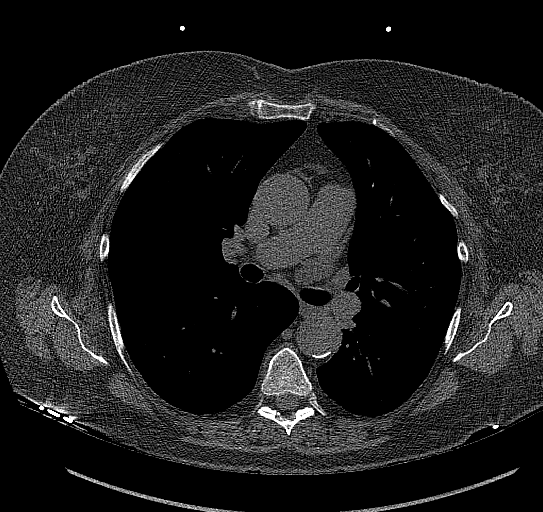

[14 of 20 positions shown; findings below may reference images not displayed]

FINDINGS: CORONARY CALCIUM SCORES:

Left Main: 0

LAD:

LCx: 0

RCA:

Total Agatston Score:

[HOSPITAL] percentile: 44

AORTA MEASUREMENTS:

Ascending Aorta: 33 mm

Descending Aorta: 28 mm

OTHER FINDINGS:

The heart size is within normal limits. No pericardial fluid is
identified. Visualized segments of the thoracic aorta and central
pulmonary arteries are normal in caliber. There is calcified plaque
in the descending thoracic aorta. Visualized mediastinum and hilar
regions demonstrate no lymphadenopathy or masses. Small to
moderate-sized hiatal hernia present. Visualized lungs show no
evidence of pulmonary edema, consolidation, pneumothorax, nodule or
pleural fluid. Visualized upper abdomen and bony structures are
unremarkable.
IMPRESSION: 1. Coronary calcium score of 49.7 is at the 44th percentile for the
patient's age, sex and race.
2. Aortic atherosclerosis.
3. Small to moderate hiatal hernia.

## 2022-05-22 ENCOUNTER — Ambulatory Visit: Payer: Medicare Other | Admitting: Pulmonary Disease

## 2022-05-22 ENCOUNTER — Encounter: Payer: Self-pay | Admitting: Pulmonary Disease

## 2022-05-22 DIAGNOSIS — G4733 Obstructive sleep apnea (adult) (pediatric): Secondary | ICD-10-CM

## 2022-05-22 NOTE — Progress Notes (Signed)
   Subjective:    Patient ID: [name] [name], female    DOB: June 25, 1946, 76 y.o.   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 Polymyalgia rheumatica was diagnosed in 2016, good response to steroids   She was intolerant of CPAP and underwent placement of hypoglossal nerve stimulator implant on 12/18/2021    Activation settings:  Sensation was at 0.5 V. Remote was set with a 30-minute ramp, 8 hours sleep time and a 15-minute pause time  She underwent inspire titration study. Incoming amplitude was 1.3 V , good control of events was noted at 1.2 and 1.3 V and there was over-activation when increased to 1.5 V  She increased amplitude to 1.6 V and has increased hoarseness  Significant tests/ events reviewed HST 05/2017 AHI 32/h   Review of Systems neg for any significant sore throat, dysphagia, itching, sneezing, nasal congestion or excess/ purulent secretions, fever, chills, sweats, unintended wt loss, pleuritic or exertional cp, hempoptysis, orthopnea pnd or change in chronic leg swelling. Also denies presyncope, palpitations, heartburn, abdominal pain, nausea, vomiting, diarrhea or change in bowel or urinary habits, dysuria,hematuria, rash, arthralgias, visual complaints, headache, numbness weakness or ataxia.     Objective:   Physical Exam  Gen. Pleasant, obese, in no distress ENT - no lesions, no post nasal drip Neck: No JVD, no thyromegaly, no carotid bruits Lungs: no use of accessory muscles, no dullness to percussion, decreased without rales or rhonchi  Cardiovascular: Rhythm regular, heart sounds  normal, no murmurs or gallops, no peripheral edema Musculoskeletal: No deformities, no cyanosis or clubbing , no tremors       Assessment & Plan:

## 2022-05-22 NOTE — Assessment & Plan Note (Signed)
Hypoglossal nerve stimulator was reassessed today.  Goal of the follow-up visit was to ensure good compliance, good subjective benefit, good tongue motion and good sense lead waveforms .incision sites appear good, tongue protrusion was examined.    She denies any discomfort.   Programming :  1.  Stimulation level :  functional level 1.0 V lower limit, level 3 =1.3 V , max 1.5 V 2.  Start delay was increased to 60 minutes, pause time 15 minutes, duration 10 hours 3.  Sensing waveform was analyzed for 3 minutes 4.  Sleep remote education was provided patient demonstrated competency with the remote and was aware of patient Instruction videos and sleep remote guide 5.  Patient was instructed to adjust levels by 1-2 levels if needed 6.   Inspire titration sleep study was reviewed We will reassess in 3 months for compliance

## 2022-05-22 NOTE — Patient Instructions (Signed)
You are set at level 3 = 1.2 V You can adjust 1-2 levels lower or higher as needed

## 2022-06-04 ENCOUNTER — Ambulatory Visit
Admission: RE | Admit: 2022-06-04 | Discharge: 2022-06-04 | Disposition: A | Discharge status: Home / Self Care | Payer: Medicare Other | Source: Ambulatory Visit | Attending: Internal Medicine | Admitting: Internal Medicine

## 2022-06-04 ENCOUNTER — Other Ambulatory Visit: Payer: Self-pay | Admitting: Internal Medicine

## 2022-06-04 DIAGNOSIS — R112 Nausea with vomiting, unspecified: Secondary | ICD-10-CM | POA: Diagnosis not present

## 2022-06-04 DIAGNOSIS — R109 Unspecified abdominal pain: Secondary | ICD-10-CM | POA: Diagnosis not present

## 2022-06-04 DIAGNOSIS — E119 Type 2 diabetes mellitus without complications: Secondary | ICD-10-CM | POA: Diagnosis not present

## 2022-06-04 DIAGNOSIS — M47816 Spondylosis without myelopathy or radiculopathy, lumbar region: Secondary | ICD-10-CM | POA: Diagnosis not present

## 2022-06-04 DIAGNOSIS — I10 Essential (primary) hypertension: Secondary | ICD-10-CM | POA: Diagnosis not present

## 2022-06-04 DIAGNOSIS — I700 Atherosclerosis of aorta: Secondary | ICD-10-CM | POA: Diagnosis not present

## 2022-06-04 DIAGNOSIS — R197 Diarrhea, unspecified: Secondary | ICD-10-CM | POA: Diagnosis not present

## 2022-06-04 MED ORDER — IOPAMIDOL (ISOVUE-300) INJECTION 61%
100.0000 mL | Freq: Once | INTRAVENOUS | Status: AC | PRN
  Administered 2022-06-04: 100 mL via INTRAVENOUS

## 2022-06-19 ENCOUNTER — Other Ambulatory Visit: Payer: Self-pay | Admitting: Internal Medicine

## 2022-06-19 DIAGNOSIS — R197 Diarrhea, unspecified: Secondary | ICD-10-CM

## 2022-06-19 DIAGNOSIS — R1110 Vomiting, unspecified: Secondary | ICD-10-CM

## 2022-06-25 DIAGNOSIS — I10 Essential (primary) hypertension: Secondary | ICD-10-CM | POA: Diagnosis not present

## 2022-06-25 DIAGNOSIS — M8580 Other specified disorders of bone density and structure, unspecified site: Secondary | ICD-10-CM | POA: Diagnosis not present

## 2022-06-25 DIAGNOSIS — E119 Type 2 diabetes mellitus without complications: Secondary | ICD-10-CM | POA: Diagnosis not present

## 2022-06-25 DIAGNOSIS — R7989 Other specified abnormal findings of blood chemistry: Secondary | ICD-10-CM | POA: Diagnosis not present

## 2022-06-25 DIAGNOSIS — E785 Hyperlipidemia, unspecified: Secondary | ICD-10-CM | POA: Diagnosis not present

## 2022-06-26 ENCOUNTER — Ambulatory Visit
Admission: RE | Admit: 2022-06-26 | Discharge: 2022-06-26 | Disposition: A | Discharge status: Home / Self Care | Payer: Medicare Other | Source: Ambulatory Visit | Attending: Internal Medicine | Admitting: Internal Medicine

## 2022-06-26 DIAGNOSIS — R112 Nausea with vomiting, unspecified: Secondary | ICD-10-CM | POA: Diagnosis not present

## 2022-06-26 DIAGNOSIS — R1110 Vomiting, unspecified: Secondary | ICD-10-CM

## 2022-06-26 DIAGNOSIS — K8020 Calculus of gallbladder without cholecystitis without obstruction: Secondary | ICD-10-CM | POA: Diagnosis not present

## 2022-06-26 DIAGNOSIS — R197 Diarrhea, unspecified: Secondary | ICD-10-CM

## 2022-07-02 DIAGNOSIS — I10 Essential (primary) hypertension: Secondary | ICD-10-CM | POA: Diagnosis not present

## 2022-07-02 DIAGNOSIS — G4733 Obstructive sleep apnea (adult) (pediatric): Secondary | ICD-10-CM | POA: Diagnosis not present

## 2022-07-02 DIAGNOSIS — Z23 Encounter for immunization: Secondary | ICD-10-CM | POA: Diagnosis not present

## 2022-07-02 DIAGNOSIS — R82998 Other abnormal findings in urine: Secondary | ICD-10-CM | POA: Diagnosis not present

## 2022-07-02 DIAGNOSIS — Z0000 Encounter for general adult medical examination without abnormal findings: Secondary | ICD-10-CM | POA: Diagnosis not present

## 2022-07-02 DIAGNOSIS — R109 Unspecified abdominal pain: Secondary | ICD-10-CM | POA: Diagnosis not present

## 2022-07-17 ENCOUNTER — Ambulatory Visit: Payer: Medicare Other | Admitting: Pulmonary Disease

## 2022-07-17 ENCOUNTER — Encounter: Payer: Self-pay | Admitting: Pulmonary Disease

## 2022-07-17 VITALS — BP 140/60 | HR 66 | Temp 97.8°F | Ht 64.0 in | Wt 170.0 lb

## 2022-07-17 DIAGNOSIS — G4733 Obstructive sleep apnea (adult) (pediatric): Secondary | ICD-10-CM | POA: Diagnosis not present

## 2022-07-17 DIAGNOSIS — I10 Essential (primary) hypertension: Secondary | ICD-10-CM

## 2022-07-17 NOTE — Assessment & Plan Note (Signed)
Systolic blood pressure slight high today. Denies headache or blurred vision, she will follow-up with PCP

## 2022-07-17 NOTE — Assessment & Plan Note (Signed)
Inspire remote is set at 1.2 V We will check a home sleep test at this level to confirm good control of events. She is very compliant, I reviewed download. Start delay is set at 60 minutes, pause time 15 minutes, duration 10 hours

## 2022-07-17 NOTE — Progress Notes (Signed)
   Subjective:    Patient ID: [name] [name], female    DOB: 06-07-1946, 76 y.o.   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 Polymyalgia rheumatica was diagnosed in 2016, good response to steroids   She was intolerant of CPAP and underwent placement of hypoglossal nerve stimulator implant on 12/18/2021    Activation settings:  Sensation was at 0.5 V. Remote was set with a 30-minute ramp, 8 hours sleep time and a 15-minute pause time   She underwent inspire titration study. Incoming amplitude was 1.3 V , good control of events was noted at 1.2 and 1.3 V and there was over-activation when increased to 1.5 V   She increased amplitude to 1.6 V and had increased hoarseness At last office visit we reset her remote to 1.2 V with a range of 1.0-1.4 She is very compliant, husband has noted mild snoring. She feels rested when she wakes up in the morning. Denies tongue discomfort    Significant tests/ events reviewed  inspire titration study 04/24/22 >>  Incoming amplitude was 1.3 V , good control of events was noted at 1.2 and 1.3 V and there was over-activation when increased to 1.5 V    HST 05/2017 AHI 32/h  Review of Systems neg for any significant sore throat, dysphagia, itching, sneezing, nasal congestion or excess/ purulent secretions, fever, chills, sweats, unintended wt loss, pleuritic or exertional cp, hempoptysis, orthopnea pnd or change in chronic leg swelling. Also denies presyncope, palpitations, heartburn, abdominal pain, nausea, vomiting, diarrhea or change in bowel or urinary habits, dysuria,hematuria, rash, arthralgias, visual complaints, headache, numbness weakness or ataxia.     Objective:   Physical Exam  Gen. Pleasant, obese, in no distress ENT - no lesions, no post nasal drip, tongue midline, good protrusion Neck: No JVD, no  thyromegaly, no carotid bruits Lungs: no use of accessory muscles, no dullness to percussion, decreased without rales or rhonchi  Cardiovascular: Rhythm regular, heart sounds  normal, no murmurs or gallops, no peripheral edema Musculoskeletal: No deformities, no cyanosis or clubbing , no tremors       Assessment & Plan:

## 2022-07-17 NOTE — Patient Instructions (Signed)
X home sleep test on inspire

## 2022-07-23 DIAGNOSIS — Z1231 Encounter for screening mammogram for malignant neoplasm of breast: Secondary | ICD-10-CM | POA: Diagnosis not present

## 2022-07-24 ENCOUNTER — Other Ambulatory Visit: Payer: Self-pay | Admitting: Cardiology

## 2022-08-19 ENCOUNTER — Ambulatory Visit: Payer: Self-pay | Admitting: Surgery

## 2022-08-19 DIAGNOSIS — K8010 Calculus of gallbladder with chronic cholecystitis without obstruction: Secondary | ICD-10-CM | POA: Diagnosis not present

## 2022-08-19 NOTE — H&P (Signed)
Subjective   Chief Complaint: New Consultation (gallbladder)     History of Present Illness: [name] [name] is a 76 y.o. female who is seen today as an office consultation at the request of Dr. Dagmar Hait for evaluation of New Consultation (gallbladder) .   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  The patient was on Ozempic for 2 years and had a 45 pound weight loss.  While she was on Ozempic, she began having irregular bowel movements, alternating between diarrhea and constipation.  She also would have regular episodes of nausea and bloating.  She also began having episodes of right-sided abdominal pain radiating around to her flank.  She underwent a CT scan that was unremarkable.  An ultrasound earlier this year revealed a small gallstone measuring 7 mm.  She had liver function test that were normal.  The patient has stopped taking Ozempic for the last 2 months.  Her symptoms are improved.  She comes in today to discuss possible elective cholecystectomy.  She has an inspire implant for sleep apnea. She is on chronic prednisone for PMR.  Review of Systems: A complete review of systems was obtained from the patient.  I have reviewed this information and discussed as appropriate with the patient.  See HPI as well for other ROS.  Review of Systems  Constitutional: Negative.   HENT: Negative.    Eyes: Negative.   Respiratory: Negative.    Cardiovascular: Negative.   Gastrointestinal:  Positive for heartburn.  Genitourinary: Negative.   Musculoskeletal: Negative.   Skin:  Positive for itching.  Neurological: Negative.   Endo/Heme/Allergies:  Bruises/bleeds easily.  Psychiatric/Behavioral: Negative.        Medical History: Past Medical History:  Diagnosis Date   Arrhythmia    GERD (gastroesophageal reflux disease)    Hyperlipidemia    Hypertension    Sleep apnea      Patient Active Problem List  Diagnosis   Acid reflux   Asthma, mild intermittent   Atrial fibrillation (CMS-HCC)   BMI 30.0-30.9,adult   HTN (hypertension)   HLD (hyperlipidemia)   Irritable bowel syndrome   Obstructive sleep apnea (adult) (pediatric)   PMR (polymyalgia rheumatica) (CMS-HCC)    Past Surgical History:  Procedure Laterality Date   Implantation of hypoglossal nerve stimulator (Right)  12/18/2021   HYSTERECTOMY     left foot surgery       Allergies  Allergen Reactions   Cephalosporins Other (See Comments)    'ran a fever"   Nickel Other (See Comments) and Rash   Sulfa (Sulfonamide Antibiotics) Hives and Other (See Comments)    hives   Apixaban Hives and Itching   Rivaroxaban Hives and Itching    Current Outpatient Medications on File Prior to Visit  Medication Sig Dispense Refill   chlorthalidone 25 MG tablet TAKE 1 TABLET BY MOUTH EVERY DAY IN THE MORNING WITH FOOD     ezetimibe (ZETIA) 10 mg tablet Take 1 tablet by mouth once daily     irbesartan (AVAPRO) 300 MG tablet Take by mouth     montelukast (SINGULAIR) 10 mg tablet TAKE 1 TABLET BY MOUTH EVERY DAY FOR COUGH OR SINUS CONGESTION     omeprazole (PRILOSEC) 20 MG DR capsule Take 20 mg by mouth once daily     predniSONE (DELTASONE) 5 MG tablet Take by mouth     sotaloL (BETAPACE) 80 MG tablet Take 80  mg by mouth 2 (two) times daily     venlafaxine (EFFEXOR-XR) 150 MG XR capsule Take 150 mg by mouth once daily     No current facility-administered medications on file prior to visit.    Family History  Problem Relation Age of Onset   High blood pressure (Hypertension) Mother    Obesity Sister    High blood pressure (Hypertension) Sister      Social History   Tobacco Use  Smoking Status Former   Types: Cigarettes   Quit date: 1981   Years since quitting: 42.9  Smokeless Tobacco Never     Social History   Socioeconomic History   Marital status: Married  Tobacco Use   Smoking status:  Former    Types: Cigarettes    Quit date: 1981    Years since quitting: 42.9   Smokeless tobacco: Never  Vaping Use   Vaping Use: Never used  Substance and Sexual Activity   Alcohol use: Yes    Alcohol/week: 2.0 standard drinks of alcohol    Types: 2 Glasses of wine per week   Drug use: Never    Objective:    Vitals:   08/19/22 0926  BP: 126/74  Pulse: 61  SpO2: 95%  Weight: 81.8 kg (180 lb 6.4 oz)  Height: 162.6 cm ('5\' 4"'$ )    Body mass index is 30.97 kg/m.  Physical Exam   Constitutional:  WDWN in NAD, conversant, no obvious deformities; lying in bed comfortably Eyes:  Pupils equal, round; sclera anicteric; moist conjunctiva; no lid lag HENT:  Oral mucosa moist; good dentition  Neck:  No masses palpated, trachea midline; no thyromegaly Lungs:  CTA bilaterally; normal respiratory effort CV:  Regular rate and rhythm; no murmurs; extremities well-perfused with no edema Abd:  +bowel sounds, soft, non-tender, no palpable organomegaly; small umbilical hernia Musc:  Normal gait; no apparent clubbing or cyanosis in extremities Lymphatic:  No palpable cervical or axillary lymphadenopathy Skin:  Warm, dry; no sign of jaundice Psychiatric - alert and oriented x 4; calm mood and affect   Labs, Imaging and Diagnostic Testing: CLINICAL DATA:  Nausea and vomiting   EXAM: ULTRASOUND ABDOMEN LIMITED RIGHT UPPER QUADRANT   COMPARISON:  CT abdomen pelvis June 04, 2022   FINDINGS: Gallbladder:   Cholelithiasis. Largest stone measures 7 mm. No gallbladder wall thickening or pericholecystic fluid. Negative sonographic Murphy's sign.   Common bile duct:   Diameter: 2.8 mm   Liver:   Diffusely increased in echogenicity. No focal lesion. Portal vein is patent on color Doppler imaging with normal direction of blood flow towards the liver.   Other: None.   IMPRESSION: Cholelithiasis without secondary signs of acute cholecystitis.   Increased hepatic parenchymal  echogenicity suggestive of steatosis.     Electronically Signed   By: Lovey Newcomer M.D.   On: 06/26/2022 09:51  CLINICAL DATA:  Right flank pain. Nausea, vomiting and diarrhea over the last 2 weeks.   EXAM: CT ABDOMEN AND PELVIS WITH CONTRAST   TECHNIQUE: Multidetector CT imaging of the abdomen and pelvis was performed using the standard protocol following bolus administration of intravenous contrast.   RADIATION DOSE REDUCTION: This exam was performed according to the departmental dose-optimization program which includes automated exposure control, adjustment of the mA and/or kV according to patient size and/or use of iterative reconstruction technique.   CONTRAST:  16m ISOVUE-300 IOPAMIDOL (ISOVUE-300) INJECTION 61%   COMPARISON:  03/09/2015   FINDINGS: Lower chest: Minimal linear scarring at the lung bases.  Hepatobiliary: Liver parenchyma is normal. Minimal fatty change adjacent to the falciform ligament, a frequent normal variant. No calcified stones.   Pancreas: Normal   Spleen: Normal   Adrenals/Urinary Tract: Adrenal glands are normal. Kidneys are normal. No mass, stone or hydronephrosis. Bladder is normal.   Stomach/Bowel: The stomach and small intestine are normal. No visible appendix. No evidence of inflammation in the right lower quadrant. The colon appears otherwise normal.   Vascular/Lymphatic: Aortic atherosclerosis. No aneurysm. IVC is normal. No adenopathy.   Reproductive: Previous hysterectomy.  No pelvic mass.   Other: No free fluid or air.   Musculoskeletal: Ordinary mild lower lumbar degenerative changes.   IMPRESSION: No abnormality seen to explain the clinical presentation. No evidence of urinary tract stone disease. No evidence of bowel or other solid organ pathology. No visible appendix. Previous appendectomy assumed.     Electronically Signed   By: Nelson Chimes M.D.   On: 06/04/2022 15:31  Assessment and Plan:  Diagnoses  and all orders for this visit:  Calculus of gallbladder with chronic cholecystitis without obstruction    Her symptoms seem much improved since stopping the Ozempic, but she would like to proceed with laparoscopic cholecystectomy.  Recommend laparoscopic cholecystectomy with intraoperative cholangiogram.The surgical procedure has been discussed with the patient.  Potential risks, benefits, alternative treatments, and expected outcomes have been explained.  All of the patient's questions at this time have been answered.  The likelihood of reaching the patient's treatment goal is good.  The patient understand the proposed surgical procedure and wishes to proceed.  The patient wants to wait till January to have her surgery.  Kayal Mula Jearld Adjutant, MD  08/19/2022 10:13 AM

## 2022-08-22 ENCOUNTER — Telehealth: Payer: Self-pay

## 2022-08-22 NOTE — Telephone Encounter (Signed)
   Pre-operative Risk Assessment    Patient Name: [name] [name]  DOB: 1945/12/11 MRN: [id number]      Request for Surgical Clearance    Procedure:   Laparoscopic Cholecystectomy   Date of Surgery:  Clearance TBD                                 Surgeon:  Dr. Donnie Mesa Surgeon's Group or Practice Name:  [facility]  Phone number:  [phone] Fax number:  [phone]   Type of Clearance Requested:   - Medical  - Pharmacy:  Hold Aspirin need instructions   Type of Anesthesia:  General    Additional requests/questions:    SignedJacqulynn Cadet   08/22/2022, 3:46 PM

## 2022-08-23 NOTE — Telephone Encounter (Signed)
Left message for the pt that she will need an appt in office for pre op clearance. Pt does have appt with Dr. Martinique 10/14/22, which pt may keep. We can schedule a sooner appt with APP for pre op clearance.

## 2022-08-23 NOTE — Telephone Encounter (Signed)
   Name: AASIA PEAVLER  DOB: 06-01-1946  MRN: [id number]  Primary Cardiologist: Peter Martinique, MD  Chart reviewed as part of pre-operative protocol coverage. Because of Rondalyn Belford Uhls's past medical history and time since last visit, she will require a follow-up in-office visit in order to better assess preoperative cardiovascular risk.  Pre-op covering staff: - Please schedule appointment and call patient to inform them. If patient already had an upcoming appointment within acceptable timeframe, please add "pre-op clearance" to the appointment notes so provider is aware. - Please contact requesting surgeon's office via preferred method (i.e, phone, fax) to inform them of need for appointment prior to surgery.   Mayra Reel, NP  08/23/2022, 8:59 AM

## 2022-08-27 NOTE — Telephone Encounter (Signed)
Patient returned call, patient is schedule for 12/14 for her in office preop clearance appt.

## 2022-09-04 ENCOUNTER — Ambulatory Visit: Payer: Medicare Other

## 2022-09-04 DIAGNOSIS — G4733 Obstructive sleep apnea (adult) (pediatric): Secondary | ICD-10-CM

## 2022-09-05 ENCOUNTER — Encounter: Payer: Self-pay | Admitting: Nurse Practitioner

## 2022-09-05 ENCOUNTER — Ambulatory Visit: Payer: Medicare Other | Attending: Nurse Practitioner | Admitting: Nurse Practitioner

## 2022-09-05 VITALS — BP 126/84 | HR 53 | Ht 64.0 in | Wt 179.4 lb

## 2022-09-05 DIAGNOSIS — I471 Supraventricular tachycardia, unspecified: Secondary | ICD-10-CM

## 2022-09-05 DIAGNOSIS — Z01810 Encounter for preprocedural cardiovascular examination: Secondary | ICD-10-CM

## 2022-09-05 DIAGNOSIS — M353 Polymyalgia rheumatica: Secondary | ICD-10-CM

## 2022-09-05 DIAGNOSIS — I10 Essential (primary) hypertension: Secondary | ICD-10-CM

## 2022-09-05 DIAGNOSIS — G4733 Obstructive sleep apnea (adult) (pediatric): Secondary | ICD-10-CM

## 2022-09-05 DIAGNOSIS — E782 Mixed hyperlipidemia: Secondary | ICD-10-CM

## 2022-09-05 DIAGNOSIS — I493 Ventricular premature depolarization: Secondary | ICD-10-CM | POA: Diagnosis not present

## 2022-09-05 DIAGNOSIS — I480 Paroxysmal atrial fibrillation: Secondary | ICD-10-CM

## 2022-09-05 NOTE — Patient Instructions (Signed)
Medication Instructions:  Your physician recommends that you continue on your current medications as directed. Please refer to the Current Medication list given to you today.   *If you need a refill on your cardiac medications before your next appointment, please call your pharmacy*   Lab Work: NONE ordered at this time of appointment   If you have labs (blood work) drawn today and your tests are completely normal, you will receive your results only by: [place] (if you have MyChart) OR A paper copy in the mail If you have any lab test that is abnormal or we need to change your treatment, we will call you to review the results.   Testing/Procedures: NONE ordered at this time of appointment     Follow-Up: At [facility], you and your health needs are our priority.  As part of our continuing mission to provide you with exceptional heart care, we have created designated Provider Care Teams.  These Care Teams include your primary Cardiologist (physician) and Advanced Practice Providers (APPs -  Physician Assistants and Nurse Practitioners) who all work together to provide you with the care you need, when you need it.  We recommend signing up for the patient portal called "MyChart".  Sign up information is provided on this After Visit Summary.  MyChart is used to connect with patients for Virtual Visits (Telemedicine).  Patients are able to view lab/test results, encounter notes, upcoming appointments, etc.  Non-urgent messages can be sent to your provider as well.   To learn more about what you can do with MyChart, go to [email or web address].    Your next appointment:   1 year(s)  The format for your next appointment:   In Person  Provider:   Peter Martinique, MD     Other Instructions   Important Information About Sugar

## 2022-09-05 NOTE — Progress Notes (Signed)
Office Visit    Patient Name: [name] [name] Date of Encounter: 09/05/2022  Primary Care Provider:  Prince Solian, MD Primary Cardiologist:  Peter Martinique, MD  Chief Complaint    76 year old female with a history of paroxysmal atrial fibrillation not on anticoagulation, PVCs, PSVT, hypertension,  hyperlipidemia, OSA on CPAP, polymyalgia rheumatica, and GERD who presents for follow-up related to atrial fibrillation and for preoperative cardiac evaluation.  Past Medical History    Past Medical History:  Diagnosis Date   Asthma 1976   Atrial fibrillation ([place])    Bronchitis    FUO (fever of unknown origin) 03/03/2015   GERD (gastroesophageal reflux disease)    Hepatitis 1980s   Hepatitis    HTN (hypertension)    Hypercholesterolemia    Night sweat 03/03/2015   PMR (polymyalgia rheumatica) ([place]) 03/15/2015   Polyarthritis 03/03/2015   Polymyalgia ([place]) 03/03/2015   Polymyositis ([place]) 03/03/2015   Sleep apnea    Type 2 diabetes mellitus ([place])    Past Surgical History:  Procedure Laterality Date   ABDOMINAL HYSTERECTOMY     APPENDECTOMY  09/24/1971   DG THUMB RIGHT HAND ([facility] HX)     DRUG INDUCED ENDOSCOPY N/A 11/14/2021   Procedure: DRUG INDUCED SLEEP ENDOSCOPY;  Surgeon: [name], MD;  Location: [place];  Service: ENT;  Laterality: N/A;   FOOT SURGERY Left 09/24/2007   IMPLANTATION OF HYPOGLOSSAL NERVE STIMULATOR Right 12/18/2021   Procedure: IMPLANTATION OF HYPOGLOSSAL NERVE STIMULATOR;  Surgeon: [name], MD;  Location: [place];  Service: ENT;  Laterality: Right;   KNEE ARTHROPLASTY  09/23/2005   right   OVARY SURGERY  09/24/1999   TUBAL LIGATION  09/23/1970   VAGINAL HYSTERECTOMY  09/24/1971    Allergies  Allergies  Allergen Reactions   Atorvastatin Other (See Comments)    Muscle cramps severe      Simvastatin Other (See Comments)    Severe muscle cramps   Cephalosporins     'ran a fever"   Nickel Rash    Sulfa Antibiotics Hives    hives   Sulfasalazine Hives    hives   Eliquis [Apixaban] Hives and Itching   Xarelto [Rivaroxaban] Hives and Itching    History of Present Illness    76 year old female with the above past medical history including paroxysmal atrial fibrillation not on anticoagulation, PVCs, PSVT, hypertension,  hyperlipidemia, OSA on CPAP, polymyalgia rheumatica, and GERD.  She was diagnosed with atrial fibrillation previously on Toprol, however, this caused significant fatigue.  She also noted fatigue with calcium channel blockers.  She was later transitioned to sotalol.  She has not on anticoagulation given history of adverse reaction to Eliquis and Xarelto (patient declined Pradaxa).  Monitor in 2020 in the setting of palpitations showed PVCs, NSVT, PSVT, no evidence of atrial fibrillation.  Palpitations resolved with magnesium supplementation.  She was last seen in the office on 01/25/2021 and was stable from a cardiac standpoint.   She presents today for follow-up and for preoperative cardiac evaluation for upcoming laparoscopic cholecystectomy with Dr. Donnie Mesa of [facility] surgery at [facility]. Since her last visit she has been stable from a cardiac standpoint.  She denies any palpitations, she is aware unaware of any episodes of atrial fibrillation.  She denies symptoms concerning for angina.  Her BP has been well-controlled.  Overall, she reports feeling well.  Home Medications    Current Outpatient Medications  Medication Sig Dispense Refill   albuterol (PROAIR HFA) 108 (  90 Base) MCG/ACT inhaler Inhale 2 puffs into the lungs every 6 (six) hours as needed. (Patient taking differently: Inhale 2 puffs into the lungs every 6 (six) hours as needed for wheezing or shortness of breath. Has not taken in "years") 1 Inhaler 5   Ascorbic Acid (VITAMIN C) 1000 MG tablet Take 1,000 mg by mouth daily.     aspirin EC 81 MG tablet Take 81 mg by mouth daily.      chlorthalidone (HYGROTON) 25 MG tablet Take 1 tablet (25 mg total) by mouth daily. 90 tablet 1   Cholecalciferol (VITAMIN D3) 125 MCG (5000 UT) CAPS Take 5,000 Units by mouth daily.     estradiol (VIVELLE-DOT) 0.05 MG/24HR patch Place 1 patch onto the skin 2 (two) times a week.     Evolocumab (REPATHA [place]) Inject into the skin. Once Q 2 weeks     ezetimibe (ZETIA) 10 MG tablet TAKE 1 TABLET BY MOUTH EVERY DAY 90 tablet 3   fluticasone (FLONASE) 50 MCG/ACT nasal spray Place 2 sprays into both nostrils daily.     HYDROcodone-acetaminophen (NORCO) 7.5-325 MG tablet Take 1 tablet by mouth every 6 (six) hours as needed for moderate pain. 12 tablet 0   irbesartan (AVAPRO) 300 MG tablet Take 300 mg by mouth daily.     Magnesium Oxide (MAG-OXIDE PO) Take 250 mg by mouth daily.     meclizine (ANTIVERT) 25 MG tablet Take 25 mg by mouth 3 (three) times daily as needed for dizziness.      melatonin 5 MG TABS Take 5 mg by mouth at bedtime.     montelukast (SINGULAIR) 10 MG tablet Take 10 mg by mouth daily.     Multiple Vitamin (MULTI-VITAMINS) TABS Take 1 tablet by mouth daily.      omeprazole (PRILOSEC) 20 MG capsule Take 20 mg by mouth daily.     PRALUENT 75 MG/ML SOAJ Inject 1 mL into the skin every 14 (fourteen) days.     predniSONE (DELTASONE) 5 MG tablet Take 5 mg by mouth daily with breakfast.     sotalol (BETAPACE) 80 MG tablet Take 80 mg by mouth 2 (two) times daily.     venlafaxine XR (EFFEXOR-XR) 150 MG 24 hr capsule venlafaxine ER 150 mg capsule,extended release 24 hr  TAKE 1 CAPSULE BY MOUTH EVERY DAY     No current facility-administered medications for this visit.     Review of Systems   She denies chest pain, palpitations, dyspnea, pnd, orthopnea, n, v, dizziness, syncope, edema, weight gain, or early satiety. All other systems reviewed and are otherwise negative except as noted above.   Physical Exam    VS:  BP 126/84 (BP Location: Left Arm, Patient Position: Sitting, Cuff Size: Large)    Pulse (!) 53   Ht '5\' 4"'$  (1.626 m)   Wt 179 lb 6.4 oz (81.4 kg)   SpO2 99%   BMI 30.79 kg/m  GEN: Well nourished, well developed, in no acute distress. HEENT: normal. Neck: Supple, no JVD, carotid bruits, or masses. Cardiac: RRR, no murmurs, rubs, or gallops. No clubbing, cyanosis, edema.  Radials/DP/PT 2+ and equal bilaterally.  Respiratory:  Respirations regular and unlabored, clear to auscultation bilaterally. GI: Soft, nontender, nondistended, BS + x 4. MS: no deformity or atrophy. Skin: warm and dry, no rash. Neuro:  Strength and sensation are intact. Psych: Normal affect.  Accessory Clinical Findings    ECG personally reviewed by me today -sinus bradycardia, 53 bpm- no acute changes.  Lab Results  Component Value Date   WBC 9.5 02/27/2015   HGB 10.5 (L) 02/27/2015   HCT 33.0 (L) 02/27/2015   MCV 87.1 02/27/2015   PLT 354 02/27/2015   Lab Results  Component Value Date   CREATININE 0.98 12/11/2021   BUN 18 12/11/2021   NA 136 12/11/2021   K 3.8 12/11/2021   CL 98 12/11/2021   CO2 28 12/11/2021   Lab Results  Component Value Date   ALT 38 (H) 04/21/2019   AST 37 04/21/2019   ALKPHOS 79 04/21/2019   BILITOT 0.8 04/21/2019   No results found for: "CHOL", "HDL", "LDLCALC", "LDLDIRECT", "TRIG", "CHOLHDL"  No results found for: "HGBA1C"  Assessment & Plan    1. Paroxysmal atrial fibrillation: Maintaining NSR. Not on anticoagulation due to history of adverse reaction to Eliquis and Xarelto (patient declined Pradaxa).  Continue sotalol.  2. PVCs/PSVT: Monitor in 2020 in the setting of palpitations showed PVCs, NSVT, PSVT, no evidence of atrial fibrillation.  Stable on sotalol.  3. Hypertension: BP well controlled. Continue current antihypertensive regimen.   4. Hyperlipidemia: LDL was 119 in 06/2022.  Continue aspirin, Repatha, Zetia.  5. OSA: Adherent to CPAP.   6. PMR: On chronic steroids.   7. Preoperative cardiac exam: According to the Revised Cardiac  Risk Index (RCRI), her Perioperative Risk of Major Cardiac Event is (%): 0.4. Her Functional Capacity in METs is: 8.42 according to the Duke Activity Status Index (DASI). Therefore, based on ACC/AHA guidelines, patient would be at acceptable risk for the planned procedure without further cardiovascular testing.  Pt states she has not been taking aspirin recently.  However, per office protocol, patient may hold aspirin for 5 to 7 days prior to procedure if necessary.  I will route this recommendation to the requesting party via Epic fax function.  8. Disposition: Follow-up in 1 year.      Lenna Sciara, NP 09/05/2022, 4:43 PM

## 2022-09-06 ENCOUNTER — Telehealth: Payer: Self-pay | Admitting: Pulmonary Disease

## 2022-09-06 DIAGNOSIS — G4733 Obstructive sleep apnea (adult) (pediatric): Secondary | ICD-10-CM | POA: Diagnosis not present

## 2022-09-06 NOTE — Telephone Encounter (Signed)
Discussed sleep study results with her. HST showed AHI of 8/hour, events predominantly in supine sleep, low saturation of 88% On inspire set is 1.2  Options include -Continue at same setting and avoid sleeping in supine sleep -Go up 1 level on the remote  She is willing to be [employer] for inspire

## 2022-09-24 DIAGNOSIS — Z6830 Body mass index (BMI) 30.0-30.9, adult: Secondary | ICD-10-CM | POA: Diagnosis not present

## 2022-09-24 DIAGNOSIS — Z7952 Long term (current) use of systemic steroids: Secondary | ICD-10-CM | POA: Diagnosis not present

## 2022-09-24 DIAGNOSIS — E669 Obesity, unspecified: Secondary | ICD-10-CM | POA: Diagnosis not present

## 2022-09-24 DIAGNOSIS — M353 Polymyalgia rheumatica: Secondary | ICD-10-CM | POA: Diagnosis not present

## 2022-09-24 DIAGNOSIS — M79644 Pain in right finger(s): Secondary | ICD-10-CM | POA: Diagnosis not present

## 2022-10-07 ENCOUNTER — Other Ambulatory Visit: Payer: Self-pay

## 2022-10-07 ENCOUNTER — Encounter (HOSPITAL_COMMUNITY): Payer: Self-pay | Admitting: Surgery

## 2022-10-07 NOTE — Anesthesia Preprocedure Evaluation (Signed)
Anesthesia Evaluation  Patient identified by MRN, date of birth, ID band Patient awake    Reviewed: Allergy & Precautions, NPO status , Patient's Chart, lab work & pertinent test results  Airway Mallampati: II  TM Distance: >3 FB Neck ROM: Full    Dental  (+) Teeth Intact, Dental Advisory Given   Pulmonary asthma , sleep apnea , former smoker   breath sounds clear to auscultation       Cardiovascular hypertension, Pt. on medications + dysrhythmias Atrial Fibrillation  Rhythm:Regular Rate:Normal     Neuro/Psych    GI/Hepatic ,GERD  ,,(+) Hepatitis -  Endo/Other  diabetes    Renal/GU negative Renal ROS     Musculoskeletal   Abdominal   Peds  Hematology   Anesthesia Other Findings   Reproductive/Obstetrics                             Anesthesia Physical Anesthesia Plan  ASA: 3  Anesthesia Plan: General   Post-op Pain Management: Tylenol PO (pre-op)*   Induction: Intravenous  PONV Risk Score and Plan: 4 or greater and Ondansetron, Dexamethasone, Midazolam and Scopolamine patch - Pre-op  Airway Management Planned: Oral ETT  Additional Equipment: None  Intra-op Plan:   Post-operative Plan: Extubation in OR  Informed Consent: I have reviewed the patients History and Physical, chart, labs and discussed the procedure including the risks, benefits and alternatives for the proposed anesthesia with the patient or authorized representative who has indicated his/her understanding and acceptance.     Dental advisory given  Plan Discussed with: CRNA  Anesthesia Plan Comments: (PAT note written 10/07/2022 by Myra Gianotti, PA-C.  )       Anesthesia Quick Evaluation

## 2022-10-07 NOTE — Progress Notes (Signed)
Spoke with pt for pre-op call. Pt has hx of A-fib. Pt is treated with Sotalol. Pt's cardiologist is Dr. Peter Martinique. Pt is treated for HTN and states she is not diabetic.  Pt has Inspire for her sleep apnea.   Shower instructions given to pt and she voiced understanding.

## 2022-10-07 NOTE — Progress Notes (Signed)
Anesthesia Chart Review: [name] [name]  Case: [id number] Date/Time: 10/08/22 1045   Procedure: LAPAROSCOPIC CHOLECYSTECTOMY WITH INTRAOPERATIVE CHOLANGIOGRAM   Anesthesia type: General   Pre-op diagnosis: CHRONIC CACULUS CHOLECYSTITIS   Location: [facility] OR ROOM 02 / [place] OR   Surgeons: Donnie Mesa, MD       DISCUSSION: Patient is a 77 year old female scheduled for the above procedure.  History includes former smoker (quit 09/23/80), PAF (diagnosed ~ 2003, on sotalol; not on anticoagulation, had adverse reaction to Eliquis and Xarelto; palpitations improved with Mg), hypercholesterolemia, asthma, HTN, DM2, polymyalgia rheumatica (2016, on chronic prednisone), OSA (s/p hypoglossal nerve stimulator 12/18/21), GERD. 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  Preoperative cardiology evaluation on 09/05/22 by Diona Browner, NP. She wrote, "Preoperative cardiac exam: According to the Revised Cardiac Risk Index (RCRI), her Perioperative Risk of Major Cardiac Event is (%): 0.4. Her Functional Capacity in METs is: 8.42 according to the Duke Activity Status Index (DASI). Therefore, based on ACC/AHA guidelines, patient would be at acceptable risk for the planned procedure without further cardiovascular testing.  Pt states she has not been taking aspirin recently.  However, per office protocol, patient may hold aspirin for 5 to 7 days prior to procedure if necessary." 1 year follow-up is planned.   Labs as indicated on arrival. Anesthesia team to evaluate on the day of surgery.   VS:  BP Readings from Last 3 Encounters:  09/05/22 126/84  07/17/22 (!) 140/60  05/22/22 126/74   Pulse Readings from Last 3 Encounters:  09/05/22 (!) 53  07/17/22 66  05/22/22 60     PROVIDERS: Prince Solian, MD is PCP  Kara Mead, MD is pulmonologist Leigh Aurora, MD is rheumatologist [place], Peter, MD is cardiologist [name], MD is ENT   LABS: For day of surgery as indicated.   OTHER: Per Telephone  encounter by Dr. Elsworth Soho on 09/06/22: "HST showed AHI of 8/hour, events predominantly in supine sleep, low saturation of 88% On inspire set is 1.2   Options include -Continue at same setting and avoid sleeping in supine sleep -Go up 1 level on the remote".   EKG: 09/05/22: SB at 53 bpm. Cannot rule out anterior infarct, age undetermined.   CV: CT Coronary Calcium Scoring 07/03/21: IMPRESSION: 1. Coronary calcium score of 49.7 is at the 44th percentile for the patient's age, sex and race. 2. Aortic atherosclerosis. 3. Small to moderate hiatal hernia.  Long term monitor 04/25/19-05/08/19: Normal sinus rhythm Rare PVCs, trigeminy One 6 beat run of NSVT Rare PACs. 6 runs of SVT. longest 18 seconds at rate max 152. symptomatic.   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   Sleep apnea    Type 2 diabetes mellitus ([place])     Past Surgical History:  Procedure Laterality Date   ABDOMINAL HYSTERECTOMY     APPENDECTOMY  09/24/1971   DG THUMB RIGHT HAND ([place] HX)     DRUG INDUCED ENDOSCOPY N/A 11/14/2021   Procedure: DRUG INDUCED SLEEP ENDOSCOPY;  Surgeon: [name], MD;  Location: [facility];  Service: ENT;  Laterality: N/A;   FOOT SURGERY Left 09/24/2007   IMPLANTATION OF HYPOGLOSSAL NERVE STIMULATOR Right 12/18/2021   Procedure: IMPLANTATION OF HYPOGLOSSAL NERVE STIMULATOR;  Surgeon: [name], MD;  Location: [facility];  Service: ENT;  Laterality: Right;   KNEE ARTHROPLASTY  09/23/2005   right   OVARY SURGERY  09/24/1999   TUBAL LIGATION  09/23/1970   VAGINAL  HYSTERECTOMY  09/24/1971    MEDICATIONS: No current facility-administered medications for this encounter.    albuterol (PROAIR HFA) 108 (90 Base) MCG/ACT inhaler   Ascorbic Acid (VITAMIN C PO)   aspirin EC 81 MG tablet   b complex vitamins capsule   chlorthalidone (HYGROTON) 25 MG tablet   Cholecalciferol (VITAMIN D3) 75 MCG (3000 UT) TABS   estradiol (VIVELLE-DOT) 0.05 MG/24HR patch   ezetimibe (ZETIA) 10 MG tablet   fluticasone (FLONASE) 50 MCG/ACT nasal spray   irbesartan (AVAPRO) 300 MG tablet   Magnesium Oxide (MAG-OXIDE PO)   montelukast (SINGULAIR) 10 MG tablet   Multiple Vitamin (MULTI-VITAMINS) TABS   Multiple Vitamins-Minerals (LUTEIN-ZEAXANTHIN PO)   Omega-3 Fatty Acids (OMEGA 3 PO)   omeprazole (PRILOSEC) 20 MG capsule   OVER THE COUNTER MEDICATION   PRALUENT 75 MG/ML SOAJ   predniSONE (DELTASONE) 5 MG tablet   sotalol (BETAPACE) 80 MG tablet   venlafaxine XR (EFFEXOR-XR) 150 MG 24 hr capsule    Myra Gianotti, PA-C Surgical Short Stay/Anesthesiology [facility] Phone [phone] [facility] Phone [phone] 10/07/2022 1:52 PM

## 2022-10-08 ENCOUNTER — Ambulatory Visit (HOSPITAL_COMMUNITY): Payer: Medicare HMO | Admitting: Vascular Surgery

## 2022-10-08 ENCOUNTER — Ambulatory Visit (HOSPITAL_COMMUNITY): Payer: Medicare HMO

## 2022-10-08 ENCOUNTER — Ambulatory Visit (HOSPITAL_COMMUNITY)
Admission: RE | Admit: 2022-10-08 | Discharge: 2022-10-08 | Disposition: A | Discharge status: Home / Self Care | Payer: Medicare HMO | Attending: Surgery | Admitting: Surgery

## 2022-10-08 ENCOUNTER — Encounter (HOSPITAL_COMMUNITY): Payer: Self-pay | Admitting: Surgery

## 2022-10-08 ENCOUNTER — Encounter (HOSPITAL_COMMUNITY)
Admission: RE | Disposition: A | Discharge status: Home / Self Care | Payer: Self-pay | Source: Home / Self Care | Attending: Surgery

## 2022-10-08 ENCOUNTER — Other Ambulatory Visit: Payer: Self-pay

## 2022-10-08 ENCOUNTER — Ambulatory Visit (HOSPITAL_BASED_OUTPATIENT_CLINIC_OR_DEPARTMENT_OTHER): Payer: Medicare HMO | Admitting: Vascular Surgery

## 2022-10-08 DIAGNOSIS — Z7952 Long term (current) use of systemic steroids: Secondary | ICD-10-CM | POA: Insufficient documentation

## 2022-10-08 DIAGNOSIS — M353 Polymyalgia rheumatica: Secondary | ICD-10-CM | POA: Insufficient documentation

## 2022-10-08 DIAGNOSIS — K219 Gastro-esophageal reflux disease without esophagitis: Secondary | ICD-10-CM | POA: Insufficient documentation

## 2022-10-08 DIAGNOSIS — I10 Essential (primary) hypertension: Secondary | ICD-10-CM | POA: Insufficient documentation

## 2022-10-08 DIAGNOSIS — Z87891 Personal history of nicotine dependence: Secondary | ICD-10-CM | POA: Insufficient documentation

## 2022-10-08 DIAGNOSIS — K8010 Calculus of gallbladder with chronic cholecystitis without obstruction: Secondary | ICD-10-CM | POA: Insufficient documentation

## 2022-10-08 DIAGNOSIS — I4891 Unspecified atrial fibrillation: Secondary | ICD-10-CM | POA: Diagnosis not present

## 2022-10-08 DIAGNOSIS — K838 Other specified diseases of biliary tract: Secondary | ICD-10-CM | POA: Diagnosis not present

## 2022-10-08 DIAGNOSIS — J45909 Unspecified asthma, uncomplicated: Secondary | ICD-10-CM

## 2022-10-08 DIAGNOSIS — E119 Type 2 diabetes mellitus without complications: Secondary | ICD-10-CM | POA: Diagnosis not present

## 2022-10-08 DIAGNOSIS — K8018 Calculus of gallbladder with other cholecystitis without obstruction: Secondary | ICD-10-CM | POA: Diagnosis not present

## 2022-10-08 DIAGNOSIS — G4733 Obstructive sleep apnea (adult) (pediatric): Secondary | ICD-10-CM | POA: Diagnosis not present

## 2022-10-08 HISTORY — DX: Cardiac arrhythmia, unspecified: I49.9

## 2022-10-08 HISTORY — PX: CHOLECYSTECTOMY: SHX55

## 2022-10-08 HISTORY — DX: Anemia, unspecified: D64.9

## 2022-10-08 HISTORY — PX: INTRAOPERATIVE CHOLANGIOGRAM: SHX5230

## 2022-10-08 LAB — CBC
HCT: 39.5 % (ref 36.0–46.0)
Hemoglobin: 13.9 g/dL (ref 12.0–15.0)
MCH: 33.7 pg (ref 26.0–34.0)
MCHC: 35.2 g/dL (ref 30.0–36.0)
MCV: 95.6 fL (ref 80.0–100.0)
Platelets: 197 10*3/uL (ref 150–400)
RBC: 4.13 MIL/uL (ref 3.87–5.11)
RDW: 13.2 % (ref 11.5–15.5)
WBC: 8.1 10*3/uL (ref 4.0–10.5)
nRBC: 0 % (ref 0.0–0.2)

## 2022-10-08 LAB — BASIC METABOLIC PANEL
Anion gap: 13 (ref 5–15)
BUN: 20 mg/dL (ref 8–23)
CO2: 24 mmol/L (ref 22–32)
Calcium: 9.2 mg/dL (ref 8.9–10.3)
Chloride: 100 mmol/L (ref 98–111)
Creatinine, Ser: 0.86 mg/dL (ref 0.44–1.00)
GFR, Estimated: >60 mL/min (ref >60)
Glucose, Bld: 101 mg/dL — ABNORMAL HIGH (ref 70–99)
Potassium: 3.4 mmol/L — ABNORMAL LOW (ref 3.5–5.1)
Sodium: 137 mmol/L (ref 135–145)

## 2022-10-08 SURGERY — LAPAROSCOPIC CHOLECYSTECTOMY WITH INTRAOPERATIVE CHOLANGIOGRAM
Anesthesia: General | Site: Abdomen

## 2022-10-08 MED ORDER — SODIUM CHLORIDE 0.9 % IV SOLN
INTRAVENOUS | Status: DC | PRN
  Administered 2022-10-08: 10 mL

## 2022-10-08 MED ORDER — OXYCODONE HCL 5 MG PO TABS: 5.0000 mg | ORAL_TABLET | Freq: Once | ORAL | Status: DC | PRN

## 2022-10-08 MED ORDER — SODIUM CHLORIDE 0.9 % IR SOLN
Status: DC | PRN
  Administered 2022-10-08: 1000 mL

## 2022-10-08 MED ORDER — ROCURONIUM BROMIDE 10 MG/ML (PF) SYRINGE
PREFILLED_SYRINGE | INTRAVENOUS | Status: DC | PRN
  Administered 2022-10-08: 60 mg via INTRAVENOUS

## 2022-10-08 MED ORDER — CIPROFLOXACIN IN D5W 400 MG/200ML IV SOLN
400.0000 mg | INTRAVENOUS | Status: AC
  Administered 2022-10-08: 400 mg via INTRAVENOUS
  Filled 2022-10-08: qty 200

## 2022-10-08 MED ORDER — ACETAMINOPHEN 10 MG/ML IV SOLN: 1000.0000 mg | Freq: Once | INTRAVENOUS | Status: DC | PRN

## 2022-10-08 MED ORDER — ACETAMINOPHEN 500 MG PO TABS
1000.0000 mg | ORAL_TABLET | ORAL | Status: AC
  Administered 2022-10-08: 1000 mg via ORAL
  Filled 2022-10-08: qty 2

## 2022-10-08 MED ORDER — CHLORHEXIDINE GLUCONATE CLOTH 2 % EX PADS: 6.0000 | MEDICATED_PAD | Freq: Once | CUTANEOUS | Status: DC

## 2022-10-08 MED ORDER — CHLORHEXIDINE GLUCONATE 0.12 % MT SOLN
15.0000 mL | OROMUCOSAL | Status: AC
  Administered 2022-10-08: 15 mL via OROMUCOSAL
  Filled 2022-10-08: qty 15

## 2022-10-08 MED ORDER — BUPIVACAINE HCL 0.25 % IJ SOLN
INTRAMUSCULAR | Status: DC | PRN
  Administered 2022-10-08: 17 mL

## 2022-10-08 MED ORDER — OXYCODONE HCL 5 MG/5ML PO SOLN: 5.0000 mg | Freq: Once | ORAL | Status: DC | PRN

## 2022-10-08 MED ORDER — ACETAMINOPHEN 325 MG PO TABS: 325.0000 mg | ORAL_TABLET | ORAL | Status: DC | PRN

## 2022-10-08 MED ORDER — FENTANYL CITRATE (PF) 250 MCG/5ML IJ SOLN
INTRAMUSCULAR | Status: AC
  Filled 2022-10-08: qty 5

## 2022-10-08 MED ORDER — LACTATED RINGERS IV SOLN: INTRAVENOUS | Status: DC

## 2022-10-08 MED ORDER — PROMETHAZINE HCL 25 MG/ML IJ SOLN: 6.2500 mg | INTRAMUSCULAR | Status: DC | PRN

## 2022-10-08 MED ORDER — ACETAMINOPHEN 160 MG/5ML PO SOLN: 325.0000 mg | ORAL | Status: DC | PRN

## 2022-10-08 MED ORDER — PROPOFOL 10 MG/ML IV BOLUS
INTRAVENOUS | Status: DC | PRN
  Administered 2022-10-08: 120 mg via INTRAVENOUS

## 2022-10-08 MED ORDER — EPHEDRINE SULFATE-NACL 50-0.9 MG/10ML-% IV SOSY
PREFILLED_SYRINGE | INTRAVENOUS | Status: DC | PRN
  Administered 2022-10-08: 5 mg via INTRAVENOUS

## 2022-10-08 MED ORDER — DEXAMETHASONE SODIUM PHOSPHATE 10 MG/ML IJ SOLN
INTRAMUSCULAR | Status: DC | PRN
  Administered 2022-10-08: 5 mg via INTRAVENOUS

## 2022-10-08 MED ORDER — LIDOCAINE 2% (20 MG/ML) 5 ML SYRINGE
INTRAMUSCULAR | Status: DC | PRN
  Administered 2022-10-08: 80 mg via INTRAVENOUS

## 2022-10-08 MED ORDER — LACTATED RINGERS IV SOLN: INTRAVENOUS | Status: DC | PRN

## 2022-10-08 MED ORDER — BUPIVACAINE HCL (PF) 0.25 % IJ SOLN
INTRAMUSCULAR | Status: AC
  Filled 2022-10-08: qty 30

## 2022-10-08 MED ORDER — SUCCINYLCHOLINE CHLORIDE 200 MG/10ML IV SOSY: PREFILLED_SYRINGE | INTRAVENOUS | Status: DC | PRN

## 2022-10-08 MED ORDER — 0.9 % SODIUM CHLORIDE (POUR BTL) OPTIME
TOPICAL | Status: DC | PRN
  Administered 2022-10-08: 1000 mL

## 2022-10-08 MED ORDER — ONDANSETRON HCL 4 MG/2ML IJ SOLN
INTRAMUSCULAR | Status: DC | PRN
  Administered 2022-10-08: 4 mg via INTRAVENOUS

## 2022-10-08 MED ORDER — FENTANYL CITRATE (PF) 250 MCG/5ML IJ SOLN
INTRAMUSCULAR | Status: DC | PRN
  Administered 2022-10-08: 50 ug via INTRAVENOUS
  Administered 2022-10-08: 25 ug via INTRAVENOUS
  Administered 2022-10-08: 50 ug via INTRAVENOUS
  Administered 2022-10-08: 75 ug via INTRAVENOUS

## 2022-10-08 MED ORDER — AMISULPRIDE (ANTIEMETIC) 5 MG/2ML IV SOLN: 10.0000 mg | Freq: Once | INTRAVENOUS | Status: DC | PRN

## 2022-10-08 MED ORDER — FENTANYL CITRATE (PF) 100 MCG/2ML IJ SOLN: 25.0000 ug | INTRAMUSCULAR | Status: DC | PRN

## 2022-10-08 SURGICAL SUPPLY — 43 items
APPLIER CLIP ROT 10 11.4 M/L (STAPLE) ×1
BAG COUNTER SPONGE SURGICOUNT (BAG) ×1 IMPLANT
BENZOIN TINCTURE PRP APPL 2/3 (GAUZE/BANDAGES/DRESSINGS) ×1 IMPLANT
BLADE CLIPPER SURG (BLADE) IMPLANT
CANISTER SUCT 3000ML PPV (MISCELLANEOUS) ×1 IMPLANT
CHLORAPREP W/TINT 26 (MISCELLANEOUS) ×1 IMPLANT
CLIP APPLIE ROT 10 11.4 M/L (STAPLE) ×1 IMPLANT
CLSR STERI-STRIP ANTIMIC 1/2X4 (GAUZE/BANDAGES/DRESSINGS) IMPLANT
COVER MAYO STAND STRL (DRAPES) ×1 IMPLANT
COVER SURGICAL LIGHT HANDLE (MISCELLANEOUS) ×1 IMPLANT
DRAPE C-ARM 42X120 X-RAY (DRAPES) ×1 IMPLANT
DRSG TEGADERM 2-3/8X2-3/4 SM (GAUZE/BANDAGES/DRESSINGS) ×3 IMPLANT
DRSG TEGADERM 4X4.75 (GAUZE/BANDAGES/DRESSINGS) ×1 IMPLANT
ELECT REM PT RETURN 9FT ADLT (ELECTROSURGICAL) ×1
ELECTRODE REM PT RTRN 9FT ADLT (ELECTROSURGICAL) ×1 IMPLANT
GAUZE SPONGE 2X2 8PLY STRL LF (GAUZE/BANDAGES/DRESSINGS) ×1 IMPLANT
GAUZE SPONGE 2X2 STRL 8-PLY (GAUZE/BANDAGES/DRESSINGS) IMPLANT
GLOVE BIO SURGEON STRL SZ7 (GLOVE) ×1 IMPLANT
GLOVE BIOGEL PI IND STRL 7.5 (GLOVE) ×1 IMPLANT
GOWN STRL REUS W/ TWL LRG LVL3 (GOWN DISPOSABLE) ×3 IMPLANT
GOWN STRL REUS W/TWL LRG LVL3 (GOWN DISPOSABLE) ×3
KIT BASIN OR (CUSTOM PROCEDURE TRAY) ×1 IMPLANT
KIT TURNOVER KIT B (KITS) ×1 IMPLANT
NS IRRIG 1000ML POUR BTL (IV SOLUTION) ×1 IMPLANT
PAD ARMBOARD 7.5X6 YLW CONV (MISCELLANEOUS) ×1 IMPLANT
POUCH RETRIEVAL ECOSAC 10 (ENDOMECHANICALS) IMPLANT
POUCH RETRIEVAL ECOSAC 10MM (ENDOMECHANICALS) ×1
POUCH SPECIMEN RETRIEVAL 10MM (ENDOMECHANICALS) IMPLANT
SCISSORS LAP 5X35 DISP (ENDOMECHANICALS) ×1 IMPLANT
SET CHOLANGIOGRAPH 5 50 .035 (SET/KITS/TRAYS/PACK) ×1 IMPLANT
SET IRRIG TUBING LAPAROSCOPIC (IRRIGATION / IRRIGATOR) ×1 IMPLANT
SET TUBE SMOKE EVAC HIGH FLOW (TUBING) ×1 IMPLANT
SLEEVE ENDOPATH XCEL 5M (ENDOMECHANICALS) ×1 IMPLANT
SPECIMEN JAR SMALL (MISCELLANEOUS) ×1 IMPLANT
STRIP CLOSURE SKIN 1/2X4 (GAUZE/BANDAGES/DRESSINGS) ×1 IMPLANT
SUT MNCRL AB 4-0 PS2 18 (SUTURE) ×1 IMPLANT
TOWEL GREEN STERILE (TOWEL DISPOSABLE) ×1 IMPLANT
TOWEL GREEN STERILE FF (TOWEL DISPOSABLE) ×1 IMPLANT
TRAY LAPAROSCOPIC MC (CUSTOM PROCEDURE TRAY) ×1 IMPLANT
TROCAR XCEL BLUNT TIP 100MML (ENDOMECHANICALS) ×1 IMPLANT
TROCAR XCEL NON-BLD 11X100MML (ENDOMECHANICALS) ×1 IMPLANT
TROCAR Z-THREAD OPTICAL 5X100M (TROCAR) ×1 IMPLANT
WATER STERILE IRR 1000ML POUR (IV SOLUTION) ×1 IMPLANT

## 2022-10-08 NOTE — Op Note (Signed)
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 The patient was on Ozempic for 2 years and had a 45 pound weight loss. While she was on Ozempic, she began having irregular bowel movements, alternating between diarrhea and constipation. She also would have regular episodes of nausea and bloating. She also began having episodes of right-sided abdominal pain radiating around to her flank. She underwent a CT scan that was unremarkable. An ultrasound earlier this year revealed a small gallstone measuring 7 mm. She had liver function test that were normal. The patient has stopped taking Ozempic for the last 2 months. Her symptoms are improved. She comes in today for elective cholecystectomy  Pre-operative Diagnosis: Calculus of gallbladder with other cholecystitis, without mention of obstruction  Post-operative Diagnosis: Same  Surgeon: Maia Petties   Resident:  Dr. Coralyn Pear Economopoulos  Anesthesia: General endotracheal anesthesia  ASA Class: 1  Procedure Details  The patient was seen again in the Holding Room. The risks, benefits, complications, treatment options, and expected outcomes were discussed with the patient. The possibilities of reaction to medication, pulmonary aspiration, perforation of viscus, bleeding, recurrent infection, finding a normal gallbladder, the need for additional procedures, failure to diagnose a condition, the possible need to convert to an open procedure, and creating a complication requiring transfusion or operation were discussed with the patient. The likelihood of improving the patient's symptoms with return to their baseline status is good.  The patient and/or family concurred with the proposed plan, giving informed consent. The site of surgery properly noted. The patient was taken to  Operating Room, identified as [name] and the procedure verified as Laparoscopic Cholecystectomy with Intraoperative Cholangiogram. A Time Out was held and the above information confirmed.  Prior to the induction of general anesthesia, antibiotic prophylaxis was administered. General endotracheal anesthesia was then administered and tolerated well. After the induction, the abdomen was prepped with Chloraprep and draped in the sterile fashion. The patient was positioned in the supine position.  Local anesthetic agent was injected into the skin near the umbilicus and an incision made. We dissected down to the abdominal fascia with blunt dissection.  The fascia was incised vertically and we entered the peritoneal cavity bluntly.  A pursestring suture of 0-Vicryl was placed around the fascial opening.  The Hasson cannula was inserted and secured with the stay suture.  Pneumoperitoneum was then created with CO2 and tolerated well without any adverse changes in the patient's vital signs. An 11-mm port was placed in the subxiphoid position.  Two 5-mm ports were placed in the right upper quadrant. All skin incisions were infiltrated with a local anesthetic agent before making the incision and placing the trocars.   We positioned the patient in reverse Trendelenburg, tilted slightly to the patient's left.  The gallbladder was identified, the fundus grasped and retracted cephalad. The gallbladder is quite thin-walled and a small hole was inadvertently created in the fundus with some bile spillage.  No stones were spilled.  Adhesions were lysed bluntly and with the electrocautery where indicated, taking care not to injure any adjacent organs or viscus. The infundibulum was grasped and retracted laterally, exposing the peritoneum overlying the triangle of Calot. This was then divided and exposed in a blunt fashion. A critical view of the cystic duct and cystic artery was obtained.  The cystic duct was clearly  identified and bluntly dissected circumferentially. The cystic duct was ligated with a clip distally.   An incision was made in the cystic duct and the [facility] cholangiogram catheter introduced. The catheter was secured using a clip. A cholangiogram was then obtained which showed good visualization of the distal and proximal biliary tree with no sign of filling defects or obstruction.  Contrast flowed easily into the duodenum. The catheter was then removed.   The cystic duct was then ligated with clips and divided. The cystic artery was identified, dissected free, ligated with clips and divided as well.   The gallbladder was dissected from the liver bed in retrograde fashion with the electrocautery. The gallbladder was removed and placed in an Eco sac. The liver bed was irrigated and inspected. Hemostasis was achieved with the electrocautery. Copious irrigation was utilized and was repeatedly aspirated until clear.  The gallbladder and Eco sac were then removed through the umbilical port site.  The pursestring suture was used to close the umbilical fascia.    We again inspected the right upper quadrant for hemostasis.  Pneumoperitoneum was released as we removed the trocars.  4-0 Monocryl was used to close the skin.   Benzoin, steri-strips, and clean dressings were applied. The patient was then extubated and brought to the recovery room in stable condition. Instrument, sponge, and needle counts were correct at closure and at the conclusion of the case.   Findings: Cholecystitis with Cholelithiasis  Estimated Blood Loss: Minimal         Drains: none         Specimens: Gallbladder           Complications: None; patient tolerated the procedure well.         Disposition: PACU - hemodynamically stable.         Condition: stable  Imogene Burn. Georgette Dover, MD, [facility] Surgery  General Surgery   10/08/2022 12:28 PM

## 2022-10-08 NOTE — H&P (Signed)
Subjective    Chief Complaint: New Consultation (gallbladder)       History of Present Illness: [name] [name] is a 77 y.o. female who is seen today as an office consultation at the request of Dr. Dagmar Hait for evaluation of New Consultation (gallbladder) .   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  The patient was on Ozempic for 2 years and had a 45 pound weight loss.  While she was on Ozempic, she began having irregular bowel movements, alternating between diarrhea and constipation.  She also would have regular episodes of nausea and bloating.  She also began having episodes of right-sided abdominal pain radiating around to her flank.  She underwent a CT scan that was unremarkable.  An ultrasound earlier this year revealed a small gallstone measuring 7 mm.  She had liver function test that were normal.  The patient has stopped taking Ozempic for the last 2 months.  Her symptoms are improved.  She comes in today to discuss possible elective cholecystectomy.   She has an inspire implant for sleep apnea. She is on chronic prednisone for PMR.   Review of Systems: A complete review of systems was obtained from the patient.  I have reviewed this information and discussed as appropriate with the patient.  See HPI as well for other ROS.   Review of Systems  Constitutional: Negative.   HENT: Negative.    Eyes: Negative.   Respiratory: Negative.    Cardiovascular: Negative.   Gastrointestinal:  Positive for heartburn.  Genitourinary: Negative.   Musculoskeletal: Negative.   Skin:  Positive for itching.  Neurological: Negative.   Endo/Heme/Allergies:  Bruises/bleeds easily.  Psychiatric/Behavioral: Negative.          Medical History:     Past Medical History:  Diagnosis Date   Arrhythmia     GERD (gastroesophageal reflux disease)     Hyperlipidemia     Hypertension      Sleep apnea           Patient Active Problem List  Diagnosis   Acid reflux   Asthma, mild intermittent   Atrial fibrillation (CMS-HCC)   BMI 30.0-30.9,adult   HTN (hypertension)   HLD (hyperlipidemia)   Irritable bowel syndrome   Obstructive sleep apnea (adult) (pediatric)   PMR (polymyalgia rheumatica) (CMS-HCC)           Past Surgical History:  Procedure Laterality Date   Implantation of hypoglossal nerve stimulator (Right)   12/18/2021   HYSTERECTOMY       left foot surgery               Allergies  Allergen Reactions   Cephalosporins Other (See Comments)      'ran a fever"   Nickel Other (See Comments) and Rash   Sulfa (Sulfonamide Antibiotics) Hives and Other (See Comments)      hives   Apixaban Hives and Itching   Rivaroxaban Hives and Itching            Current Outpatient Medications on File Prior to Visit  Medication Sig Dispense Refill   chlorthalidone 25 MG tablet TAKE 1 TABLET BY MOUTH EVERY DAY IN THE MORNING WITH FOOD       ezetimibe (ZETIA) 10 mg tablet Take 1 tablet by mouth once daily       irbesartan (AVAPRO) 300 MG tablet Take by mouth  montelukast (SINGULAIR) 10 mg tablet TAKE 1 TABLET BY MOUTH EVERY DAY FOR COUGH OR SINUS CONGESTION       omeprazole (PRILOSEC) 20 MG DR capsule Take 20 mg by mouth once daily       predniSONE (DELTASONE) 5 MG tablet Take by mouth       sotaloL (BETAPACE) 80 MG tablet Take 80 mg by mouth 2 (two) times daily       venlafaxine (EFFEXOR-XR) 150 MG XR capsule Take 150 mg by mouth once daily        No current facility-administered medications on file prior to visit.           Family History  Problem Relation Age of Onset   High blood pressure (Hypertension) Mother     Obesity Sister     High blood pressure (Hypertension) Sister        Social History        Tobacco Use  Smoking Status Former   Types: Cigarettes   Quit date: 1981   Years since quitting: 42.9  Smokeless Tobacco Never      Social  History         Socioeconomic History   Marital status: Married  Tobacco Use   Smoking status: Former      Types: Cigarettes      Quit date: 1981      Years since quitting: 42.9   Smokeless tobacco: Never  Vaping Use   Vaping Use: Never used  Substance and Sexual Activity   Alcohol use: Yes      Alcohol/week: 2.0 standard drinks of alcohol      Types: 2 Glasses of wine per week   Drug use: Never      Objective:         Vitals:    08/19/22 0926  BP: 126/74  Pulse: 61  SpO2: 95%  Weight: 81.8 kg (180 lb 6.4 oz)  Height: 162.6 cm ('5\' 4"'$ )    Body mass index is 30.97 kg/m.   Physical Exam    Constitutional:  WDWN in NAD, conversant, no obvious deformities; lying in bed comfortably Eyes:  Pupils equal, round; sclera anicteric; moist conjunctiva; no lid lag HENT:  Oral mucosa moist; good dentition  Neck:  No masses palpated, trachea midline; no thyromegaly Lungs:  CTA bilaterally; normal respiratory effort CV:  Regular rate and rhythm; no murmurs; extremities well-perfused with no edema Abd:  +bowel sounds, soft, non-tender, no palpable organomegaly; small umbilical hernia Musc:  Normal gait; no apparent clubbing or cyanosis in extremities Lymphatic:  No palpable cervical or axillary lymphadenopathy Skin:  Warm, dry; no sign of jaundice Psychiatric - alert and oriented x 4; calm mood and affect     Labs, Imaging and Diagnostic Testing: CLINICAL DATA:  Nausea and vomiting   EXAM: ULTRASOUND ABDOMEN LIMITED RIGHT UPPER QUADRANT   COMPARISON:  CT abdomen pelvis June 04, 2022   FINDINGS: Gallbladder:   Cholelithiasis. Largest stone measures 7 mm. No gallbladder wall thickening or pericholecystic fluid. Negative sonographic Murphy's sign.   Common bile duct:   Diameter: 2.8 mm   Liver:   Diffusely increased in echogenicity. No focal lesion. Portal vein is patent on color Doppler imaging with normal direction of blood flow towards the liver.   Other:  None.   IMPRESSION: Cholelithiasis without secondary signs of acute cholecystitis.   Increased hepatic parenchymal echogenicity suggestive of steatosis.     Electronically Signed   By: Lovey Newcomer M.D.   On:  06/26/2022 09:51   CLINICAL DATA:  Right flank pain. Nausea, vomiting and diarrhea over the last 2 weeks.   EXAM: CT ABDOMEN AND PELVIS WITH CONTRAST   TECHNIQUE: Multidetector CT imaging of the abdomen and pelvis was performed using the standard protocol following bolus administration of intravenous contrast.   RADIATION DOSE REDUCTION: This exam was performed according to the departmental dose-optimization program which includes automated exposure control, adjustment of the mA and/or kV according to patient size and/or use of iterative reconstruction technique.   CONTRAST:  137m ISOVUE-300 IOPAMIDOL (ISOVUE-300) INJECTION 61%   COMPARISON:  03/09/2015   FINDINGS: Lower chest: Minimal linear scarring at the lung bases.   Hepatobiliary: Liver parenchyma is normal. Minimal fatty change adjacent to the falciform ligament, a frequent normal variant. No calcified stones.   Pancreas: Normal   Spleen: Normal   Adrenals/Urinary Tract: Adrenal glands are normal. Kidneys are normal. No mass, stone or hydronephrosis. Bladder is normal.   Stomach/Bowel: The stomach and small intestine are normal. No visible appendix. No evidence of inflammation in the right lower quadrant. The colon appears otherwise normal.   Vascular/Lymphatic: Aortic atherosclerosis. No aneurysm. IVC is normal. No adenopathy.   Reproductive: Previous hysterectomy.  No pelvic mass.   Other: No free fluid or air.   Musculoskeletal: Ordinary mild lower lumbar degenerative changes.   IMPRESSION: No abnormality seen to explain the clinical presentation. No evidence of urinary tract stone disease. No evidence of bowel or other solid organ pathology. No visible appendix. Previous appendectomy  assumed.     Electronically Signed   By: MNelson ChimesM.D.   On: 06/04/2022 15:31   Assessment and Plan:  Diagnoses and all orders for this visit:   Calculus of gallbladder with chronic cholecystitis without obstruction     Her symptoms seem much improved since stopping the Ozempic, but she would like to proceed with laparoscopic cholecystectomy.  Recommend laparoscopic cholecystectomy with intraoperative cholangiogram.The surgical procedure has been discussed with the patient.  Potential risks, benefits, alternative treatments, and expected outcomes have been explained.  All of the patient's questions at this time have been answered.  The likelihood of reaching the patient's treatment goal is good.  The patient understand the proposed surgical procedure and wishes to proceed.    MImogene Burn TGeorgette Dover MD, FTexas [facility] HospitalSurgery  General Surgery   10/08/2022 9:13 AM

## 2022-10-08 NOTE — Transfer of Care (Signed)
Immediate Anesthesia Transfer of Care Note  Patient: [name] [name]  Procedure(s) Performed: LAPAROSCOPIC CHOLECYSTECTOMY (Abdomen) INTRAOPERATIVE CHOLANGIOGRAM (Abdomen)  Patient Location: PACU  Anesthesia Type:General  Level of Consciousness: awake and alert   Airway & Oxygen Therapy: Patient Spontanous Breathing and Patient connected to nasal cannula oxygen  Post-op Assessment: Report given to RN and Post -op Vital signs reviewed and stable  Post vital signs: Reviewed and stable  Last Vitals:  Vitals Value Taken Time  BP 152/63 10/08/22 1245  Temp 36.4 C 10/08/22 1245  Pulse 71 10/08/22 1249  Resp 17 10/08/22 1249  SpO2 92 % 10/08/22 1249  Vitals shown include unvalidated device data.  Last Pain:  Vitals:   10/08/22 0912  TempSrc:   PainSc: 0-No pain      Patients Stated Pain Goal: 0 (04/88/89 1694)  Complications: No notable events documented.

## 2022-10-08 NOTE — Anesthesia Procedure Notes (Signed)
Procedure Name: Intubation Date/Time: 10/08/2022 12:00 PM  Performed by: Timoteo Expose, CRNAPre-anesthesia Checklist: Patient identified, Emergency Drugs available, Suction available and Patient being monitored Patient Re-evaluated:Patient Re-evaluated prior to induction Oxygen Delivery Method: Circle system utilized Preoxygenation: Pre-oxygenation with 100% oxygen Induction Type: IV induction Ventilation: Mask ventilation without difficulty Laryngoscope Size: Mac and 3 Grade View: Grade I Tube type: Oral Tube size: 7.0 mm Number of attempts: 1 Airway Equipment and Method: Stylet and Oral airway Placement Confirmation: ETT inserted through vocal cords under direct vision, positive ETCO2 and breath sounds checked- equal and bilateral Tube secured with: Tape Dental Injury: Teeth and Oropharynx as per pre-operative assessment

## 2022-10-08 NOTE — Discharge Instructions (Signed)
[facility] ______[facility] LAPAROSCOPIC SURGERY: POST OP INSTRUCTIONS Always review your discharge instruction sheet given to you by the facility where your surgery was performed. IF YOU HAVE DISABILITY OR FAMILY LEAVE FORMS, YOU MUST BRING THEM TO THE OFFICE FOR PROCESSING.   DO NOT GIVE THEM TO YOUR DOCTOR.  A prescription for pain medication may be given to you upon discharge.  Take your pain medication as prescribed, if needed.  If narcotic pain medicine is not needed, then you may take acetaminophen (Tylenol) or ibuprofen (Advil) as needed. Take your usually prescribed medications unless otherwise directed. If you need a refill on your pain medication, please contact your pharmacy.  They will contact our office to request authorization. Prescriptions will not be filled after 5pm or on week-ends. You should follow a light diet the first few days after arrival home, such as soup and crackers, etc.  Be sure to include lots of fluids daily. Most patients will experience some swelling and bruising in the area of the incisions.  Ice packs will help.  Swelling and bruising can take several days to resolve.  It is common to experience some constipation if taking pain medication after surgery.  Increasing fluid intake and taking a stool softener (such as Colace) will usually help or prevent this problem from occurring.  A mild laxative (Milk of Magnesia or Miralax) should be taken according to package instructions if there are no bowel movements after 48 hours. Unless discharge instructions indicate otherwise, you may remove your bandages 24-48 hours after surgery, and you may shower at that time.  You may have steri-strips (small skin tapes) in place directly over the incision.  These strips should be left on the skin for 7-10 days.  If your surgeon used skin glue on the incision, you may shower in 24 hours.  The glue will flake off over the next 2-3 weeks.  Any sutures or staples will be  removed at the office during your follow-up visit. ACTIVITIES:  You may resume regular (light) daily activities beginning the next day--such as daily self-care, walking, climbing stairs--gradually increasing activities as tolerated.  You may have sexual intercourse when it is comfortable.  Refrain from any heavy lifting or straining until approved by your doctor. You may drive when you are no longer taking prescription pain medication, you can comfortably wear a seatbelt, and you can safely maneuver your car and apply brakes. RETURN TO WORK:  __________________________________________________________ You should see your doctor in the office for a follow-up appointment approximately 2-3 weeks after your surgery.  Make sure that you call for this appointment within a day or two after you arrive home to insure a convenient appointment time. OTHER INSTRUCTIONS: __________________________________________________________________________________________________________________________ __________________________________________________________________________________________________________________________ WHEN TO CALL YOUR DOCTOR: Fever over 101.0 Inability to urinate Continued bleeding from incision. Increased pain, redness, or drainage from the incision. Increasing abdominal pain  The clinic staff is available to answer your questions during regular business hours.  Please don't hesitate to call and ask to speak to one of the nurses for clinical concerns.  If you have a medical emergency, go to the nearest emergency room or call 911.  A surgeon from [facility] is always on call at the hospital. [address], [address], [place], [place]  [address] ? [address] [address], [place],    [address] [phone] ? [phone] ? FAX [phone] Web site: [email or web address]  

## 2022-10-08 NOTE — Progress Notes (Deleted)
[name] [name] Date of Birth: Feb 25, 1946   History of Present Illness: [name] [name] is seen for  followup. She has a history of atrial fibrillation that has been well controlled with sotalol since 2003. Prior use of Toprol caused her to be very fatigued and calcium channel blockers did not help.   She previously developed a bad pruritic rash all over. Skin biopsy was c/w a drug reaction. She was placed on steroids and Xarelto was stopped in favor of Eliquis. Rash did not clear on Eliquis and this was also stopped. Rash finally resolved. Patient given option of Pradaxa but she refused. Now only taking ASA 81 mg daily. She denies any Afib episodes. Steroids now 5 mg daily for PMR. She has Cardiomobile which she carries with her now. She has a history of OSA and is  on CPAP.   She was seen on April 21, 2019 for complaints of palpitations.  Noted more PVCs on her Apple phone but no AFib. A Zio monitor was placed which showed no Afib. PVCs with one brief run of NSVT and several brief runs of SVT. She started taking a magnesium supplement and this resolved her palpitations.  She reports she was diagnosed with type 2 DM. Started on Ozempic. She has lost some weight. Reports no episodes of AFib and PVCs are much less. Not as active now.   Recently seen by Dr Georgette Dover with cholecystitis. Elective surgery planned.   Current Facility-Administered Medications on File Prior to Visit  Medication Dose Route Frequency Provider Last Rate Last Admin   acetaminophen (OFIRMEV) IV 1,000 mg  1,000 mg Intravenous Once PRN Effie Berkshire, MD       acetaminophen (TYLENOL) tablet 325-650 mg  325-650 mg Oral Q4H PRN Effie Berkshire, MD       Or   acetaminophen (TYLENOL) 160 MG/5ML solution 325-650 mg  325-650 mg Oral Q4H PRN Effie Berkshire, MD       amisulpride (BARHEMSYS) injection 10 mg  10 mg Intravenous Once PRN Effie Berkshire, MD       Chlorhexidine Gluconate Cloth 2 % PADS 6 each  6 each Topical Once Donnie Mesa,  MD       And   Chlorhexidine Gluconate Cloth 2 % PADS 6 each  6 each Topical Once Donnie Mesa, MD       fentaNYL (SUBLIMAZE) injection 25-50 mcg  25-50 mcg Intravenous Q5 min PRN Effie Berkshire, MD       lactated ringers infusion   Intravenous Continuous Suzette Battiest, MD 10 mL/hr at 10/08/22 0925 New Bag at 10/08/22 0925   oxyCODONE (Oxy IR/ROXICODONE) immediate release tablet 5 mg  5 mg Oral Once PRN Effie Berkshire, MD       Or   oxyCODONE (ROXICODONE) 5 MG/5ML solution 5 mg  5 mg Oral Once PRN Effie Berkshire, MD       promethazine (PHENERGAN) injection 6.25-12.5 mg  6.25-12.5 mg Intravenous Q15 min PRN Effie Berkshire, MD       Current Outpatient Medications on File Prior to Visit  Medication Sig Dispense Refill   albuterol (PROAIR HFA) 108 (90 Base) MCG/ACT inhaler Inhale 2 puffs into the lungs every 6 (six) hours as needed. (Patient taking differently: Inhale 2 puffs into the lungs every 6 (six) hours as needed for wheezing or shortness of breath.) 1 Inhaler 5   Ascorbic Acid (VITAMIN C PO) Take 1 tablet by mouth daily.     aspirin EC 81  MG tablet Take 81 mg by mouth every 30 (thirty) days.     b complex vitamins capsule Take 1 capsule by mouth daily.     chlorthalidone (HYGROTON) 25 MG tablet Take 1 tablet (25 mg total) by mouth daily. 90 tablet 1   Cholecalciferol (VITAMIN D3) 75 MCG (3000 UT) TABS Take 3,000 Units by mouth daily.     estradiol (VIVELLE-DOT) 0.05 MG/24HR patch Place 1 patch onto the skin 2 (two) times a week.     ezetimibe (ZETIA) 10 MG tablet TAKE 1 TABLET BY MOUTH EVERY DAY 90 tablet 3   fluticasone (FLONASE) 50 MCG/ACT nasal spray Place 2 sprays into both nostrils daily as needed for allergies.     irbesartan (AVAPRO) 300 MG tablet Take 300 mg by mouth daily.     Magnesium Oxide (MAG-OXIDE PO) Take 1 tablet by mouth at bedtime.     montelukast (SINGULAIR) 10 MG tablet Take 10 mg by mouth daily.     Multiple Vitamin (MULTI-VITAMINS) TABS Take 1 tablet by  mouth daily.      Multiple Vitamins-Minerals (LUTEIN-ZEAXANTHIN PO) Take 1 tablet by mouth daily.     Omega-3 Fatty Acids (OMEGA 3 PO) Take 1 capsule by mouth daily.     omeprazole (PRILOSEC) 20 MG capsule Take 20 mg by mouth daily as needed.     OVER THE COUNTER MEDICATION Take 2 tablets by mouth at bedtime. Restful monk sleep supplement (without melatonin)     PRALUENT 75 MG/ML SOAJ Inject 1 mL into the skin every 14 (fourteen) days.     predniSONE (DELTASONE) 5 MG tablet Take 5 mg by mouth daily with breakfast.     sotalol (BETAPACE) 80 MG tablet Take 80 mg by mouth 2 (two) times daily.     venlafaxine XR (EFFEXOR-XR) 150 MG 24 hr capsule Take 150 mg by mouth daily.      Allergies  Allergen Reactions   Atorvastatin Other (See Comments)    Muscle cramps severe      Simvastatin Other (See Comments)    Severe muscle cramps   Cephalosporins     'ran a fever"   Nickel Rash   Sulfa Antibiotics Hives   Sulfasalazine Hives   Eliquis [Apixaban] Hives and Itching   Xarelto [Rivaroxaban] Hives and Itching    Past Medical History:  Diagnosis Date   Anemia    years ago after surgery   Asthma 1976   Atrial fibrillation (HCC)    Bronchitis    Dysrhythmia    A-fib   FUO (fever of unknown origin) 03/03/2015   GERD (gastroesophageal reflux disease)    Hepatitis 1980s   Hepatitis    non A- non B   HTN (hypertension)    Hypercholesterolemia    Night sweat 03/03/2015   PMR (polymyalgia rheumatica) ([place]) 03/15/2015   Polyarthritis 03/03/2015   Polymyalgia ([place]) 03/03/2015   Polymyositis ([place]) 03/03/2015   Sleep apnea     Past Surgical History:  Procedure Laterality Date   ABDOMINAL HYSTERECTOMY     APPENDECTOMY  09/24/1971   DG THUMB RIGHT HAND ([place] [facility])     DRUG INDUCED ENDOSCOPY N/A 11/14/2021   Procedure: DRUG INDUCED SLEEP ENDOSCOPY;  Surgeon: [name], MD;  Location: [facility];  Service: ENT;  Laterality: N/A;   FOOT SURGERY Left 09/24/2007    IMPLANTATION OF HYPOGLOSSAL NERVE STIMULATOR Right 12/18/2021   Procedure: IMPLANTATION OF HYPOGLOSSAL NERVE STIMULATOR;  Surgeon: [name], MD;  Location: [facility];  Service: ENT;  Laterality: Right;   KNEE ARTHROPLASTY  09/23/2005   right   OVARY SURGERY  09/24/1999   TUBAL LIGATION  09/23/1970   VAGINAL HYSTERECTOMY  09/24/1971    Social History   Tobacco Use  Smoking Status Former   Packs/day: 0.20   Years: 5.00   Total pack years: 1.00   Types: Cigarettes   Quit date: 09/23/1980   Years since quitting: 42.0  Smokeless Tobacco Never    Social History   Substance and Sexual Activity  Alcohol Use Yes   Comment: 2 glasses of wine a day    Family History  Problem Relation Age of Onset   Hypertension Sister    Lung cancer Father    Hypertension Mother    Multiple sclerosis Daughter    Breast cancer Other        maternal aunt   Allergies Daughter    Allergies Daughter    Allergies Son     Review of Systems: As noted in history of present illness.  All other systems were reviewed and are negative.  Physical Exam: There were no vitals taken for this visit. GENERAL:  Well appearing, obese WF in NAD HEENT:  PERRL, EOMI, sclera are clear. Oropharynx is clear. NECK:  No jugular venous distention, carotid upstroke brisk and symmetric, no bruits, no thyromegaly or adenopathy LUNGS:  Clear to auscultation bilaterally CHEST:  Unremarkable HEART:  RRR,  PMI not displaced or sustained,S1 and S2 within normal limits, no S3, no S4: no clicks, no rubs, no murmurs ABD:  Soft, nontender. BS +, no masses or bruits. No hepatomegaly, no splenomegaly EXT:  2 + pulses throughout, no edema, no cyanosis no clubbing SKIN:  Warm and dry.  No rashes NEURO:  Alert and oriented x 3. Cranial nerves II through XII intact. PSYCH:  Cognitively intact    LBORATORY DATA:  Lab Results  Component Value Date   WBC 8.1 10/08/2022   HGB 13.9 10/08/2022   HCT 39.5  10/08/2022   PLT 197 10/08/2022   GLUCOSE 101 (H) 10/08/2022   ALT 38 (H) 04/21/2019   AST 37 04/21/2019   NA 137 10/08/2022   K 3.4 (L) 10/08/2022   CL 100 10/08/2022   CREATININE 0.86 10/08/2022   BUN 20 10/08/2022   CO2 24 10/08/2022   Labs dated 02/06/16: cholesterol 236, triglycerides 132, HDL 69, LDL 141. CMET and TSH normal.  November 27/2017: A1c 5.6%. Dated 02/12/17: cholesterol 251, triglycerides 226, HDL 59, LDL 147.  Dated 07/23/17: A1c 5.5% Dated 09/25/17: normal chemistries and TSH. Dated 03/29/19: cholesterol 275, triglycerides 333, HDL 63, LDL 145. LFTs normal.  Dated 11/18/19: Normal BMET. Dated 01/03/20: A1c 5.5% Dated 04/21/20: cholesterol 221, triglycerides 317, HDL 62, LDL 96. Potassium 3.6. CMET, CBC, TSH normal Dated 09/06/20: A1c 5.2%.   Event monitor: Study Highlights 05/12/19   Normal sinus rhythm Rare PVCs, trigeminy One 6 beat run of NSVT Rare PACs. 6 runs of SVT. longest 18 seconds at rate max 152. symptomatic.     Assessment / Plan: 1. Atrial fibrillation, well controlled on sotalol. No episodes in several years. She has a [place] Vasc score of 3. Intolerant of Xarelto and possibly Eliquis due to drug rash. Will continue to monitor with Cardiomobile. Stay on ASA 81 mg daily.   2. Hypertension. Blood pressure is well controlled.   3. PMR on steroids.   4. OSA on CPAP- followed by [name]  5. PVCs and short runs of SVT. Intolerant of  beta blockers and calcium channel blockers in the past.  Symptoms better on magnesium. Continue Sotalol.  Follow up in one year

## 2022-10-08 NOTE — Anesthesia Postprocedure Evaluation (Signed)
Anesthesia Post Note  Patient: [name] [name]  Procedure(s) Performed: LAPAROSCOPIC CHOLECYSTECTOMY (Abdomen) INTRAOPERATIVE CHOLANGIOGRAM (Abdomen)     Patient location during evaluation: PACU Anesthesia Type: General Level of consciousness: awake and alert Pain management: pain level controlled Vital Signs Assessment: post-procedure vital signs reviewed and stable Respiratory status: spontaneous breathing, nonlabored ventilation, respiratory function stable and patient connected to nasal cannula oxygen Cardiovascular status: blood pressure returned to baseline and stable Postop Assessment: no apparent nausea or vomiting Anesthetic complications: no  No notable events documented.  Last Vitals:  Vitals:   10/08/22 1300 10/08/22 1315  BP: (!) 137/50 (!) 138/33  Pulse: 61 (!) 56  Resp: 18 11  Temp:  36.4 C  SpO2: 95% 96%    Last Pain:  Vitals:   10/08/22 1245  TempSrc:   PainSc: 0-No pain                 Tiajuana Amass

## 2022-10-09 ENCOUNTER — Encounter (HOSPITAL_COMMUNITY): Payer: Self-pay | Admitting: Surgery

## 2022-10-09 LAB — SURGICAL PATHOLOGY

## 2022-10-14 ENCOUNTER — Ambulatory Visit: Payer: Medicare Other | Admitting: Cardiology

## 2022-10-15 ENCOUNTER — Encounter (HOSPITAL_BASED_OUTPATIENT_CLINIC_OR_DEPARTMENT_OTHER): Payer: Self-pay | Admitting: Pulmonary Disease

## 2022-10-15 IMAGING — CR DG CHEST 1V PORT
1 series · 1 of 1 positions shown · non-contrast
Comparison: Coronary calcium scoring CT 07/03/2021

CLINICAL DATA: Obstructive sleep apnea. Status post hypoglossal
nerve stimulator placement.

EXAM:
PORTABLE CHEST 1 VIEW

[chest ap]
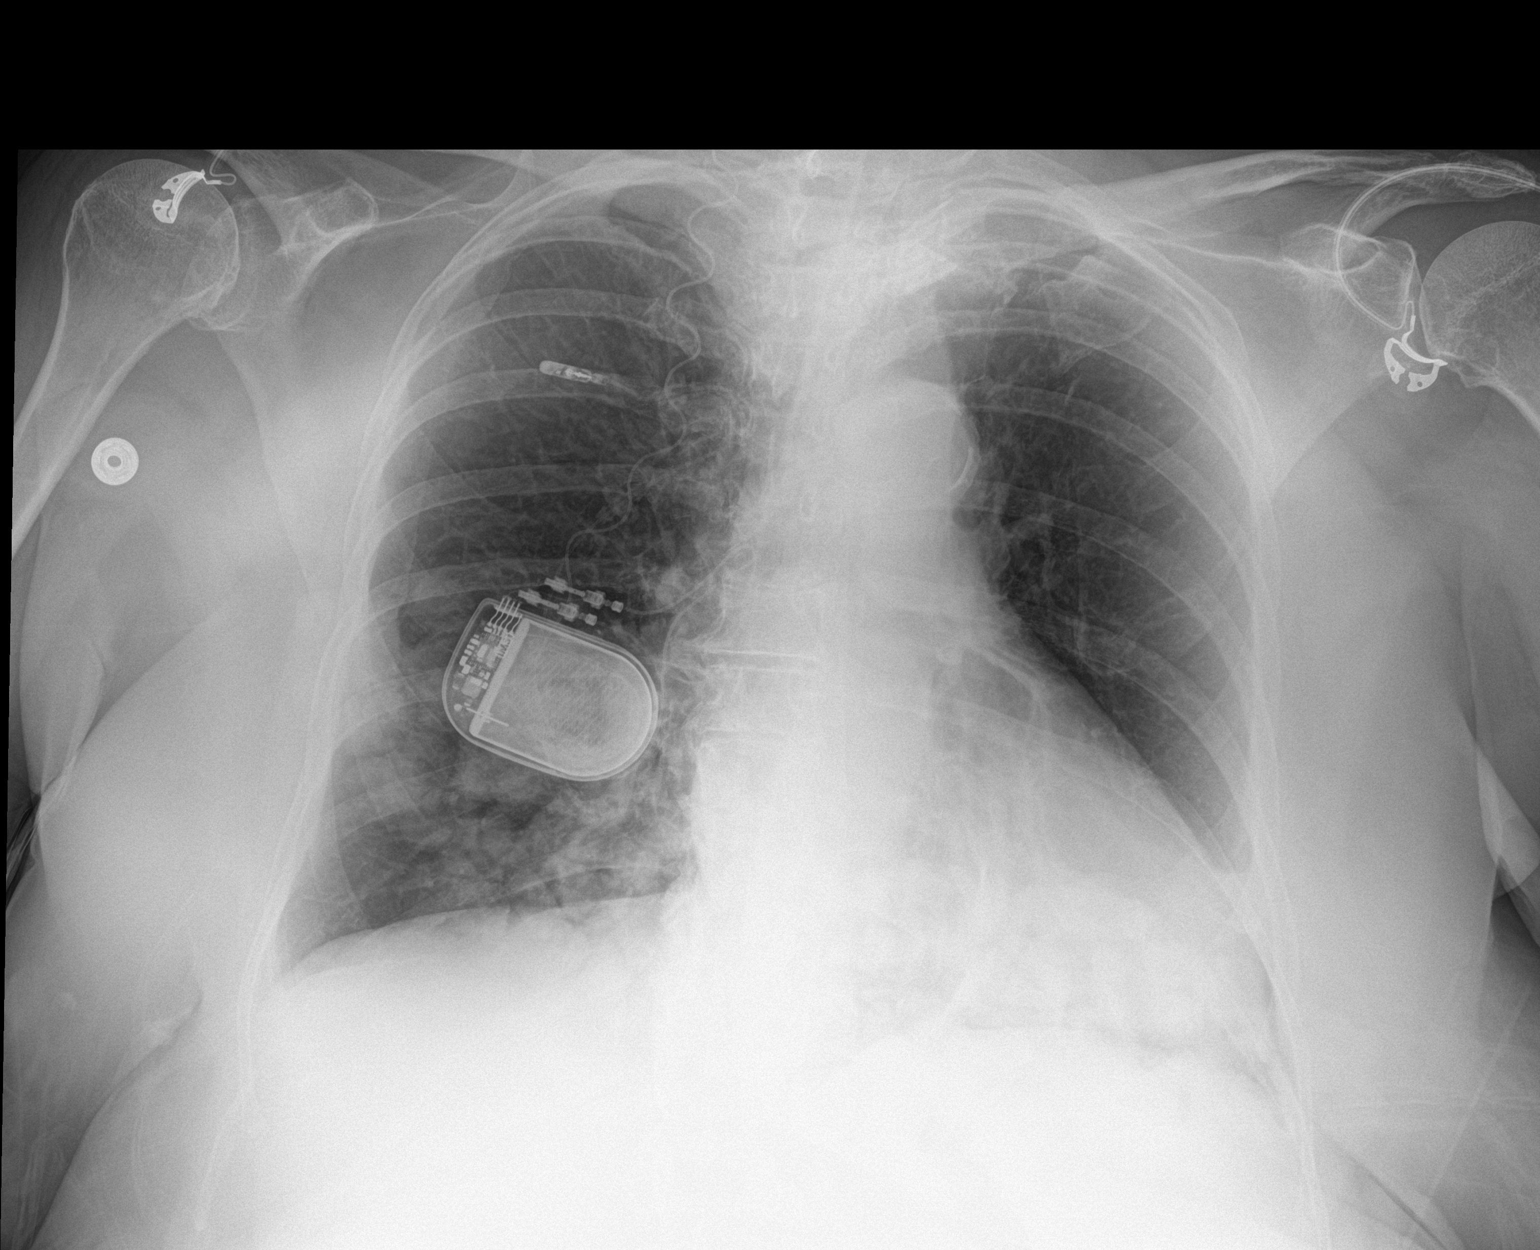

[1 of 1 positions shown; findings below may reference images not displayed]

FINDINGS: A neural stimulator has been placed with generator overlying the mid
to lower right chest, one lead terminating over the upper right
chest, and the second lead coursing into the right neck.
Heterogeneous density projecting over the right lower hemithorax
inferior to the generator is favored to reflect postoperative
changes in the chest wall soft tissues and/or material external to
the patient, although a component of right basilar lung airspace
opacity is also possible. There is mild airspace opacity in the left
lung base. No sizable pleural effusion or pneumothorax is
identified. The cardiomediastinal silhouette is within normal
limits. Aortic atherosclerosis is noted. No acute osseous
abnormality is seen.
IMPRESSION: 1. Hypoglossal nerve stimulator placement as above.
2. Mild opacity in the left and possibly right lung bases, likely
atelectasis.

## 2022-10-15 IMAGING — CR DG NECK SOFT TISSUE
1 series · 1 of 1 positions shown · non-contrast
Comparison: None.

CLINICAL DATA: Obstructive sleep apnea

EXAM:
NECK SOFT TISSUES - 1+ VIEW

[neck lat]
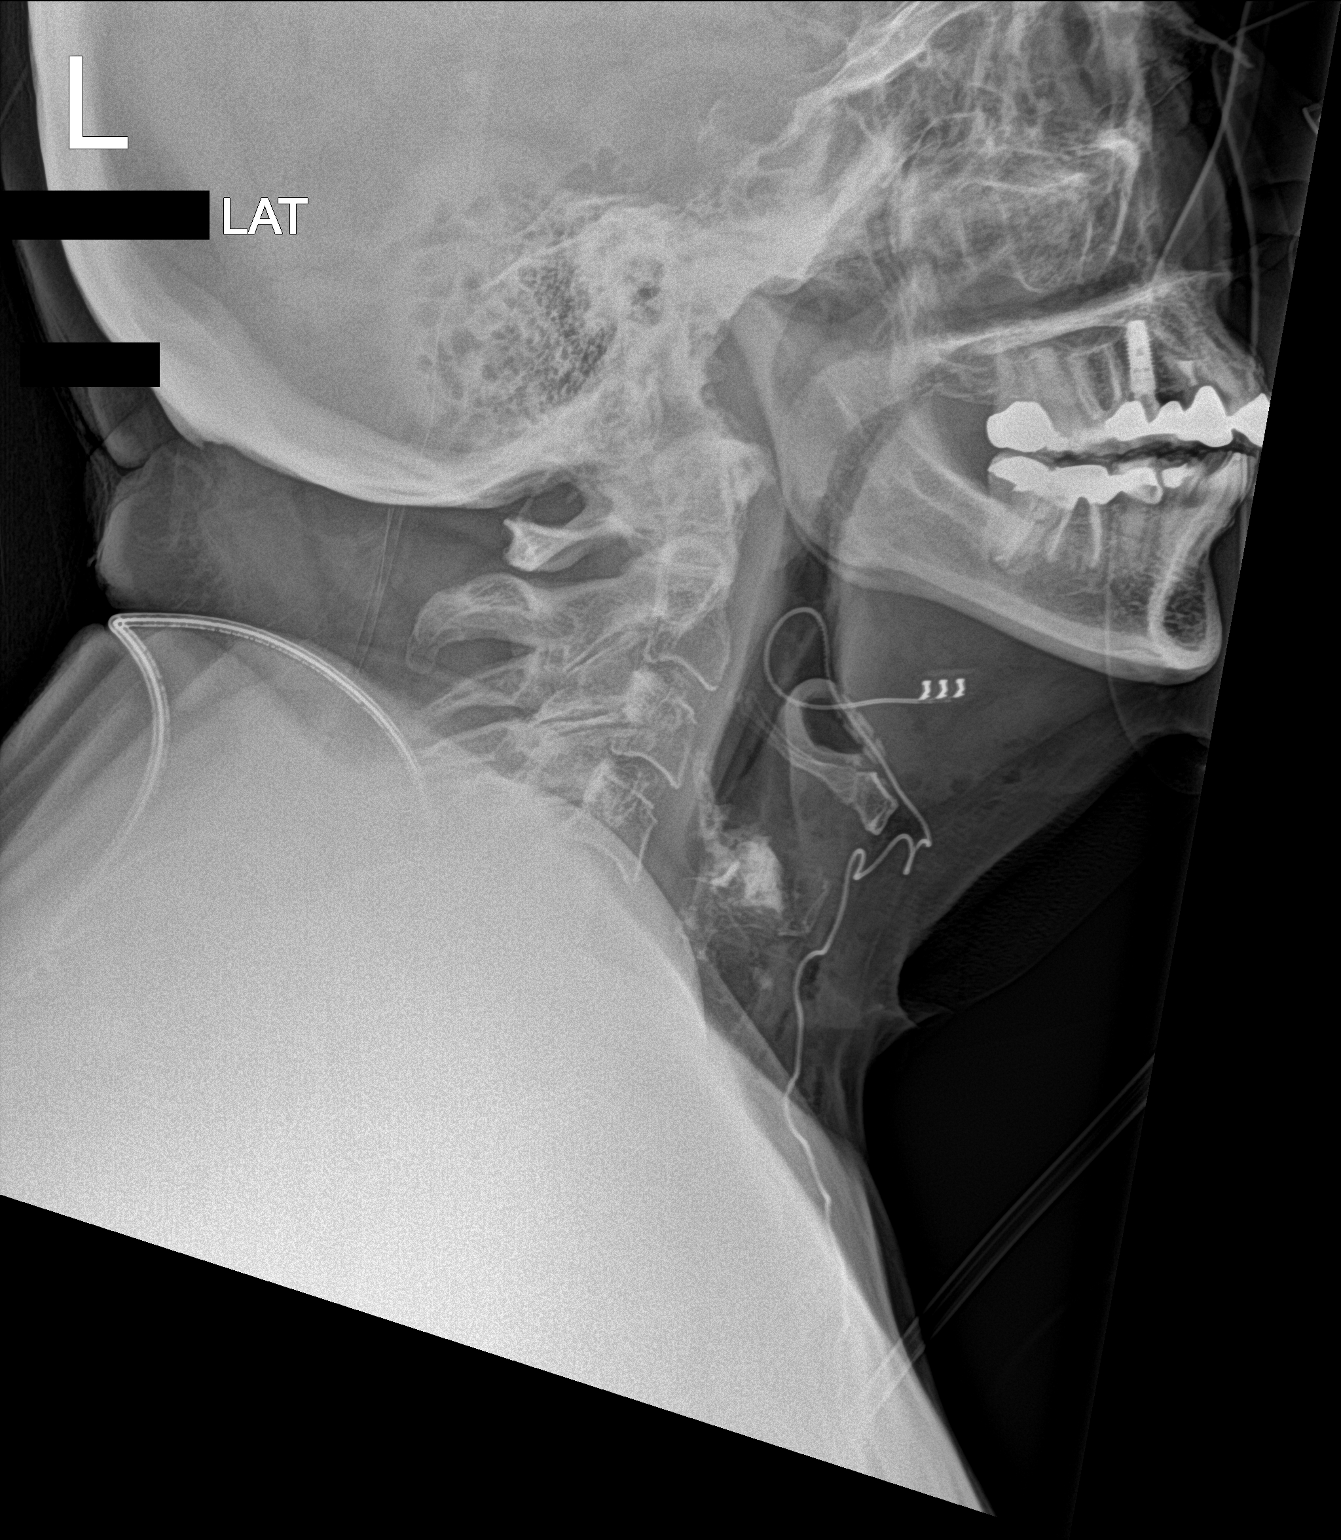

[1 of 1 positions shown; findings below may reference images not displayed]

FINDINGS: Nasopharynx and oropharynx appear unremarkable. No evidence of
adenoidal enlargement. No prevertebral/retropharyngeal enlargement.
Hypoglossal stimulator in place. Small amount of air in the soft
tissues of the neck as expected.
IMPRESSION: No complication seen following hypoglossal stimulator placement.

## 2022-10-23 DIAGNOSIS — E785 Hyperlipidemia, unspecified: Secondary | ICD-10-CM | POA: Diagnosis not present

## 2022-10-23 DIAGNOSIS — M8580 Other specified disorders of bone density and structure, unspecified site: Secondary | ICD-10-CM | POA: Diagnosis not present

## 2022-10-23 DIAGNOSIS — I10 Essential (primary) hypertension: Secondary | ICD-10-CM | POA: Diagnosis not present

## 2022-10-23 DIAGNOSIS — R7989 Other specified abnormal findings of blood chemistry: Secondary | ICD-10-CM | POA: Diagnosis not present

## 2022-10-29 DIAGNOSIS — E119 Type 2 diabetes mellitus without complications: Secondary | ICD-10-CM | POA: Diagnosis not present

## 2022-10-29 DIAGNOSIS — E7800 Pure hypercholesterolemia, unspecified: Secondary | ICD-10-CM | POA: Diagnosis not present

## 2022-10-29 DIAGNOSIS — M353 Polymyalgia rheumatica: Secondary | ICD-10-CM | POA: Diagnosis not present

## 2022-10-29 DIAGNOSIS — I10 Essential (primary) hypertension: Secondary | ICD-10-CM | POA: Diagnosis not present

## 2022-10-30 DIAGNOSIS — M47816 Spondylosis without myelopathy or radiculopathy, lumbar region: Secondary | ICD-10-CM | POA: Diagnosis not present

## 2022-10-30 DIAGNOSIS — G4733 Obstructive sleep apnea (adult) (pediatric): Secondary | ICD-10-CM | POA: Diagnosis not present

## 2022-10-30 DIAGNOSIS — M353 Polymyalgia rheumatica: Secondary | ICD-10-CM | POA: Diagnosis not present

## 2022-10-30 DIAGNOSIS — K8020 Calculus of gallbladder without cholecystitis without obstruction: Secondary | ICD-10-CM | POA: Diagnosis not present

## 2022-10-30 DIAGNOSIS — K635 Polyp of colon: Secondary | ICD-10-CM | POA: Diagnosis not present

## 2022-10-30 DIAGNOSIS — I10 Essential (primary) hypertension: Secondary | ICD-10-CM | POA: Diagnosis not present

## 2022-10-30 DIAGNOSIS — E119 Type 2 diabetes mellitus without complications: Secondary | ICD-10-CM | POA: Diagnosis not present

## 2022-10-30 DIAGNOSIS — G720 Drug-induced myopathy: Secondary | ICD-10-CM | POA: Diagnosis not present

## 2022-10-30 DIAGNOSIS — E785 Hyperlipidemia, unspecified: Secondary | ICD-10-CM | POA: Diagnosis not present

## 2022-10-30 DIAGNOSIS — I480 Paroxysmal atrial fibrillation: Secondary | ICD-10-CM | POA: Diagnosis not present

## 2022-10-30 DIAGNOSIS — R109 Unspecified abdominal pain: Secondary | ICD-10-CM | POA: Diagnosis not present

## 2022-10-30 DIAGNOSIS — M8580 Other specified disorders of bone density and structure, unspecified site: Secondary | ICD-10-CM | POA: Diagnosis not present

## 2022-11-01 NOTE — Progress Notes (Deleted)
[name] [name] Date of Birth: Aug 13, 1946   History of Present Illness: [name] [name] is seen for  followup. She has a history of atrial fibrillation that has been well controlled with sotalol since 2003. Prior use of Toprol caused her to be very fatigued and calcium channel blockers did not help.   She previously developed a bad pruritic rash all over. Skin biopsy was c/w a drug reaction. She was placed on steroids and Xarelto was stopped in favor of Eliquis. Rash did not clear on Eliquis and this was also stopped. Rash finally resolved. Patient given option of Pradaxa but she refused. Now only taking ASA 81 mg daily. She denies any Afib episodes. Steroids now 5 mg daily for PMR. She has Cardiomobile which she carries with her now. She has a history of OSA and is  on CPAP.   She was seen on April 21, 2019 for complaints of palpitations.  Noted more PVCs on her Apple phone but no AFib. A Zio monitor was placed which showed no Afib. PVCs with one brief run of NSVT and several brief runs of SVT. She started taking a magnesium supplement and this resolved her palpitations.  She reports she was diagnosed with type 2 DM. Started on Ozempic. She has lost some weight. Reports no episodes of AFib and PVCs are much less. Not as active now.   Recently seen by Dr Georgette Dover with cholecystitis. She underwent elective laparoscopic cholecystectomy on Oct 08, 2022.   Current Outpatient Medications on File Prior to Visit  Medication Sig Dispense Refill   albuterol (PROAIR HFA) 108 (90 Base) MCG/ACT inhaler Inhale 2 puffs into the lungs every 6 (six) hours as needed. (Patient taking differently: Inhale 2 puffs into the lungs every 6 (six) hours as needed for wheezing or shortness of breath.) 1 Inhaler 5   Ascorbic Acid (VITAMIN C PO) Take 1 tablet by mouth daily.     aspirin EC 81 MG tablet Take 81 mg by mouth every 30 (thirty) days.     b complex vitamins capsule Take 1 capsule by mouth daily.     chlorthalidone  (HYGROTON) 25 MG tablet Take 1 tablet (25 mg total) by mouth daily. 90 tablet 1   Cholecalciferol (VITAMIN D3) 75 MCG (3000 UT) TABS Take 3,000 Units by mouth daily.     estradiol (VIVELLE-DOT) 0.05 MG/24HR patch Place 1 patch onto the skin 2 (two) times a week.     ezetimibe (ZETIA) 10 MG tablet TAKE 1 TABLET BY MOUTH EVERY DAY 90 tablet 3   fluticasone (FLONASE) 50 MCG/ACT nasal spray Place 2 sprays into both nostrils daily as needed for allergies.     irbesartan (AVAPRO) 300 MG tablet Take 300 mg by mouth daily.     Magnesium Oxide (MAG-OXIDE PO) Take 1 tablet by mouth at bedtime.     montelukast (SINGULAIR) 10 MG tablet Take 10 mg by mouth daily.     Multiple Vitamin (MULTI-VITAMINS) TABS Take 1 tablet by mouth daily.      Multiple Vitamins-Minerals (LUTEIN-ZEAXANTHIN PO) Take 1 tablet by mouth daily.     Omega-3 Fatty Acids (OMEGA 3 PO) Take 1 capsule by mouth daily.     omeprazole (PRILOSEC) 20 MG capsule Take 20 mg by mouth daily as needed.     OVER THE COUNTER MEDICATION Take 2 tablets by mouth at bedtime. Restful monk sleep supplement (without melatonin)     PRALUENT 75 MG/ML SOAJ Inject 1 mL into the skin every 14 (fourteen) days.  predniSONE (DELTASONE) 5 MG tablet Take 5 mg by mouth daily with breakfast.     sotalol (BETAPACE) 80 MG tablet Take 80 mg by mouth 2 (two) times daily.     venlafaxine XR (EFFEXOR-XR) 150 MG 24 hr capsule Take 150 mg by mouth daily.     No current facility-administered medications on file prior to visit.    Allergies  Allergen Reactions   Atorvastatin Other (See Comments)    Muscle cramps severe      Simvastatin Other (See Comments)    Severe muscle cramps   Cephalosporins     'ran a fever"   Nickel Rash   Sulfa Antibiotics Hives   Sulfasalazine Hives   Eliquis [Apixaban] Hives and Itching   Xarelto [Rivaroxaban] Hives and Itching    Past Medical History:  Diagnosis Date   Anemia    years ago after surgery   Asthma 1976   Atrial  fibrillation ([place])    Bronchitis    Dysrhythmia    A-fib   FUO (fever of unknown origin) 03/03/2015   GERD (gastroesophageal reflux disease)    Hepatitis 1980s   Hepatitis    non A- non B   HTN (hypertension)    Hypercholesterolemia    Night sweat 03/03/2015   PMR (polymyalgia rheumatica) ([place]) 03/15/2015   Polyarthritis 03/03/2015   Polymyalgia ([place]) 03/03/2015   Polymyositis ([place]) 03/03/2015   Sleep apnea     Past Surgical History:  Procedure Laterality Date   ABDOMINAL HYSTERECTOMY     APPENDECTOMY  09/24/1971   CHOLECYSTECTOMY N/A 10/08/2022   Procedure: LAPAROSCOPIC CHOLECYSTECTOMY;  Surgeon: Donnie Mesa, MD;  Location: [facility];  Service: General;  Laterality: N/A;   DG THUMB RIGHT HAND ([place] HX)     DRUG INDUCED ENDOSCOPY N/A 11/14/2021   Procedure: DRUG INDUCED SLEEP ENDOSCOPY;  Surgeon: [name], MD;  Location: [facility];  Service: ENT;  Laterality: N/A;   FOOT SURGERY Left 09/24/2007   IMPLANTATION OF HYPOGLOSSAL NERVE STIMULATOR Right 12/18/2021   Procedure: IMPLANTATION OF HYPOGLOSSAL NERVE STIMULATOR;  Surgeon: [name], MD;  Location: [facility];  Service: ENT;  Laterality: Right;   INTRAOPERATIVE CHOLANGIOGRAM N/A 10/08/2022   Procedure: INTRAOPERATIVE CHOLANGIOGRAM;  Surgeon: Donnie Mesa, MD;  Location: [facility];  Service: General;  Laterality: N/A;   KNEE ARTHROPLASTY  09/23/2005   right   OVARY SURGERY  09/24/1999   TUBAL LIGATION  09/23/1970   VAGINAL HYSTERECTOMY  09/24/1971    Social History   Tobacco Use  Smoking Status Former   Packs/day: 0.20   Years: 5.00   Total pack years: 1.00   Types: Cigarettes   Quit date: 09/23/1980   Years since quitting: 42.1  Smokeless Tobacco Never    Social History   Substance and Sexual Activity  Alcohol Use Yes   Comment: 2 glasses of wine a day    Family History  Problem Relation Age of Onset   Hypertension Sister    Lung cancer Father    Hypertension Mother     Multiple sclerosis Daughter    Breast cancer Other        maternal aunt   Allergies Daughter    Allergies Daughter    Allergies Son     Review of Systems: As noted in history of present illness.  All other systems were reviewed and are negative.  Physical Exam: There were no vitals taken for this visit. GENERAL:  Well appearing, obese WF in NAD HEENT:  PERRL,  EOMI, sclera are clear. Oropharynx is clear. NECK:  No jugular venous distention, carotid upstroke brisk and symmetric, no bruits, no thyromegaly or adenopathy LUNGS:  Clear to auscultation bilaterally CHEST:  Unremarkable HEART:  RRR,  PMI not displaced or sustained,S1 and S2 within normal limits, no S3, no S4: no clicks, no rubs, no murmurs ABD:  Soft, nontender. BS +, no masses or bruits. No hepatomegaly, no splenomegaly EXT:  2 + pulses throughout, no edema, no cyanosis no clubbing SKIN:  Warm and dry.  No rashes NEURO:  Alert and oriented x 3. Cranial nerves II through XII intact. PSYCH:  Cognitively intact    LBORATORY DATA:  Lab Results  Component Value Date   WBC 8.1 10/08/2022   HGB 13.9 10/08/2022   HCT 39.5 10/08/2022   PLT 197 10/08/2022   GLUCOSE 101 (H) 10/08/2022   ALT 38 (H) 04/21/2019   AST 37 04/21/2019   NA 137 10/08/2022   K 3.4 (L) 10/08/2022   CL 100 10/08/2022   CREATININE 0.86 10/08/2022   BUN 20 10/08/2022   CO2 24 10/08/2022   Labs dated 02/06/16: cholesterol 236, triglycerides 132, HDL 69, LDL 141. CMET and TSH normal.  November 27/2017: A1c 5.6%. Dated 02/12/17: cholesterol 251, triglycerides 226, HDL 59, LDL 147.  Dated 07/23/17: A1c 5.5% Dated 09/25/17: normal chemistries and TSH. Dated 03/29/19: cholesterol 275, triglycerides 333, HDL 63, LDL 145. LFTs normal.  Dated 11/18/19: Normal BMET. Dated 01/03/20: A1c 5.5% Dated 04/21/20: cholesterol 221, triglycerides 317, HDL 62, LDL 96. Potassium 3.6. CMET, CBC, TSH normal Dated 09/06/20: A1c 5.2%. Dated 06/25/22: cholesterol 208,  triglycerides 207, HDL 48, LDL 119. CMET and TSH normal Dated 10/30/22: A1c 5.5%   Event monitor: Study Highlights 05/12/19   Normal sinus rhythm Rare PVCs, trigeminy One 6 beat run of NSVT Rare PACs. 6 runs of SVT. longest 18 seconds at rate max 152. symptomatic.     Assessment / Plan: 1. Atrial fibrillation, well controlled on sotalol. No episodes in several years. She has a [place] Vasc score of 3. Intolerant of Xarelto and possibly Eliquis due to drug rash. Will continue to monitor with Cardiomobile. Stay on ASA 81 mg daily.   2. Hypertension. Blood pressure is well controlled.   3. PMR on steroids.   4. OSA on CPAP- followed by [name]  5. PVCs and short runs of SVT. Intolerant of beta blockers and calcium channel blockers in the past.  Symptoms better on magnesium. Continue Sotalol.  Follow up in one year

## 2022-11-06 DIAGNOSIS — R69 Illness, unspecified: Secondary | ICD-10-CM | POA: Diagnosis not present

## 2022-11-07 ENCOUNTER — Ambulatory Visit: Payer: Medicare HMO | Admitting: Cardiology

## 2022-11-18 ENCOUNTER — Ambulatory Visit: Payer: Medicare HMO | Attending: Cardiology | Admitting: Nurse Practitioner

## 2022-11-18 ENCOUNTER — Encounter: Payer: Self-pay | Admitting: Nurse Practitioner

## 2022-11-18 VITALS — BP 122/64 | HR 64 | Ht 64.0 in | Wt 184.2 lb

## 2022-11-18 DIAGNOSIS — I480 Paroxysmal atrial fibrillation: Secondary | ICD-10-CM

## 2022-11-18 DIAGNOSIS — E782 Mixed hyperlipidemia: Secondary | ICD-10-CM

## 2022-11-18 DIAGNOSIS — I493 Ventricular premature depolarization: Secondary | ICD-10-CM | POA: Diagnosis not present

## 2022-11-18 DIAGNOSIS — I10 Essential (primary) hypertension: Secondary | ICD-10-CM | POA: Diagnosis not present

## 2022-11-18 DIAGNOSIS — I471 Supraventricular tachycardia, unspecified: Secondary | ICD-10-CM | POA: Diagnosis not present

## 2022-11-18 DIAGNOSIS — G4733 Obstructive sleep apnea (adult) (pediatric): Secondary | ICD-10-CM

## 2022-11-18 DIAGNOSIS — M353 Polymyalgia rheumatica: Secondary | ICD-10-CM | POA: Diagnosis not present

## 2022-11-18 NOTE — Progress Notes (Signed)
Office Visit    Patient Name: [name] [name] Date of Encounter: 11/18/2022  Primary Care Provider:  Prince Solian, MD Primary Cardiologist:  Peter Martinique, MD  Chief Complaint    77 year old female with a history of paroxysmal atrial fibrillation not on anticoagulation, PVCs, PSVT, hypertension,  hyperlipidemia, OSA s/p Inspire, polymyalgia rheumatica, and GERD who presents for follow-up related to atrial fibrillation.  Past Medical History    Past Medical History:  Diagnosis Date   Anemia    years ago after surgery   Asthma 1976   Atrial fibrillation ([place])    Bronchitis    Dysrhythmia    A-fib   FUO (fever of unknown origin) 03/03/2015   GERD (gastroesophageal reflux disease)    Hepatitis 1980s   Hepatitis    non A- non B   HTN (hypertension)    Hypercholesterolemia    Night sweat 03/03/2015   PMR (polymyalgia rheumatica) ([place]) 03/15/2015   Polyarthritis 03/03/2015   Polymyalgia ([place]) 03/03/2015   Polymyositis ([place]) 03/03/2015   Sleep apnea    Past Surgical History:  Procedure Laterality Date   ABDOMINAL HYSTERECTOMY     APPENDECTOMY  09/24/1971   CHOLECYSTECTOMY N/A 10/08/2022   Procedure: LAPAROSCOPIC CHOLECYSTECTOMY;  Surgeon: Donnie Mesa, MD;  Location: [facility];  Service: General;  Laterality: N/A;   DG THUMB RIGHT HAND ([place] HX)     DRUG INDUCED ENDOSCOPY N/A 11/14/2021   Procedure: DRUG INDUCED SLEEP ENDOSCOPY;  Surgeon: [name], MD;  Location: [facility];  Service: ENT;  Laterality: N/A;   FOOT SURGERY Left 09/24/2007   IMPLANTATION OF HYPOGLOSSAL NERVE STIMULATOR Right 12/18/2021   Procedure: IMPLANTATION OF HYPOGLOSSAL NERVE STIMULATOR;  Surgeon: [name], MD;  Location: [facility];  Service: ENT;  Laterality: Right;   INTRAOPERATIVE CHOLANGIOGRAM N/A 10/08/2022   Procedure: INTRAOPERATIVE CHOLANGIOGRAM;  Surgeon: Donnie Mesa, MD;  Location: [facility];  Service: General;  Laterality: N/A;   KNEE ARTHROPLASTY   09/23/2005   right   OVARY SURGERY  09/24/1999   TUBAL LIGATION  09/23/1970   VAGINAL HYSTERECTOMY  09/24/1971    Allergies  Allergies  Allergen Reactions   Atorvastatin Other (See Comments)    Muscle cramps severe      Simvastatin Other (See Comments)    Severe muscle cramps   Cephalosporins     'ran a fever"   Nickel Rash   Sulfa Antibiotics Hives   Sulfasalazine Hives   Eliquis [Apixaban] Hives and Itching   Xarelto [Rivaroxaban] Hives and Itching     Labs/Other Studies Reviewed    The following studies were reviewed today: Cardiac monitor 04/2019: Normal sinus rhythm Rare PVCs, trigeminy One 6 beat run of NSVT Rare PACs. 6 runs of SVT. longest 18 seconds at rate max 152. symptomatic.  Recent Labs: 10/08/2022: BUN 20; Creatinine, Ser 0.86; Hemoglobin 13.9; Platelets 197; Potassium 3.4; Sodium 137  Recent Lipid Panel No results found for: "CHOL", "TRIG", "HDL", "CHOLHDL", "VLDL", "LDLCALC", "LDLDIRECT"  History of Present Illness    77 year old female with the above past medical history including paroxysmal atrial fibrillation not on anticoagulation, PVCs, PSVT, hypertension,  hyperlipidemia, OSA s/p Inspire, polymyalgia rheumatica, and GERD.   She was diagnosed with atrial fibrillation previously on Toprol, however, this caused significant fatigue.  She also noted fatigue with calcium channel blockers.  She was later transitioned to sotalol.  She has not on anticoagulation given history of adverse reaction to Eliquis and Xarelto (patient declined Pradaxa).  Monitor in 2020 in  the setting of palpitations showed PVCs, NSVT, PSVT, no evidence of atrial fibrillation. Palpitations resolved with magnesium supplementation.  She was last seen in the office on 09/05/2022 and was stable from a cardiac standpoint.  She underwent laparoscopic cholecystectomy on 10/08/2022.  She presents today for follow-up. Since her last visit she has  She has been stable from a cardiac  standpoint.  Approximately 1 week ago she had an episode where she felt sudden onset dizziness, chest tightness, she felt that she was in atrial fibrillation but was not wearing her Apple Watch to confirm this.  Her symptoms lasted for approximately 20 minutes and resolve spontaneously.  No episodes since.  Other than her recent episode of possible atrial fibrillation, she reports feeling well.  Home Medications    Current Outpatient Medications  Medication Sig Dispense Refill   albuterol (PROAIR HFA) 108 (90 Base) MCG/ACT inhaler Inhale 2 puffs into the lungs every 6 (six) hours as needed. (Patient taking differently: Inhale 2 puffs into the lungs every 6 (six) hours as needed for wheezing or shortness of breath.) 1 Inhaler 5   Ascorbic Acid (VITAMIN C PO) Take 1 tablet by mouth daily.     aspirin EC 81 MG tablet Take 81 mg by mouth every 30 (thirty) days.     b complex vitamins capsule Take 1 capsule by mouth daily.     chlorthalidone (HYGROTON) 25 MG tablet Take 1 tablet (25 mg total) by mouth daily. 90 tablet 1   Cholecalciferol (VITAMIN D3) 75 MCG (3000 UT) TABS Take 3,000 Units by mouth daily.     estradiol (VIVELLE-DOT) 0.05 MG/24HR patch Place 1 patch onto the skin 2 (two) times a week.     ezetimibe (ZETIA) 10 MG tablet TAKE 1 TABLET BY MOUTH EVERY DAY 90 tablet 3   fluticasone (FLONASE) 50 MCG/ACT nasal spray Place 2 sprays into both nostrils daily as needed for allergies.     irbesartan (AVAPRO) 300 MG tablet Take 300 mg by mouth daily.     Magnesium Oxide (MAG-OXIDE PO) Take 1 tablet by mouth at bedtime.     montelukast (SINGULAIR) 10 MG tablet Take 10 mg by mouth daily.     Multiple Vitamin (MULTI-VITAMINS) TABS Take 1 tablet by mouth daily.      Multiple Vitamins-Minerals (LUTEIN-ZEAXANTHIN PO) Take 1 tablet by mouth daily.     Omega-3 Fatty Acids (OMEGA 3 PO) Take 1 capsule by mouth daily.     omeprazole (PRILOSEC) 20 MG capsule Take 20 mg by mouth daily as needed.     OVER THE  COUNTER MEDICATION Take 2 tablets by mouth at bedtime. Restful monk sleep supplement (without melatonin)     PRALUENT 75 MG/ML SOAJ Inject 1 mL into the skin every 14 (fourteen) days.     predniSONE (DELTASONE) 5 MG tablet Take 5 mg by mouth daily with breakfast.     sotalol (BETAPACE) 80 MG tablet Take 80 mg by mouth 2 (two) times daily.     venlafaxine XR (EFFEXOR-XR) 150 MG 24 hr capsule Take 150 mg by mouth daily.     No current facility-administered medications for this visit.     Review of Systems    She denies chest pain, dyspnea, pnd, orthopnea, n, v, dizziness, syncope, edema, weight gain, or early satiety. All other systems reviewed and are otherwise negative except as noted above.   Physical Exam    VS:  BP 122/64   Pulse 64   Ht '5\' 4"'$  (1.626 m)  Wt 184 lb 3.2 oz (83.6 kg)   SpO2 97%   BMI 31.62 kg/m  GEN: Well nourished, well developed, in no acute distress. HEENT: normal. Neck: Supple, no JVD, carotid bruits, or masses. Cardiac: RRR, no murmurs, rubs, or gallops. No clubbing, cyanosis, edema.  Radials/DP/PT 2+ and equal bilaterally.  Respiratory:  Respirations regular and unlabored, clear to auscultation bilaterally. GI: Soft, nontender, nondistended, BS + x 4. MS: no deformity or atrophy. Skin: warm and dry, no rash. Neuro:  Strength and sensation are intact. Psych: Normal affect.  Accessory Clinical Findings    ECG personally reviewed by me today -NSR, 64 bpm- no acute changes.   Lab Results  Component Value Date   WBC 8.1 10/08/2022   HGB 13.9 10/08/2022   HCT 39.5 10/08/2022   MCV 95.6 10/08/2022   PLT 197 10/08/2022   Lab Results  Component Value Date   CREATININE 0.86 10/08/2022   BUN 20 10/08/2022   NA 137 10/08/2022   K 3.4 (L) 10/08/2022   CL 100 10/08/2022   CO2 24 10/08/2022   Lab Results  Component Value Date   ALT 38 (H) 04/21/2019   AST 37 04/21/2019   ALKPHOS 79 04/21/2019   BILITOT 0.8 04/21/2019   No results found for:  "CHOL", "HDL", "LDLCALC", "LDLDIRECT", "TRIG", "CHOLHDL"  No results found for: "HGBA1C"  Assessment & Plan    1. Paroxysmal atrial fibrillation: Maintaining NSR. She thinks she had a breakthrough episode of atrial fibrillation approximately 1 week ago.  She felt dizzy, and had associated chest tightness.  She was not wearing her Apple Watch to confirm arrhythmia.  Her symptoms lasted for 20 minutes and resolve spontaneously.  Not on anticoagulation due to history of adverse reaction to Eliquis and Xarelto (patient declined Pradaxa).  Could consider Coumadin.  Will refer to Pharm.D for further discussion.  Discussed ED precautions.  Continue to monitor symptoms.  Continue sotalol.   2. PVCs/PSVT: Monitor in 2020 in the setting of palpitations showed PVCs, NSVT, PSVT, no evidence of atrial fibrillation.  Stable on sotalol.   3. Hypertension: BP well controlled. Continue current antihypertensive regimen.    4. Hyperlipidemia: LDL was 119 in 06/2022.  He states she is no longer able to afford Praluent.  Will have her follow-up with lipid clinic Pharm.D. to discuss alternative therapy (she may be candidate for Leqvio).  For now, continue aspirin, Praluent, Zetia.     5. OSA: S/p inspire, no concerns.    6. PMR: On chronic steroids.    7. Disposition: Follow-up in 3-4 months.      Lenna Sciara, NP 11/18/2022, 3:23 PM

## 2022-11-18 NOTE — Patient Instructions (Signed)
Medication Instructions:  Your physician recommends that you continue on your current medications as directed. Please refer to the Current Medication list given to you today.   *If you need a refill on your cardiac medications before your next appointment, please call your pharmacy*   Lab Work: NONE ordered at this time of appointment   If you have labs (blood work) drawn today and your tests are completely normal, you will receive your results only by: [place] (if you have MyChart) OR A paper copy in the mail If you have any lab test that is abnormal or we need to change your treatment, we will call you to review the results.   Testing/Procedures: NONE ordered at this time of appointment     Follow-Up: At [facility], you and your health needs are our priority.  As part of our continuing mission to provide you with exceptional heart care, we have created designated Provider Care Teams.  These Care Teams include your primary Cardiologist (physician) and Advanced Practice Providers (APPs -  Physician Assistants and Nurse Practitioners) who all work together to provide you with the care you need, when you need it.  We recommend signing up for the patient portal called "MyChart".  Sign up information is provided on this After Visit Summary.  MyChart is used to connect with patients for Virtual Visits (Telemedicine).  Patients are able to view lab/test results, encounter notes, upcoming appointments, etc.  Non-urgent messages can be sent to your provider as well.   To learn more about what you can do with MyChart, go to [email or web address].    Your next appointment:   3-4 month(s)  Provider:   Diona Browner, NP        Other Instructions Referral sent to Pharm-D

## 2022-12-18 DIAGNOSIS — N958 Other specified menopausal and perimenopausal disorders: Secondary | ICD-10-CM | POA: Diagnosis not present

## 2022-12-18 DIAGNOSIS — Z01419 Encounter for gynecological examination (general) (routine) without abnormal findings: Secondary | ICD-10-CM | POA: Diagnosis not present

## 2022-12-18 DIAGNOSIS — Z6833 Body mass index (BMI) 33.0-33.9, adult: Secondary | ICD-10-CM | POA: Diagnosis not present

## 2022-12-18 NOTE — Progress Notes (Deleted)
Patient ID: [name] [name]                 DOB: Aug 20, 1946                    MRN: [id number]      HPI: [name] [name] is a 77 y.o. female patient referred to lipid clinic by ***. PMH is significant for    Reviewed options for lowering LDL cholesterol, including ezetimibe, PCSK-9 inhibitors, bempedoic acid and inclisiran.  Discussed mechanisms of action, dosing, side effects and potential decreases in LDL cholesterol.  Also reviewed cost information and potential options for patient assistance.  Current Medications:  Intolerances:  Risk Factors:  LDL goal:   Diet:   Exercise:   Family History:   Social History:   Labs: Lipid Panel  No results found for: "CHOL", "TRIG", "HDL", "CHOLHDL", "VLDL", "LDLCALC", "LDLDIRECT", "LABVLDL"  Past Medical History:  Diagnosis Date   Anemia    years ago after surgery   Asthma 1976   Atrial fibrillation ([place])    Bronchitis    Dysrhythmia    A-fib   FUO (fever of unknown origin) 03/03/2015   GERD (gastroesophageal reflux disease)    Hepatitis 1980s   Hepatitis    non A- non B   HTN (hypertension)    Hypercholesterolemia    Night sweat 03/03/2015   PMR (polymyalgia rheumatica) ([place]) 03/15/2015   Polyarthritis 03/03/2015   Polymyalgia ([place]) 03/03/2015   Polymyositis ([place]) 03/03/2015   Sleep apnea     Current Outpatient Medications on File Prior to Visit  Medication Sig Dispense Refill   albuterol (PROAIR HFA) 108 (90 Base) MCG/ACT inhaler Inhale 2 puffs into the lungs every 6 (six) hours as needed. (Patient taking differently: Inhale 2 puffs into the lungs every 6 (six) hours as needed for wheezing or shortness of breath.) 1 Inhaler 5   Ascorbic Acid (VITAMIN C PO) Take 1 tablet by mouth daily.     aspirin EC 81 MG tablet Take 81 mg by mouth every 30 (thirty) days.     b complex vitamins capsule Take 1 capsule by mouth daily.     chlorthalidone (HYGROTON) 25 MG tablet Take 1 tablet (25 mg total) by mouth daily. 90 tablet 1    Cholecalciferol (VITAMIN D3) 75 MCG (3000 UT) TABS Take 3,000 Units by mouth daily.     estradiol (VIVELLE-DOT) 0.05 MG/24HR patch Place 1 patch onto the skin 2 (two) times a week.     ezetimibe (ZETIA) 10 MG tablet TAKE 1 TABLET BY MOUTH EVERY DAY 90 tablet 3   fluticasone (FLONASE) 50 MCG/ACT nasal spray Place 2 sprays into both nostrils daily as needed for allergies.     irbesartan (AVAPRO) 300 MG tablet Take 300 mg by mouth daily.     Magnesium Oxide (MAG-OXIDE PO) Take 1 tablet by mouth at bedtime.     montelukast (SINGULAIR) 10 MG tablet Take 10 mg by mouth daily.     Multiple Vitamin (MULTI-VITAMINS) TABS Take 1 tablet by mouth daily.      Multiple Vitamins-Minerals (LUTEIN-ZEAXANTHIN PO) Take 1 tablet by mouth daily.     Omega-3 Fatty Acids (OMEGA 3 PO) Take 1 capsule by mouth daily.     omeprazole (PRILOSEC) 20 MG capsule Take 20 mg by mouth daily as needed.     OVER THE COUNTER MEDICATION Take 2 tablets by mouth at bedtime. Restful monk sleep supplement (without melatonin)     PRALUENT 75 MG/ML SOAJ  Inject 1 mL into the skin every 14 (fourteen) days.     predniSONE (DELTASONE) 5 MG tablet Take 5 mg by mouth daily with breakfast.     sotalol (BETAPACE) 80 MG tablet Take 80 mg by mouth 2 (two) times daily.     venlafaxine XR (EFFEXOR-XR) 150 MG 24 hr capsule Take 150 mg by mouth daily.     No current facility-administered medications on file prior to visit.    Allergies  Allergen Reactions   Atorvastatin Other (See Comments)    Muscle cramps severe      Simvastatin Other (See Comments)    Severe muscle cramps   Cephalosporins     'ran a fever"   Nickel Rash   Sulfa Antibiotics Hives   Sulfasalazine Hives   Eliquis [Apixaban] Hives and Itching   Xarelto [Rivaroxaban] Hives and Itching    Assessment/Plan:  1. Hyperlipidemia -  No problems updated. No problem-specific Assessment & Plan notes found for this encounter.    Thank you,  Cammy Copa, Pharm.D [facility] [address], [place], [place] [address]  Phone: [phone]; Fax: [phone]

## 2022-12-19 ENCOUNTER — Telehealth: Payer: Self-pay | Admitting: Pharmacist

## 2022-12-19 ENCOUNTER — Ambulatory Visit: Payer: Medicare HMO

## 2022-12-19 NOTE — Telephone Encounter (Signed)
Patient reports her PCP has changed her Praluent to [place] as insurance preferred product this year has been changed. Per patient, PCP had applied for PA and it has been approved but co-pay is cost prohibitive so, we enrolled patient in [place]. Co- pay card information has been shared via MyChart and patient is informed

## 2022-12-23 DIAGNOSIS — R69 Illness, unspecified: Secondary | ICD-10-CM | POA: Diagnosis not present

## 2022-12-30 DIAGNOSIS — M19011 Primary osteoarthritis, right shoulder: Secondary | ICD-10-CM | POA: Diagnosis not present

## 2023-01-04 ENCOUNTER — Emergency Department (HOSPITAL_BASED_OUTPATIENT_CLINIC_OR_DEPARTMENT_OTHER)
Admission: EM | Admit: 2023-01-04 | Discharge: 2023-01-05 | Disposition: A | Discharge status: Home / Self Care | Payer: Medicare HMO | Attending: Emergency Medicine | Admitting: Emergency Medicine

## 2023-01-04 ENCOUNTER — Emergency Department (HOSPITAL_BASED_OUTPATIENT_CLINIC_OR_DEPARTMENT_OTHER): Payer: Medicare HMO

## 2023-01-04 ENCOUNTER — Encounter (HOSPITAL_BASED_OUTPATIENT_CLINIC_OR_DEPARTMENT_OTHER): Payer: Self-pay

## 2023-01-04 ENCOUNTER — Other Ambulatory Visit: Payer: Self-pay

## 2023-01-04 DIAGNOSIS — I6381 Other cerebral infarction due to occlusion or stenosis of small artery: Secondary | ICD-10-CM | POA: Diagnosis not present

## 2023-01-04 DIAGNOSIS — Z7982 Long term (current) use of aspirin: Secondary | ICD-10-CM | POA: Diagnosis not present

## 2023-01-04 DIAGNOSIS — I10 Essential (primary) hypertension: Secondary | ICD-10-CM | POA: Insufficient documentation

## 2023-01-04 DIAGNOSIS — Z23 Encounter for immunization: Secondary | ICD-10-CM | POA: Insufficient documentation

## 2023-01-04 DIAGNOSIS — J45909 Unspecified asthma, uncomplicated: Secondary | ICD-10-CM | POA: Insufficient documentation

## 2023-01-04 DIAGNOSIS — W1830XA Fall on same level, unspecified, initial encounter: Secondary | ICD-10-CM | POA: Insufficient documentation

## 2023-01-04 DIAGNOSIS — S71111A Laceration without foreign body, right thigh, initial encounter: Secondary | ICD-10-CM | POA: Diagnosis not present

## 2023-01-04 DIAGNOSIS — Z7951 Long term (current) use of inhaled steroids: Secondary | ICD-10-CM | POA: Insufficient documentation

## 2023-01-04 DIAGNOSIS — S81811A Laceration without foreign body, right lower leg, initial encounter: Secondary | ICD-10-CM | POA: Diagnosis present

## 2023-01-04 DIAGNOSIS — S81819A Laceration without foreign body, unspecified lower leg, initial encounter: Secondary | ICD-10-CM

## 2023-01-04 DIAGNOSIS — F10120 Alcohol abuse with intoxication, uncomplicated: Secondary | ICD-10-CM | POA: Insufficient documentation

## 2023-01-04 DIAGNOSIS — Z79899 Other long term (current) drug therapy: Secondary | ICD-10-CM | POA: Diagnosis not present

## 2023-01-04 DIAGNOSIS — F10920 Alcohol use, unspecified with intoxication, uncomplicated: Secondary | ICD-10-CM

## 2023-01-04 DIAGNOSIS — G9389 Other specified disorders of brain: Secondary | ICD-10-CM | POA: Diagnosis not present

## 2023-01-04 DIAGNOSIS — C7931 Secondary malignant neoplasm of brain: Secondary | ICD-10-CM | POA: Diagnosis not present

## 2023-01-04 DIAGNOSIS — G936 Cerebral edema: Secondary | ICD-10-CM | POA: Diagnosis not present

## 2023-01-04 DIAGNOSIS — W19XXXA Unspecified fall, initial encounter: Secondary | ICD-10-CM

## 2023-01-04 DIAGNOSIS — R69 Illness, unspecified: Secondary | ICD-10-CM | POA: Diagnosis not present

## 2023-01-04 DIAGNOSIS — Z043 Encounter for examination and observation following other accident: Secondary | ICD-10-CM | POA: Diagnosis not present

## 2023-01-04 MED ORDER — TETANUS-DIPHTH-ACELL PERTUSSIS 5-2.5-18.5 LF-MCG/0.5 IM SUSY
0.5000 mL | PREFILLED_SYRINGE | Freq: Once | INTRAMUSCULAR | Status: AC
  Administered 2023-01-04: 0.5 mL via INTRAMUSCULAR
  Filled 2023-01-04: qty 0.5

## 2023-01-04 MED ORDER — LIDOCAINE-EPINEPHRINE (PF) 2 %-1:200000 IJ SOLN
20.0000 mL | Freq: Once | INTRAMUSCULAR | Status: AC
  Administered 2023-01-04: 20 mL

## 2023-01-04 MED ORDER — LIDOCAINE-EPINEPHRINE (PF) 2 %-1:200000 IJ SOLN
INTRAMUSCULAR | Status: AC
  Filled 2023-01-04: qty 20

## 2023-01-04 MED ORDER — ONDANSETRON 4 MG PO TBDP
4.0000 mg | ORAL_TABLET | Freq: Once | ORAL | Status: AC
  Administered 2023-01-04: 4 mg via ORAL
  Filled 2023-01-04: qty 1

## 2023-01-04 MED ORDER — LIDOCAINE-EPINEPHRINE 2 %-1:100000 IJ SOLN: 30.0000 mL | Freq: Once | INTRAMUSCULAR | Status: DC

## 2023-01-04 NOTE — ED Provider Notes (Signed)
[place] [facility]ERGENCY DEPARTMENT AT [facility] Provider Note   CSN: [id number] Arrival date & time: 01/04/23  2154     History {Add pertinent medical, surgical, social history, OB history to HPI:1} Chief Complaint  Patient presents with  . Extremity Laceration    SIMAYA LUMADUE is a 77 y.o. female with *** presents with extremity lac, fall, EtOH.  Patient drank a bottle of wine tonight and experienced a fall witnessed by her husband. They do not think she hit her head. She fell and rolled over a lamp, with a deep/large laceration to her R lateral thigh. Patient is intoxicated on arrival and has two episodes of emesis. She denies headache, visual changes, neuro deficits, endorses pain only in the laceration on the thigh.    HPI     Home Medications Prior to Admission medications   Medication Sig Start Date End Date Taking? Authorizing Provider  albuterol (PROAIR HFA) 108 (90 Base) MCG/ACT inhaler Inhale 2 puffs into the lungs every 6 (six) hours as needed. Patient taking differently: Inhale 2 puffs into the lungs every 6 (six) hours as needed for wheezing or shortness of breath. 09/14/18   Oretha Milch, MD  Ascorbic Acid (VITAMIN C PO) Take 1 tablet by mouth daily.    [provider]  aspirin EC 81 MG tablet Take 81 mg by mouth every 30 (thirty) days.    [provider]  b complex vitamins capsule Take 1 capsule by mouth daily.    [provider]  chlorthalidone (HYGROTON) 25 MG tablet Take 1 tablet (25 mg total) by mouth daily. 03/06/16   [place], Peter M, MD  Cholecalciferol (VITAMIN D3) 75 MCG (3000 UT) TABS Take 3,000 Units by mouth daily.    [provider]  estradiol (VIVELLE-DOT) 0.05 MG/24HR patch Place 1 patch onto the skin 2 (two) times a week. 08/29/19   [provider]  ezetimibe (ZETIA) 10 MG tablet TAKE 1 TABLET BY MOUTH EVERY DAY 07/24/22   [place], Peter M, MD  fluticasone [facility]) 50 MCG/ACT nasal spray Place 2  sprays into both nostrils daily as needed for allergies. 08/28/19   [provider]  irbesartan (AVAPRO) 300 MG tablet Take 300 mg by mouth daily.    [provider]  Magnesium Oxide (MAG-OXIDE PO) Take 1 tablet by mouth at bedtime.    [provider]  montelukast (SINGULAIR) 10 MG tablet Take 10 mg by mouth daily. 10/17/19   [provider]  Multiple Vitamin (MULTI-VITAMINS) TABS Take 1 tablet by mouth daily.     [provider]  Multiple Vitamins-Minerals (LUTEIN-ZEAXANTHIN PO) Take 1 tablet by mouth daily.    [provider]  Omega-3 Fatty Acids (OMEGA 3 PO) Take 1 capsule by mouth daily.    [provider]  omeprazole (PRILOSEC) 20 MG capsule Take 20 mg by mouth daily as needed.    [provider]  OVER THE COUNTER MEDICATION Take 2 tablets by mouth at bedtime. Restful monk sleep supplement (without melatonin)    [provider]  PRALUENT 75 MG/ML SOAJ Inject 1 mL into the skin every 14 (fourteen) days.    [provider]  predniSONE (DELTASONE) 5 MG tablet Take 5 mg by mouth daily with breakfast.    [provider]  sotalol (BETAPACE) 80 MG tablet Take 80 mg by mouth 2 (two) times daily.    [provider]  venlafaxine XR (EFFEXOR-XR) 150 MG 24 hr capsule Take 150 mg by mouth  daily.    [provider]      Allergies    Atorvastatin, Simvastatin, Cephalosporins, Nickel, Sulfa antibiotics, Sulfasalazine, Eliquis [apixaban], and Xarelto [rivaroxaban]    Review of Systems   Review of Systems Review of systems {pos/neg:18640::"Negative","Positive"} for ***.  A 10 point review of systems was performed and is negative unless otherwise reported in HPI.  Physical Exam Updated Vital Signs BP (!) 159/72   Pulse 72   Ht  (1.6 m)   Wt 83.9 kg   SpO2 95%   BMI 32.77 kg/m  Physical Exam General: Normal appearing {Desc; female/female:11659}, lying in bed.  HEENT: PERRLA, Sclera  anicteric, MMM, trachea midline.  Cardiology: RRR, no murmurs/rubs/gallops. BL radial and DP pulses equal bilaterally.  Resp: Normal respiratory rate and effort. CTAB, no wheezes, rhonchi, crackles.  Abd: Soft, non-tender, non-distended. No rebound tenderness or guarding.  GU: Deferred. MSK: No peripheral edema or signs of trauma. Extremities without deformity or TTP. No cyanosis or clubbing. Skin: warm, dry. No rashes or lesions. Back: No CVA tenderness Neuro: A&Ox4, CNs II-XII grossly intact. MAEs. Sensation grossly intact.  Psych: Normal mood and affect.   ED Results / Procedures / Treatments   Labs (all labs ordered are listed, but only abnormal results are displayed) Labs Reviewed - No data to display  EKG None  Radiology No results found.  Procedures Procedures  {Document cardiac monitor, telemetry assessment procedure when appropriate:1}  Medications Ordered in ED Medications  lidocaine-EPINEPHrine (XYLOCAINE W/EPI) 2 %-1:200000 (PF) injection (  Not Given 01/04/23 2214)  ondansetron (ZOFRAN-ODT) disintegrating tablet 4 mg (has no administration in time range)  Tdap (BOOSTRIX) injection 0.5 mL (has no administration in time range)  lidocaine-EPINEPHrine (XYLOCAINE W/EPI) 2 %-1:200000 (PF) injection 20 mL (20 mLs Infiltration Given 01/04/23 2211)    ED Course/ Medical Decision Making/ A&P                          Medical Decision Making Amount and/or Complexity of Data Reviewed Radiology: ordered.  Risk Prescription drug management.    This patient presents to the ED for concern of ***, this involves an extensive number of treatment options, and is a complaint that carries with it a high risk of complications and morbidity.  I considered the following differential and admission for this acute, potentially life threatening condition.   MDM:    *** Laceration cleaned immediately upon arrival with 1 L normal saline and another liter normal saline mixed with  iodine. XR does not show any foreign body in laceration on R thigh and no fracture. repaired with 3-0 suture ***.      Labs: I Ordered, and personally interpreted labs.  The pertinent results include:  ***  Imaging Studies ordered: I ordered imaging studies including *** I independently visualized and interpreted imaging. I agree with the radiologist interpretation  Additional history obtained from ***.  External records from outside source obtained and reviewed including ***  Cardiac Monitoring: .The patient was maintained on a cardiac monitor.  I personally viewed and interpreted the cardiac monitored which showed an underlying rhythm of: ***  Reevaluation: After the interventions noted above, I reevaluated the patient and found that they have :{resolved/improved/worsened:23923::"improved"}  Social Determinants of Health: .***  Disposition:  ***  Co morbidities that complicate the patient evaluation . Past Medical History:  Diagnosis Date  . Anemia    years ago after surgery  . Asthma 1976  . Atrial fibrillation   .  Bronchitis   . Dysrhythmia    A-fib  . FUO (fever of unknown origin) 03/03/2015  . GERD (gastroesophageal reflux disease)   . Hepatitis 1980s  . Hepatitis    non A- non B  . HTN (hypertension)   . Hypercholesterolemia   . Night sweat 03/03/2015  . PMR (polymyalgia rheumatica) 03/15/2015  . 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 Polymyalgia 03/03/2015  . Polymyositis 03/03/2015  . Sleep apnea      Medicines Meds ordered this encounter  Medications  . DISCONTD: lidocaine-EPINEPHrine (XYLOCAINE W/EPI) 2 %-1:100000 (with pres) injection 30 mL  . lidocaine-EPINEPHrine (XYLOCAINE W/EPI) 2 %-1:200000 (PF) injection 20 mL  . lidocaine-EPINEPHrine (XYLOCAINE W/EPI) 2 %-1:200000 (PF) injection    Janee Morn, Aryana G: cabinet override  . ondansetron (ZOFRAN-ODT) disintegrating tablet 4 mg  . Tdap (BOOSTRIX) injection 0.5 mL    I have reviewed the patients home  medicines and have made adjustments as needed  Problem List / ED Course: Problem List Items Addressed This Visit   None        {Document critical care time when appropriate:1} {Document review of labs and clinical decision tools ie heart score, Chads2Vasc2 etc:1}  {Document your independent review of radiology images, and any outside records:1} {Document your discussion with family members, caretakers, and with consultants:1} {Document social determinants of health affecting pt's care:1} {Document your decision making why or why not admission, treatments were needed:1}  This note was created using dictation software, which may contain spelling or grammatical errors.

## 2023-01-04 NOTE — ED Triage Notes (Addendum)
Pt to ED POV after falling when trying to get up from the couch, cut L thigh on lamp, bleeding controlled at this time. Denies hitting head or LOC. Unknown last tetanus. ETOH on board, two episodes of emesis upon arrival.

## 2023-01-04 NOTE — Discharge Instructions (Addendum)
You will need to follow-up with a neurosurgeon for the 5 cm mass that was found on your brain MRI today.  This may be a type of cancer that needs close follow-up as an outpatient.  You should return to the ER if you experience severe worsening headache, strokelike symptoms, persistent vomiting, loss of vision, or confusion.  *  You are also treated for a large laceration or cut on your right thigh.  We repaired it with 9 sutures.  These will require removal in 7 days.  Please keep the area clean and dry for the next 48 hours, then you can shower regularly with soap and water.  Please watch for signs of infection including redness, increased pain, increased swelling, pus drainage, fever/chills.  You can visit [email or web address] to find a counselor who may help you decrease or stop drinking.  Please do not drink and drive.  Please get a ride home with her husband today.  Please follow up with your primary care provider within 1 week to have the sutures removed.   Do not hesitate to return to the ED or call 911 if you experience: -Worsening symptoms -Lightheadedness, passing out -Fevers/chills -Anything else that concerns you

## 2023-01-05 ENCOUNTER — Emergency Department (HOSPITAL_COMMUNITY): Payer: Medicare HMO

## 2023-01-05 DIAGNOSIS — G9389 Other specified disorders of brain: Secondary | ICD-10-CM | POA: Diagnosis not present

## 2023-01-05 DIAGNOSIS — G936 Cerebral edema: Secondary | ICD-10-CM | POA: Diagnosis not present

## 2023-01-05 DIAGNOSIS — Z043 Encounter for examination and observation following other accident: Secondary | ICD-10-CM | POA: Diagnosis not present

## 2023-01-05 DIAGNOSIS — C7931 Secondary malignant neoplasm of brain: Secondary | ICD-10-CM | POA: Diagnosis not present

## 2023-01-05 MED ORDER — LORAZEPAM 2 MG/ML IJ SOLN
1.0000 mg | Freq: Once | INTRAMUSCULAR | Status: AC
  Administered 2023-01-05: 1 mg via INTRAVENOUS
  Filled 2023-01-05: qty 1

## 2023-01-05 MED ORDER — GADOBUTROL 1 MMOL/ML IV SOLN
8.5000 mL | Freq: Once | INTRAVENOUS | Status: AC | PRN
  Administered 2023-01-05: 8.5 mL via INTRAVENOUS

## 2023-01-05 NOTE — ED Provider Notes (Signed)
I assumed care of this patient.  Please see previous provider's note for further details of history, exam, and MDM.   Briefly patient is a 77 y.o. female who presented as a transfer [facility] to obtain an MRI after an incidental finding on CT head revealing a right frontal mass with likely edema.  Patient had a fall while intoxicated at home prompting the CT.  She sustained laceration to the right thigh that was irrigated and closed by the previous provider. Patient denies any focal deficits, visual disturbance or gait instability.  Patient is MRI does confirm brain mass with edema.  Radiology believes this is meningioma.  Consulted neurosurgery who will evaluate the patient in the emergency department and provide recommendations.      Nira Conn, MD 01/05/23 [phone]

## 2023-01-05 NOTE — ED Notes (Signed)
Report given to charge RN at [facility] ED.

## 2023-01-05 NOTE — ED Notes (Signed)
Neurosurgeon MD at bedside. 

## 2023-01-05 NOTE — ED Notes (Signed)
Assumed care of pt brought to room via wc here after fall at home. Pt has laceration to right outer thigh. Pt states she had a little too much to drink with family got up to use restroom tripped and fell broke lamp causing laceration. Pt denies hitting head denies dizziness denies head ache or other injuries. PT a/o x 4 respirations even and non labored ambulatory with steady gait. Pt pending MRI reports she needs Ativan for exam as she is very claustrophobic.

## 2023-01-05 NOTE — ED Provider Notes (Signed)
Patient was received at handoff from Dr Eudelia Bunch, Briefly this is a 77 year old female who initially presented with concern for laceration to her lower extremity after a fall.  Laceration was repaired but the patient was found on CT imaging to have a large mass, and subsequent MRI to have a 5 cm mass with vasogenic edema consistent with malignancy.  There were no reported neurological deficits.  Dr. Eudelia Bunch had spoken to the on-call neurosurgeons who plan to come evaluate the patient at the bedside, and will provide further follow-up recommendations.  Physical Exam  BP (!) 136/52   Pulse 66   Temp 98.4 F (36.9 C) (Oral)   Resp 16   Ht 5\' 3"  (1.6 m)   Wt 83.9 kg   SpO2 97%   BMI 32.77 kg/m   Physical Exam  Procedures  Procedures  ED Course / MDM   Clinical Course as of 01/05/23 1109  Sat Jan 04, 2023  2336 CT Head Wo Contrast A 4.4 cm right frontal lobe mass with associated vasogenic edema leading to 6 mm right to left midline shift. Recommend MRI head with and without contrast for further evaluation of a metastasis versus primary malignancy.   [HN]  2339 Patient has no focal neurodeficits and has had no headaches.  Patient and her husband are informed of the findings of CT. [HN]    Clinical Course User Index [HN] Loetta Rough, MD   Medical Decision Making Amount and/or Complexity of Data Reviewed Radiology: ordered. Decision-making details documented in ED Course.  Risk Prescription drug management.   Per neurosurgery evaluation the patient is stable for discharge home and outpatient follow-up in the office.  I discussed with the patient and her entire family at the bedside.  At this time there is no indication for steroids per evaluation of the neurosurgery team, and the patient is already on chronic steroids for her other medical conditions.  She has no complaint of daily headaches, confusion, or any seizure-like activity per history.  Wound care discussed regarding  her laceration and need for suture removal.  Her family is taking her home       Terald Sleeper, MD 01/05/23 1109

## 2023-01-05 NOTE — Consult Note (Signed)
Reason for Consult:brain mass Referring Physician: edp  [name] [name] is an 77 y.o. female.   HPI:  77 year old female presented to the ED at [facility] after a fall. She reports having some right forehead headaches at times but nothing that wasn't manageable with OTC medications. Denies any other symptoms. Head CT shows a mass so she was transferred to [facility] for MRI.   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   Polymyalgia 03/03/2015   Polymyositis 03/03/2015   Sleep apnea     Past Surgical History:  Procedure Laterality Date   ABDOMINAL HYSTERECTOMY     APPENDECTOMY  09/24/1971   CHOLECYSTECTOMY N/A 10/08/2022   Procedure: LAPAROSCOPIC CHOLECYSTECTOMY;  Surgeon: Manus Rudd, MD;  Location: [facility] OR;  Service: General;  Laterality: N/A;   DG THUMB RIGHT HAND ([facility] HX)     DRUG INDUCED ENDOSCOPY N/A 11/14/2021   Procedure: DRUG INDUCED SLEEP ENDOSCOPY;  Surgeon: Christia Reading, MD;  Location: [facility];  Service: ENT;  Laterality: N/A;   FOOT SURGERY Left 09/24/2007   IMPLANTATION OF HYPOGLOSSAL NERVE STIMULATOR Right 12/18/2021   Procedure: IMPLANTATION OF HYPOGLOSSAL NERVE STIMULATOR;  Surgeon: Christia Reading, MD;  Location: [facility];  Service: ENT;  Laterality: Right;   INTRAOPERATIVE CHOLANGIOGRAM N/A 10/08/2022   Procedure: INTRAOPERATIVE CHOLANGIOGRAM;  Surgeon: Manus Rudd, MD;  Location: [facility] OR;  Service: General;  Laterality: N/A;   KNEE ARTHROPLASTY  09/23/2005   right   OVARY SURGERY  09/24/1999   TUBAL LIGATION  09/23/1970   VAGINAL HYSTERECTOMY  09/24/1971     Allergies  Allergen Reactions   Atorvastatin Other (See Comments)    Muscle cramps severe      Simvastatin Other (See Comments)    Severe muscle cramps   Cephalosporins     'ran a fever"   Nickel Rash   Sulfa Antibiotics Hives   Sulfasalazine Hives   Eliquis [Apixaban] Hives and Itching   Xarelto [Rivaroxaban] Hives and Itching    Social History   Tobacco Use   Smoking status: Former    Packs/day: 0.20    Years: 5.00    Additional pack years: 0.00    Total pack years: 1.00    Types: Cigarettes    Quit date: 09/23/1980    Years since quitting: 42.3   Smokeless tobacco: Never  Substance Use Topics   Alcohol use: Yes    Comment: 2 glasses of wine a day    Family History  Problem Relation Age of Onset   Hypertension Sister    Lung cancer Father    Hypertension Mother    Multiple sclerosis Daughter    Breast cancer Other        maternal aunt   Allergies Daughter    Allergies Daughter    Allergies Son      Review of Systems  Positive ROS: as above  All other systems have been reviewed and were otherwise negative with the exception of those mentioned in the HPI and as above.  Objective: Vital signs in last 24 hours: Temp:  [98.4 F (36.9 C)] 98.4 F (36.9 C) (04/14 0134) Pulse Rate:  [57-72] 66 (04/14 0930) Resp:  [16-18] 18 (04/14 0930) BP: (114-159)/(48-72) 135/60 (04/14 0930) SpO2:  [94 %-100 %] 96 % (04/14 0930) Weight:  [83.9 kg] 83.9 kg (04/13 2204)  General Appearance: Alert, cooperative, no distress, appears stated age Head: Normocephalic, without obvious abnormality, atraumatic Eyes: PERRL, conjunctiva/corneas clear, EOM's intact, fundi benign, both eyes      Lungs: respirations unlabored Heart: Regular rate and rhythm, Abdomen: Soft, non-tender, bowel sounds active all four quadrants, no masses, no organomegaly Extremities: Extremities normal, atraumatic, no cyanosis or edema Pulses: 2+ and symmetric all extremities Skin: Skin color,  texture, turgor normal, no rashes or lesions  NEUROLOGIC:   Mental status: A&O x4, no aphasia, good attention span, Memory and fund of knowledge Motor Exam - grossly normal, normal tone and bulk Sensory Exam - grossly normal Reflexes: symmetric, no pathologic reflexes, No Hoffman's, No clonus Coordination - grossly normal Gait - grossly normal Balance - grossly normal Cranial Nerves: I: smell Not tested  II: visual acuity  OS: na    OD: na  II: visual fields Full to confrontation  II: pupils Equal, round, reactive to light  III,VII: ptosis None  III,IV,VI: extraocular muscles  Full ROM  V: mastication Normal  V: facial light touch sensation  Normal  V,VII: corneal reflex  Present  VII: facial muscle function - upper  Normal  VII: facial muscle function - lower Normal  VIII: hearing Not tested  IX: soft palate elevation  Normal  IX,X: gag reflex Present  XI: trapezius strength  5/5  XI: sternocleidomastoid strength 5/5  XI: neck flexion strength  5/5  XII: tongue strength  Normal    Data Review Lab Results  Component Value Date   WBC 8.1 10/08/2022   HGB 13.9 10/08/2022   HCT 39.5 10/08/2022   MCV 95.6 10/08/2022   PLT 197 10/08/2022   Lab Results  Component Value Date   NA 137 10/08/2022   K 3.4 (L) 10/08/2022   CL 100 10/08/2022   CO2 24 10/08/2022   BUN 20 10/08/2022   CREATININE 0.86 10/08/2022   GLUCOSE 101 (H) 10/08/2022   No results found for: "INR", "PROTIME"  Radiology: MR BRAIN W WO CONTRAST  Result Date: 01/05/2023 CLINICAL DATA:  Brain metastases with unknown primary. EXAM: MRI HEAD WITHOUT AND WITH CONTRAST TECHNIQUE: Multiplanar, multiecho pulse sequences of the brain and surrounding structures were obtained without and with intravenous contrast. CONTRAST:  8.37mL GADAVIST GADOBUTROL 1 MMOL/ML IV SOLN COMPARISON:  Head CT from yesterday FINDINGS: Brain: Avidly enhancing mass along the anterior right frontal convexity which appears extra-axial with  broad dural base attachment. The mass measures up to 5.5 cm and causes pronounced local mass effect with moderate vasogenic edema. There is peripheral cystic change versus peritumoral cysts, margination is most discrete/sharp on sagittal postcontrast images with no suspected brain invasion. Midline shift measures nearly 9 mm anteriorly. No acute infarct, hemorrhage, or hydrocephalus. Vascular: Major flow voids and vascular enhancements are preserved Skull and upper cervical spine: No focal marrow lesion. Sinuses/Orbits: Bilateral maxillary sinusitis with mucosal thickening and complete opacification. IMPRESSION: 5.5 cm right frontal mass that appears extra-axial and most consistent with meningioma. Pronounced local mass effect with vasogenic edema. Electronically Signed   By: Tiburcio Pea M.D.   On: 01/05/2023 06:47   DG Chest Port 1 View  Result Date: 01/05/2023 CLINICAL DATA:  Fall EXAM: PORTABLE CHEST 1 VIEW COMPARISON:  12/18/2021 FINDINGS: Neuro stimulator with battery pack in the right chest. Normal heart size and mediastinal contours. No acute infiltrate or edema. No effusion or pneumothorax. No acute osseous findings. IMPRESSION: No acute finding. Electronically Signed   By: Tiburcio Pea M.D.   On: 01/05/2023 04:17   CT Head Wo Contrast  Result Date: 01/04/2023 CLINICAL DATA:  Head trauma, minor (Age >= 65y) Pt to ED POV after falling when trying to get up from the couch, cut L thigh on lamp, bleeding controlled at this time. Denies hitting head or LOC. Unknown last tetanus. ETOH on board, two episodes of emesis upon arrival. EXAM: CT HEAD WITHOUT CONTRAST TECHNIQUE: Contiguous axial images were obtained from the base of the skull through the vertex without intravenous contrast. RADIATION DOSE REDUCTION: This exam was performed according to the departmental dose-optimization program which includes automated exposure control, adjustment of the mA and/or kV according to patient size and/or use of  iterative reconstruction technique. COMPARISON:  None Available. 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 No evidence of large-territorial acute infarction. No parenchymal hemorrhage. At least 4.4 cm right frontal mass with associated white matter vasogenic edema. No extra-axial collection. Associated effacement of the right frontal sulci is well as 6 mm right to left midline shift. No hydrocephalus. Basilar cisterns are patent. Vascular: No hyperdense vessel. Skull: No acute fracture or focal lesion. Sinuses/Orbits: Paranasal sinuses and mastoid air cells are clear. The orbits are unremarkable. Other: None. IMPRESSION: A 4.4 cm right frontal lobe mass with associated vasogenic edema leading to 6 mm right to left midline shift. Recommend MRI head with and without contrast for further evaluation of a metastasis versus primary malignancy. Electronically Signed   By: Tish Frederickson M.D.   On: 01/04/2023 23:32   DG FEMUR, MIN 2 VIEWS RIGHT  Result Date: 01/04/2023 CLINICAL DATA:  Fall with cut to left thigh on lamp. EXAM: RIGHT FEMUR 2 VIEWS COMPARISON:  None Available. FINDINGS: There is no evidence of fracture or other focal bone lesions. Subcutaneous air and skin defect are noted over the lateral proximal thigh. No radiopaque foreign body is seen. IMPRESSION: No acute osseous abnormality or radiopaque foreign body. Electronically Signed   By: Thornell Sartorius M.D.   On: 01/04/2023 22:48     Assessment/Plan: 77 year old who presented to the ED after a fall. MRI brain shows a large right frontal mass consistent with meningioma. We discussed all of her options. She is wanting resection of this mass. She is able to be discharged home and we will follow up with her in the office in a couple weeks to plan surgical resection of this mass.   Tiana Loft [facility] 01/05/2023 10:13 AM

## 2023-01-05 NOTE — ED Notes (Signed)
RN assisted pt to restroom. Pt states she feels a little groggy that she think is from the ativan.

## 2023-01-05 NOTE — ED Notes (Signed)
MRI on hold until it can be determined what model number patients implanted INSPIRE device is and her remote is present MD aware. Spouse has gone home to get her remote and wallet containing device card

## 2023-01-10 DIAGNOSIS — G720 Drug-induced myopathy: Secondary | ICD-10-CM | POA: Diagnosis not present

## 2023-01-10 DIAGNOSIS — E119 Type 2 diabetes mellitus without complications: Secondary | ICD-10-CM | POA: Diagnosis not present

## 2023-01-10 DIAGNOSIS — W19XXXA Unspecified fall, initial encounter: Secondary | ICD-10-CM | POA: Diagnosis not present

## 2023-01-10 DIAGNOSIS — D329 Benign neoplasm of meninges, unspecified: Secondary | ICD-10-CM | POA: Diagnosis not present

## 2023-01-10 DIAGNOSIS — K8020 Calculus of gallbladder without cholecystitis without obstruction: Secondary | ICD-10-CM | POA: Diagnosis not present

## 2023-01-10 DIAGNOSIS — I10 Essential (primary) hypertension: Secondary | ICD-10-CM | POA: Diagnosis not present

## 2023-01-10 DIAGNOSIS — M8580 Other specified disorders of bone density and structure, unspecified site: Secondary | ICD-10-CM | POA: Diagnosis not present

## 2023-01-10 DIAGNOSIS — M353 Polymyalgia rheumatica: Secondary | ICD-10-CM | POA: Diagnosis not present

## 2023-01-10 DIAGNOSIS — I480 Paroxysmal atrial fibrillation: Secondary | ICD-10-CM | POA: Diagnosis not present

## 2023-01-10 DIAGNOSIS — G4733 Obstructive sleep apnea (adult) (pediatric): Secondary | ICD-10-CM | POA: Diagnosis not present

## 2023-01-10 DIAGNOSIS — E785 Hyperlipidemia, unspecified: Secondary | ICD-10-CM | POA: Diagnosis not present

## 2023-01-10 DIAGNOSIS — F1010 Alcohol abuse, uncomplicated: Secondary | ICD-10-CM | POA: Diagnosis not present

## 2023-01-14 DIAGNOSIS — D320 Benign neoplasm of cerebral meninges: Secondary | ICD-10-CM | POA: Diagnosis not present

## 2023-01-14 DIAGNOSIS — Z6833 Body mass index (BMI) 33.0-33.9, adult: Secondary | ICD-10-CM | POA: Diagnosis not present

## 2023-01-15 ENCOUNTER — Other Ambulatory Visit (HOSPITAL_COMMUNITY): Payer: Self-pay | Admitting: Neurological Surgery

## 2023-01-15 DIAGNOSIS — H5213 Myopia, bilateral: Secondary | ICD-10-CM | POA: Diagnosis not present

## 2023-01-15 DIAGNOSIS — D320 Benign neoplasm of cerebral meninges: Secondary | ICD-10-CM

## 2023-01-15 DIAGNOSIS — E119 Type 2 diabetes mellitus without complications: Secondary | ICD-10-CM | POA: Diagnosis not present

## 2023-01-15 DIAGNOSIS — H353131 Nonexudative age-related macular degeneration, bilateral, early dry stage: Secondary | ICD-10-CM | POA: Diagnosis not present

## 2023-01-15 DIAGNOSIS — Z961 Presence of intraocular lens: Secondary | ICD-10-CM | POA: Diagnosis not present

## 2023-01-17 ENCOUNTER — Other Ambulatory Visit: Payer: Self-pay | Admitting: Neurological Surgery

## 2023-01-17 ENCOUNTER — Telehealth: Payer: Self-pay | Admitting: *Deleted

## 2023-01-17 NOTE — Telephone Encounter (Signed)
   Name: [name] [name]  DOB: [birth year]  MRN: [id number]  Primary Cardiologist: Peter Swaziland, MD  Chart reviewed as part of pre-operative protocol coverage. Because of [name] [name]'s past medical history and time since last visit, she will require a follow-up in-office visit in order to better assess preoperative cardiovascular risk.  Pre-op covering staff: - Please schedule appointment and call patient to inform them. If patient already had an upcoming appointment within acceptable timeframe, please add "pre-op clearance" to the appointment notes so provider is aware. - Please contact requesting surgeon's office via preferred method (i.e, phone, fax) to inform them of need for appointment prior to surgery.  Per office protocol, if patient is without any new symptoms or concerns at the time of their visit, she may hold Aspirin for 5-7 days prior to procedure. Please resume Aspirin as soon as possible postprocedure, at the discretion of the surgeon.    Joylene Grapes, NP  01/17/2023, 3:49 PM

## 2023-01-17 NOTE — Telephone Encounter (Signed)
Pt is scheduled to see Bernadene Person, NP, 02/03/23.  Left pt a message that if she wanted to be seen sooner for clearance, she can call us back, otherwise, will let the surgeon's office know that she is scheduled for 02/03/23 and clearance will be addressed at that time.

## 2023-01-17 NOTE — Telephone Encounter (Signed)
   Pre-operative Risk Assessment    Patient Name: [name] [name]  DOB: [birth year] MRN: [id number]      Request for Surgical Clearance    Procedure:   Right Craniotomy for Meningioma  Date of Surgery:  Clearance 02/05/23                                 Surgeon:  Dr. Tia Alert Surgeon's Group or Practice Name:  [facility] Phone number:  [phone] Fax number:  [phone]   Type of Clearance Requested:   - Medical  - Pharmacy:  Hold Aspirin Not Indicated   Type of Anesthesia:  General    Additional requests/questions:    Signed, Emmit Pomfret   01/17/2023, 3:10 PM

## 2023-01-21 ENCOUNTER — Telehealth: Payer: Self-pay | Admitting: Pulmonary Disease

## 2023-01-21 ENCOUNTER — Ambulatory Visit (HOSPITAL_COMMUNITY)
Admission: RE | Admit: 2023-01-21 | Discharge: 2023-01-21 | Disposition: A | Discharge status: Home / Self Care | Payer: Medicare HMO | Source: Ambulatory Visit | Attending: Neurological Surgery | Admitting: Neurological Surgery

## 2023-01-21 DIAGNOSIS — G936 Cerebral edema: Secondary | ICD-10-CM | POA: Diagnosis not present

## 2023-01-21 DIAGNOSIS — D320 Benign neoplasm of cerebral meninges: Secondary | ICD-10-CM | POA: Insufficient documentation

## 2023-01-21 MED ORDER — GADOBUTROL 1 MMOL/ML IV SOLN
8.0000 mL | Freq: Once | INTRAVENOUS | Status: AC | PRN
  Administered 2023-01-21: 8 mL via INTRAVENOUS

## 2023-01-21 NOTE — Telephone Encounter (Signed)
OV notes and clearance form have been faxed back to [facility]. Nothing further needed at this time.  

## 2023-01-21 NOTE — Telephone Encounter (Signed)
Fax received from Dr. Marikay Alar with [facility] to perform a Right Craniotomy for Meningioma on patient.  Patient needs surgery clearance. Surgery is 02/05/23. Patient was seen on 07/17/22. Office protocol is a risk assessment can be sent to surgeon if patient has been seen in 60 days or less.   Sending to Dr. Vassie Loll for risk assessment or recommendations if patient needs to be seen in office prior to surgical procedure.    Dr. Vassie Loll patient has OV scheduled 01/27/23

## 2023-01-27 ENCOUNTER — Ambulatory Visit (HOSPITAL_BASED_OUTPATIENT_CLINIC_OR_DEPARTMENT_OTHER): Payer: Medicare HMO | Admitting: Pulmonary Disease

## 2023-01-27 ENCOUNTER — Encounter (HOSPITAL_BASED_OUTPATIENT_CLINIC_OR_DEPARTMENT_OTHER): Payer: Self-pay | Admitting: Pulmonary Disease

## 2023-01-27 VITALS — BP 154/62 | HR 64 | Temp 98.0°F | Ht 64.0 in | Wt 191.0 lb

## 2023-01-27 DIAGNOSIS — J4520 Mild intermittent asthma, uncomplicated: Secondary | ICD-10-CM

## 2023-01-27 DIAGNOSIS — G4733 Obstructive sleep apnea (adult) (pediatric): Secondary | ICD-10-CM | POA: Diagnosis not present

## 2023-01-27 NOTE — Assessment & Plan Note (Signed)
Well-controlled on current regimen. Use albuterol as needed

## 2023-01-27 NOTE — Patient Instructions (Signed)
[name] [name] is working well Good luck with surgery Please have them contact me for questions

## 2023-01-27 NOTE — Progress Notes (Signed)
   Subjective:    Patient ID: [name] [name], female    DOB: 21-Mar-1946, 77 y.o.   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 Polymyalgia rheumatica was diagnosed in 2016, good response to steroids   She was intolerant of CPAP and underwent placement of hypoglossal nerve stimulator implant on 12/18/2021    Activation settings:  Sensation was at 0.5 V.    She underwent inspire titration study. Incoming amplitude was 1.3 V , good control of events was noted at 1.2 and 1.3 V and there was over-activation when increased to 1.5 V   She increased amplitude to 1.6 V and had increased hoarseness We reset her remote to 1.2 V with a range of 1.0-1.4 She has 1 hour start delay, pause time of 15 minutes and a duration of 10 hours  33-month follow-up visit She had a fall at home and head CT unfortunately showed meningioma, confirmed by MRI which showed 5.3 cm right frontal meningioma with a 6 mm right to left shift.  Resection is planned for May 15.  She presents for surgical clearance. She is very compliant with her inspire device.  She uses the pause functionality once or twice every night when she wakes up to go to the bathroom.  Sleep onset is still delayed and sometimes almost 2 to 3 hours before she falls asleep especially if she has an earlier bedtime around 8 PM. She is maintained on low-dose prednisone for polymyalgia rheumatica She drinks a glass of wine every night but has never experienced withdrawal symptoms  Asthma is well-controlled and she rarely needs albuterol  Significant tests/ events reviewed inspire titration study 04/24/22 >>  Incoming amplitude was 1.3 V , good control of events was noted at 1.2 and 1.3 V and there was over-activation when increased to 1.5 V   08/2022 HST >> AHI of 8/hour, events predominantly in supine sleep, low  saturation of 88% On inspire 1.2 v   HST 05/2017 AHI 32/h   Review of Systems neg for any significant sore throat, dysphagia, itching, sneezing, nasal congestion or excess/ purulent secretions, fever, chills, sweats, unintended wt loss, pleuritic or exertional cp, hempoptysis, orthopnea pnd or change in chronic leg swelling. Also denies presyncope, palpitations, heartburn, abdominal pain, nausea, vomiting, diarrhea or change in bowel or urinary habits, dysuria,hematuria, rash, arthralgias, visual complaints, headache, numbness weakness or ataxia.     Objective:   Physical Exam   Gen. Pleasant, obese, in no distress ENT - no lesions, no post nasal drip Neck: No JVD, no thyromegaly, no carotid bruits Lungs: no use of accessory muscles, no dullness to percussion, decreased without rales or rhonchi  Cardiovascular: Rhythm regular, heart sounds  normal, no murmurs or gallops, no peripheral edema Musculoskeletal: No deformities, no cyanosis or clubbing , no tremors        Assessment & Plan:

## 2023-01-27 NOTE — Assessment & Plan Note (Addendum)
Download was reviewed which shows excellent compliance about 8.5 hours every night.  Multiple use of positive buttons and start delay. I have asked her to aim for a later bedtime around 10 to 11 PM so that sleep onset will be faster.  We will recheck her therapy duration next visit and if she is sleeping less then we will shorten her duration from current setting up to 10 hours.  She is using therapy "all night and every night. She is cleared for neurosurgery from pulmonary standpoint.  She should be allowed to use her remote during her hospital stay We reviewed home sleep test which showed residual mild OSA with AHI 8/hour

## 2023-01-29 NOTE — Progress Notes (Signed)
Surgical Instructions    Your procedure is scheduled on Wednesday, 02/05/23.  Report to [facility] Main Entrance "A" at 6:30 A.M., then check in with the Admitting office.  Call this number if you have problems the morning of surgery:  [phone]   If you have any questions prior to your surgery date call [phone]: Open Monday-Friday 8am-4pm If you experience any cold or flu symptoms such as cough, fever, chills, shortness of breath, etc. between now and your scheduled surgery, please notify us at the above number     Remember:  Do not eat or drink after midnight the night before your surgery     Take these medicines the morning of surgery with A SIP OF WATER:  ezetimibe (ZETIA)  montelukast (SINGULAIR)  predniSONE (DELTASONE)  sotalol (BETAPACE)  venlafaxine XR (EFFEXOR-XR)   IF NEEDED: albuterol (PROAIR HFA) inhaler- bring with you the day of surgery fluticasone (FLONASE)  omeprazole (PRILOSEC)   As of today, STOP taking any Aspirin (unless otherwise instructed by your surgeon) Aleve, Naproxen, Ibuprofen, Motrin, Advil, Goody's, BC's, all herbal medications, fish oil, and all vitamins.           Do not wear jewelry or makeup. Do not wear lotions, powders, perfumes/cologne or deodorant. Do not shave 48 hours prior to surgery.   Do not bring valuables to the hospital. Do not wear nail polish, gel polish, artificial nails, or any other type of covering on natural nails (fingers and toes) If you have artificial nails or gel coating that need to be removed by a nail salon, please have this removed prior to surgery. Artificial nails or gel coating may interfere with anesthesia's ability to adequately monitor your vital signs.  [facility] is not responsible for any belongings or valuables.    Do NOT Smoke (Tobacco/Vaping)  24 hours prior to your procedure  If you use a CPAP at night, you may bring your mask for your overnight stay.   Contacts, glasses, hearing aids,  dentures or partials may not be worn into surgery, please bring cases for these belongings   For patients admitted to the hospital, discharge time will be determined by your treatment team.   Patients discharged the day of surgery will not be allowed to drive home, and someone needs to stay with them for 24 hours.   SURGICAL WAITING ROOM VISITATION Patients having surgery or a procedure may have no more than 2 support people in the waiting area - these visitors may rotate.   Children under the age of 24 must have an adult with them who is not the patient. If the patient needs to stay at the hospital during part of their recovery, the visitor guidelines for inpatient rooms apply. Pre-op nurse will coordinate an appropriate time for 1 support person to accompany patient in pre-op.  This support person may not rotate.   Please refer to [email or web address] for the visitor guidelines for Inpatients (after your surgery is over and you are in a regular room).    Special instructions:    Oral Hygiene is also important to reduce your risk of infection.  Remember - BRUSH YOUR TEETH THE MORNING OF SURGERY WITH YOUR REGULAR TOOTHPASTE   [facility]- Preparing For Surgery  Before surgery, you can play an important role. Because skin is not sterile, your skin needs to be as free of germs as possible. You can reduce the number of germs on your skin by washing with CHG (chlorahexidine gluconate) Soap before surgery.  CHG is an antiseptic cleaner which kills germs and bonds with the skin to continue killing germs even after washing.     Please do not use if you have an allergy to CHG or antibacterial soaps. If your skin becomes reddened/irritated stop using the CHG.  Do not shave (including legs and underarms) for at least 48 hours prior to first CHG shower. It is OK to shave your face.  Please follow these instructions carefully.     Shower the NIGHT  BEFORE SURGERY and the MORNING OF SURGERY with CHG Soap.   If you chose to wash your hair, wash your hair first as usual with your normal shampoo. After you shampoo, rinse your hair and body thoroughly to remove the shampoo.  Then [employer] and genitals (private parts) with your normal soap and rinse thoroughly to remove soap.  After that Use CHG Soap as you would any other liquid soap. You can apply CHG directly to the skin and wash gently with a scrungie or a clean washcloth.   Apply the CHG Soap to your body ONLY FROM THE NECK DOWN.  Do not use on open wounds or open sores. Avoid contact with your eyes, ears, mouth and genitals (private parts). Wash Face and genitals (private parts)  with your normal soap.   Wash thoroughly, paying special attention to the area where your surgery will be performed.  Thoroughly rinse your body with warm water from the neck down.  DO NOT shower/wash with your normal soap after using and rinsing off the CHG Soap.  Pat yourself dry with a CLEAN TOWEL.  Wear CLEAN PAJAMAS to bed the night before surgery  Place CLEAN SHEETS on your bed the night before your surgery  DO NOT SLEEP WITH PETS.   Day of Surgery: Take a shower with CHG soap. Wear Clean/Comfortable clothing the morning of surgery Do not apply any deodorants/lotions.   Remember to brush your teeth WITH YOUR REGULAR TOOTHPASTE.    If you received a COVID test during your pre-op visit, it is requested that you wear a mask when out in public, stay away from anyone that may not be feeling well, and notify your surgeon if you develop symptoms. If you have been in contact with anyone that has tested positive in the last 10 days, please notify your surgeon.    Please read over the following fact sheets that you were given.

## 2023-01-30 ENCOUNTER — Encounter (HOSPITAL_COMMUNITY)
Admission: RE | Admit: 2023-01-30 | Discharge: 2023-01-30 | Disposition: A | Discharge status: Home / Self Care | Payer: Medicare HMO | Source: Ambulatory Visit | Attending: Neurological Surgery | Admitting: Neurological Surgery

## 2023-01-30 ENCOUNTER — Other Ambulatory Visit: Payer: Self-pay

## 2023-01-30 ENCOUNTER — Encounter (HOSPITAL_COMMUNITY): Payer: Self-pay

## 2023-01-30 VITALS — BP 132/48 | HR 61 | Temp 97.9°F | Ht 64.0 in | Wt 191.8 lb

## 2023-01-30 DIAGNOSIS — Z01812 Encounter for preprocedural laboratory examination: Secondary | ICD-10-CM | POA: Diagnosis not present

## 2023-01-30 DIAGNOSIS — Z01818 Encounter for other preprocedural examination: Secondary | ICD-10-CM

## 2023-01-30 DIAGNOSIS — Z79899 Other long term (current) drug therapy: Secondary | ICD-10-CM | POA: Insufficient documentation

## 2023-01-30 LAB — CBC
HCT: 38.9 % (ref 36.0–46.0)
Hemoglobin: 13.1 g/dL (ref 12.0–15.0)
MCH: 32.9 pg (ref 26.0–34.0)
MCHC: 33.7 g/dL (ref 30.0–36.0)
MCV: 97.7 fL (ref 80.0–100.0)
Platelets: 181 10*3/uL (ref 150–400)
RBC: 3.98 MIL/uL (ref 3.87–5.11)
RDW: 12.6 % (ref 11.5–15.5)
WBC: 11.2 10*3/uL — ABNORMAL HIGH (ref 4.0–10.5)
nRBC: 0 % (ref 0.0–0.2)

## 2023-01-30 LAB — COMPREHENSIVE METABOLIC PANEL
ALT: 21 U/L (ref 0–44)
AST: 24 U/L (ref 15–41)
Albumin: 3.9 g/dL (ref 3.5–5.0)
Alkaline Phosphatase: 70 U/L (ref 38–126)
Anion gap: 12 (ref 5–15)
BUN: 26 mg/dL — ABNORMAL HIGH (ref 8–23)
CO2: 27 mmol/L (ref 22–32)
Calcium: 9.5 mg/dL (ref 8.9–10.3)
Chloride: 98 mmol/L (ref 98–111)
Creatinine, Ser: 1.11 mg/dL — ABNORMAL HIGH (ref 0.44–1.00)
GFR, Estimated: 51 mL/min — ABNORMAL LOW (ref >60)
Glucose, Bld: 113 mg/dL — ABNORMAL HIGH (ref 70–99)
Potassium: 3.3 mmol/L — ABNORMAL LOW (ref 3.5–5.1)
Sodium: 137 mmol/L (ref 135–145)
Total Bilirubin: 0.8 mg/dL (ref 0.3–1.2)
Total Protein: 7.3 g/dL (ref 6.5–8.1)

## 2023-01-30 LAB — PROTIME-INR
INR: 1 (ref 0.8–1.2)
Prothrombin Time: 13.6 seconds (ref 11.4–15.2)

## 2023-01-30 LAB — TYPE AND SCREEN
ABO/RH(D): O POS
Antibody Screen: NEGATIVE

## 2023-01-30 NOTE — Progress Notes (Signed)
PCP - Dr. Chilton Greathouse Cardiologist - Dr. Peter Swaziland  PPM/ICD - n/a  Chest x-ray - n/a EKG - 11/18/22 Stress Test - 30+ years ago-reported normal ECHO - 30+ years ago-reported normal Cardiac Cath - denies  Sleep Study - OSA+ CPAP - denies, pt has an Insurance account manager Implant  Blood Thinner Instructions: n/a Aspirin Instructions: n/a  NPO at MD.  COVID TEST- n/a  Anesthesia review: No  Patient denies shortness of breath, fever, cough and chest pain at PAT appointment   All instructions explained to the patient, with a verbal understanding of the material. Patient agrees to go over the instructions while at home for a better understanding. Patient also instructed to self quarantine after being tested for COVID-19. The opportunity to ask questions was provided.

## 2023-02-03 ENCOUNTER — Encounter: Payer: Self-pay | Admitting: General Practice

## 2023-02-03 ENCOUNTER — Ambulatory Visit: Payer: Medicare HMO | Admitting: Nurse Practitioner

## 2023-02-03 ENCOUNTER — Ambulatory Visit: Payer: Medicare HMO | Attending: Nurse Practitioner | Admitting: General Practice

## 2023-02-03 VITALS — BP 132/62 | HR 60 | Ht 64.0 in | Wt 190.6 lb

## 2023-02-03 DIAGNOSIS — I480 Paroxysmal atrial fibrillation: Secondary | ICD-10-CM

## 2023-02-03 DIAGNOSIS — Z01810 Encounter for preprocedural cardiovascular examination: Secondary | ICD-10-CM

## 2023-02-03 DIAGNOSIS — I10 Essential (primary) hypertension: Secondary | ICD-10-CM

## 2023-02-03 DIAGNOSIS — E782 Mixed hyperlipidemia: Secondary | ICD-10-CM

## 2023-02-03 DIAGNOSIS — M353 Polymyalgia rheumatica: Secondary | ICD-10-CM | POA: Diagnosis not present

## 2023-02-03 NOTE — Patient Instructions (Signed)
Medication Instructions:  MAY STOP ASPIRIN 5-7 DAYS BEFORE CRANIOTOMY  *If you need a refill on your cardiac medications before your next appointment, please call your pharmacy*  Lab Work: NONE If you have labs (blood work) drawn today and your tests are completely normal, you will receive your results only by:  MyChart Message (if you have MyChart) OR A paper copy in the mail If you have any lab test that is abnormal or we need to change your treatment, we will call you to review the results.  Other Instructions PLEASE READ AND FOLLOW ATTACHED  SALTY 6   Follow-Up: At [facility], you and your health needs are our priority.  As part of our continuing mission to provide you with exceptional heart care, we have created designated Provider Care Teams.  These Care Teams include your primary Cardiologist (physician) and Advanced Practice Providers (APPs -  Physician Assistants and Nurse Practitioners) who all work together to provide you with the care you need, when you need it.  Your next appointment:   4-6 month(s)  Provider:   Peter Swaziland, MD

## 2023-02-03 NOTE — Progress Notes (Signed)
Cardiology Clinic Note   Patient Name: [name] [name] Date of Encounter: 02/03/2023  Primary Care Provider:  Chilton Greathouse, MD Primary Cardiologist:  Peter Swaziland, MD  Patient Profile    [name] [name] 77 year old female presents the clinic today for preoperative cardiac evaluation.  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   Polymyalgia (HCC) 03/03/2015   Polymyositis (HCC) 03/03/2015   Sleep apnea    Past Surgical History:  Procedure Laterality Date   ABDOMINAL HYSTERECTOMY     APPENDECTOMY  09/24/1971   CHOLECYSTECTOMY N/A 10/08/2022   Procedure: LAPAROSCOPIC CHOLECYSTECTOMY;  Surgeon: Manus Rudd, MD;  Location: [facility] OR;  Service: General;  Laterality: N/A;   DG THUMB RIGHT HAND ([facility] HX)     DRUG INDUCED ENDOSCOPY N/A 11/14/2021   Procedure: DRUG INDUCED SLEEP ENDOSCOPY;  Surgeon: Christia Reading, MD;  Location: [facility];  Service: ENT;  Laterality: N/A;   FOOT SURGERY Left 09/24/2007   IMPLANTATION OF HYPOGLOSSAL NERVE STIMULATOR Right 12/18/2021   Procedure: IMPLANTATION OF HYPOGLOSSAL NERVE STIMULATOR;  Surgeon: Christia Reading, MD;  Location: [facility];  Service: ENT;  Laterality: Right;   INTRAOPERATIVE CHOLANGIOGRAM N/A 10/08/2022   Procedure: INTRAOPERATIVE CHOLANGIOGRAM;  Surgeon: Manus Rudd, MD;  Location: [facility] OR;  Service: General;  Laterality: N/A;   KNEE ARTHROSCOPY  09/23/2005   right   OVARY SURGERY  09/24/1999   TUBAL LIGATION  09/23/1970   VAGINAL HYSTERECTOMY  09/24/1971     Allergies  Allergies  Allergen Reactions   Atorvastatin Other (See Comments)    Muscle cramps severe      Simvastatin Other (See Comments)    Severe muscle cramps   Cephalosporins     'ran a fever"   Nickel Rash   Sulfa Antibiotics Hives   Sulfasalazine Hives   Eliquis [Apixaban] Hives and Itching   Xarelto [Rivaroxaban] Hives and Itching    History of Present Illness    [name] [name] has a PMH of paroxysmal atrial fibrillation not on anticoagulation, SVT, PVCs, HTN, HLD, OSA status post inspire, polymyalgia rheumatica, and GERD.  She was initially diagnosed with atrial fibrillation and placed on metoprolol.  This caused significant fatigue.  She also noted fatigue with calcium channel blockers.  She was transition to sotalol.  She is not a candidate for anticoagulation due to reaction to Eliquis and Xarelto.  She declined Pradaxa.  Her cardiac event monitor 2020 showed PVCs, NSVT, PSVT and no evidence of atrial fibrillation.  Her palpitations resolved with magnesium supplementation.  She was seen in the cardiology clinic 09/05/2022.  She was stable from a cardiac standpoint at that time.  She underwent laparoscopic cholecystectomy 10/08/2022.  She was seen in follow-up 11/18/2022.  During that time she continued to be stable from a cardiac standpoint.  She did note 1 episode of sudden onset of dizziness 1 week prior.  She also reported chest tightness.  She felt that she was in atrial fibrillation.  She was not wearing her Apple Watch.  Her symptoms are approximately were 20 minutes in duration and resolve spontaneously.  She denied further episodes.  She presented to the hospital on 01/04/2023 with a deep laceration to her lower extremity and a right frontal lobe mass.  She had a fall while intoxicated at home.  She had a head CT which showed a 4.4 cm right frontal lobe mass with associated vasogenic edema leading to 6 mm right to left midline shift.  She complained of daily  headaches confusion and seizure-like activity.  Her family was taking care of her at home.  She presents to the clinic today for follow-up evaluation and preoperative cardiac evaluation.  She states she continues to heal.  We reviewed her recent hospitalization and frontal hematoma.  She expressed understanding.  Her blood pressure is well-controlled.  Her EKG today shows normal sinus rhythm nonspecific T wave abnormality 60 bpm.  She is able to complete greater than 4 METS of physical activity.  She is hoping to make it to the beach this summer.  I will continue her current medication regimen and we will plan follow-up in 4 to 6 months.  Today she denies chest pain, shortness of breath, lower extremity edema, fatigue, palpitations, melena, hematuria, hemoptysis, diaphoresis, weakness, presyncope, syncope, orthopnea, and PND.      Home Medications    Prior to Admission medications   Medication Sig Start Date End Date Taking? Authorizing Provider  albuterol (PROAIR HFA) 108 (90 Base) MCG/ACT inhaler Inhale 2 puffs into the lungs every 6 (six) hours as needed. Patient taking differently: Inhale 2 puffs into the lungs every 6 (six) hours as needed for wheezing or shortness of breath. 09/14/18   Oretha Milch, MD  b complex vitamins capsule Take 1 capsule by mouth daily.    [provider]  chlorthalidone (HYGROTON) 25 MG tablet Take 1 tablet (25 mg total) by mouth daily. 03/06/16   Swaziland, Peter M, MD  diphenhydrAMINE (BENADRYL) 25 MG tablet Take 25 mg by mouth See admin instructions. Every other night for sleep    [provider]  doxylamine, Sleep, (UNISOM) 25 MG tablet Take 25 mg by mouth See admin instructions. Every other night for sleep    [provider]  estradiol (VIVELLE-DOT) 0.05 MG/24HR patch Place 1 patch onto the skin 2 (two) times a week. 08/29/19   [provider]  ezetimibe (ZETIA) 10 MG tablet TAKE 1 TABLET BY MOUTH EVERY DAY 07/24/22   Sw[place]iland,  Peter M, MD  fluticasone [facility]) 50 MCG/ACT nasal spray Place 2 sprays into both nostrils daily as needed for allergies. 08/28/19   [provider]  irbesartan (AVAPRO) 300 MG tablet Take 300 mg by mouth daily.    [provider]  Magnesium Oxide (MAG-OXIDE PO) Take 200 mg by mouth at bedtime.    [provider]  montelukast (SINGULAIR) 10 MG tablet Take 10 mg by mouth daily. 10/17/19   [provider]  Multiple Vitamin (MULTI-VITAMINS) TABS Take 1 tablet by mouth daily.     [provider]  Multiple Vitamins-Minerals (LUTEIN-ZEAXANTHIN PO) Take 1 tablet by mouth daily.    [provider]  omeprazole (PRILOSEC) 20 MG capsule Take 20 mg by mouth daily as needed.    [provider]  OVER THE COUNTER MEDICATION Take 2 tablets by mouth at bedtime. Restful monk sleep supplement (without melatonin)    [provider]  predniSONE (DELTASONE) 5 MG tablet Take 5 mg  by mouth daily with breakfast.    [provider]  sotalol (BETAPACE) 80 MG tablet Take 80 mg by mouth 2 (two) times daily.    [provider]  traZODone (DESYREL) 50 MG tablet Take 50 mg by mouth at bedtime. 01/10/23   [provider]  venlafaxine XR (EFFEXOR-XR) 150 MG 24 hr capsule Take 150 mg by mouth daily.    [provider]  Vitamin D, Ergocalciferol, (DRISDOL) 1.25 MG (50000 UNIT) CAPS capsule Take 50,000 Units by mouth once a week. Monday 10/21/22   [provider]    Family History    Family History  Problem Relation Age of Onset   Hypertension Sister    Lung cancer Father    Hypertension Mother    Multiple sclerosis Daughter    Breast cancer Other        maternal aunt   Allergies Daughter    Allergies Daughter    Allergies Son    She indicated that her mother is deceased. She indicated that her father is deceased. She indicated that her sister is alive. She indicated that her maternal grandmother is deceased.  She indicated that her maternal grandfather is deceased. She indicated that her paternal grandmother is deceased. She indicated that her paternal grandfather is deceased. She indicated that the status of her son is unknown. She indicated that the status of her other is unknown.  Social History    Social History   Socioeconomic History   Marital status: Married    Spouse name: Not on file   Number of children: 3   Years of education: Not on file   Highest education level: Not on file  Occupational History   Occupation: RN    Employer: RETIRED  Tobacco Use   Smoking status: Former    Packs/day: 0.20    Years: 5.00    Additional pack years: 0.00    Total pack years: 1.00    Types: Cigarettes    Quit date: 09/23/1980    Years since quitting: 42.3   Smokeless tobacco: Never  Vaping Use   Vaping Use: Never used  Substance and Sexual Activity   Alcohol use: Yes    Comment: 1 glass of wine a day   Drug use: No   Sexual activity: Not Currently    Birth control/protection: Surgical  Other Topics Concern   Not on file  Social History Narrative   Not on file   Social Determinants of Health   Financial Resource Strain: Not on file  Food Insecurity: Not on file  Transportation Needs: Not on file  Physical Activity: Not on file  Stress: Not on file  Social Connections: Not on file  Intimate Partner Violence: Not on file     Review of Systems    General:  No chills, fever, night sweats or weight changes.  Cardiovascular:  No chest pain, dyspnea on exertion, edema, orthopnea, palpitations, paroxysmal nocturnal dyspnea. Dermatological: No rash, lesions/masses Respiratory: No cough, dyspnea Urologic: No hematuria, dysuria Abdominal:   No nausea, vomiting, diarrhea, bright red blood per rectum, melena, or hematemesis Neurologic:  No visual changes, wkns, changes in mental status. All other systems reviewed and are otherwise negative except as noted above.  Physical Exam    VS:   BP 132/62   Pulse 60   Ht 5\' 4"  (1.626 m)   Wt 190 lb 9.6 oz (86.5 kg)   SpO2 100%   BMI 32.72 kg/m  , BMI Body mass index is  32.72 kg/m. GEN: Well nourished, well developed, in no acute distress. HEENT: normal. Neck: Supple, no JVD, carotid bruits, or masses. Cardiac: RRR, no murmurs, rubs, or gallops. No clubbing, cyanosis, edema.  Radials/DP/PT 2+ and equal bilaterally.  Respiratory:  Respirations regular and unlabored, clear to auscultation bilaterally. GI: Soft, nontender, nondistended, BS + x 4. MS: no deformity or atrophy. Skin: warm and dry, no rash. Neuro:  Strength and sensation are intact. Psych: Normal affect.  Accessory Clinical Findings    Recent Labs: 01/30/2023: ALT 21; BUN 26; Creatinine, Ser 1.11; Hemoglobin 13.1; Platelets 181; Potassium 3.3; Sodium 137   Recent Lipid Panel No results found for: "CHOL", "TRIG", "HDL", "CHOLHDL", "VLDL", "LDLCALC", "LDLDIRECT"       ECG personally reviewed by me today-normal sinus rhythm nonspecific T wave abnormality 60 bpm- No acute changes  Cardiac monitor 04/2019: Normal sinus rhythm Rare PVCs, trigeminy One 6 beat run of NSVT Rare PACs. 6 runs of SVT. longest 18 seconds at rate max 152. symptomatic.    Assessment & Plan   1.  Paroxysmal atrial fibrillation-EKG today shows normal sinus rhythm nonspecific T wave abnormality 60 bpm.  Not a candidate for anticoagulation.  Had reaction with Xarelto and Eliquis.  Does not want to try Pradaxa. Continue sotalol Continue to monitor with Apple Watch  Essential hypertension-BP today 132/62. Low-sodium diet Increase physical activity as tolerated Continue current medical therapy Maintain blood pressure log  Hyperlipidemia-LDL followed by PCP High-fiber diet Continue aspirin, ezetimibe, Praluent  Polymyalgia rheumatica-on chronic steroids. Continue current treatment  Preoperative cardiac evaluation-craniotomy, Dr. Marikay Alar, 02/05/2023    Primary Cardiologist:  Peter Swaziland, MD  Chart reviewed as part of pre-operative protocol coverage. Given past medical history and time since last visit, based on ACC/AHA guidelines, SHENA MARCHESI would be at acceptable risk for the planned procedure without further cardiovascular testing.   Her RCRI is a class I risk, 0.4% risk of major cardiac event.  She is able to complete greater than 4 METS of physical activity.  Her aspirin may be held for 5 to 7 days prior to her procedure.  Please resume aspirin as soon as hemostasis is achieved at the discretion of the surgeon.  Patient was advised that if she develops new symptoms prior to surgery to contact our office to arrange a follow-up appointment.  She verbalized understanding.  I will route this recommendation to the requesting party via Epic fax function and remove from pre-op pool.   Disposition: Follow-up with Dr. Swaziland or me in 4-6 months.   Thomasene Ripple. Camauri Fleece NP-C     02/03/2023, 9:37 AM [facility] [address] Office [phone] Fax [phone]    I spent 14 minutes examining this patient, reviewing medications, and using patient centered shared decision making involving her cardiac care.  Prior to her visit I spent greater than 20 minutes reviewing her past medical history,  medications, and prior cardiac tests.

## 2023-02-05 ENCOUNTER — Inpatient Hospital Stay (HOSPITAL_COMMUNITY): Payer: Medicare HMO | Admitting: Anesthesiology

## 2023-02-05 ENCOUNTER — Other Ambulatory Visit: Payer: Self-pay

## 2023-02-05 ENCOUNTER — Inpatient Hospital Stay (HOSPITAL_COMMUNITY)
Admission: RE | Admit: 2023-02-05 | Discharge: 2023-02-07 | DRG: 025 | Disposition: A | Discharge status: Home / Self Care | Payer: Medicare HMO | Attending: Neurological Surgery | Admitting: Neurological Surgery

## 2023-02-05 ENCOUNTER — Encounter (HOSPITAL_COMMUNITY): Payer: Self-pay | Admitting: Neurological Surgery

## 2023-02-05 ENCOUNTER — Encounter (HOSPITAL_COMMUNITY)
Admission: RE | Disposition: A | Discharge status: Home / Self Care | Payer: Self-pay | Source: Home / Self Care | Attending: Neurological Surgery

## 2023-02-05 DIAGNOSIS — Z882 Allergy status to sulfonamides status: Secondary | ICD-10-CM | POA: Diagnosis not present

## 2023-02-05 DIAGNOSIS — Z90710 Acquired absence of both cervix and uterus: Secondary | ICD-10-CM | POA: Diagnosis not present

## 2023-02-05 DIAGNOSIS — Z7952 Long term (current) use of systemic steroids: Secondary | ICD-10-CM | POA: Diagnosis not present

## 2023-02-05 DIAGNOSIS — I4891 Unspecified atrial fibrillation: Secondary | ICD-10-CM | POA: Diagnosis present

## 2023-02-05 DIAGNOSIS — Z888 Allergy status to other drugs, medicaments and biological substances status: Secondary | ICD-10-CM

## 2023-02-05 DIAGNOSIS — K219 Gastro-esophageal reflux disease without esophagitis: Secondary | ICD-10-CM | POA: Diagnosis present

## 2023-02-05 DIAGNOSIS — J45909 Unspecified asthma, uncomplicated: Secondary | ICD-10-CM | POA: Diagnosis not present

## 2023-02-05 DIAGNOSIS — Z79899 Other long term (current) drug therapy: Secondary | ICD-10-CM

## 2023-02-05 DIAGNOSIS — Z803 Family history of malignant neoplasm of breast: Secondary | ICD-10-CM | POA: Diagnosis not present

## 2023-02-05 DIAGNOSIS — Z9049 Acquired absence of other specified parts of digestive tract: Secondary | ICD-10-CM | POA: Diagnosis not present

## 2023-02-05 DIAGNOSIS — Z801 Family history of malignant neoplasm of trachea, bronchus and lung: Secondary | ICD-10-CM | POA: Diagnosis not present

## 2023-02-05 DIAGNOSIS — Z87891 Personal history of nicotine dependence: Secondary | ICD-10-CM | POA: Diagnosis not present

## 2023-02-05 DIAGNOSIS — G4730 Sleep apnea, unspecified: Secondary | ICD-10-CM | POA: Diagnosis not present

## 2023-02-05 DIAGNOSIS — Z8249 Family history of ischemic heart disease and other diseases of the circulatory system: Secondary | ICD-10-CM

## 2023-02-05 DIAGNOSIS — D329 Benign neoplasm of meninges, unspecified: Secondary | ICD-10-CM | POA: Diagnosis not present

## 2023-02-05 DIAGNOSIS — E7800 Pure hypercholesterolemia, unspecified: Secondary | ICD-10-CM | POA: Diagnosis present

## 2023-02-05 DIAGNOSIS — D320 Benign neoplasm of cerebral meninges: Principal | ICD-10-CM | POA: Diagnosis present

## 2023-02-05 DIAGNOSIS — G936 Cerebral edema: Secondary | ICD-10-CM | POA: Diagnosis not present

## 2023-02-05 DIAGNOSIS — I10 Essential (primary) hypertension: Secondary | ICD-10-CM | POA: Diagnosis present

## 2023-02-05 DIAGNOSIS — Z883 Allergy status to other anti-infective agents status: Secondary | ICD-10-CM | POA: Diagnosis not present

## 2023-02-05 DIAGNOSIS — Z820 Family history of epilepsy and other diseases of the nervous system: Secondary | ICD-10-CM | POA: Diagnosis not present

## 2023-02-05 DIAGNOSIS — M353 Polymyalgia rheumatica: Secondary | ICD-10-CM | POA: Diagnosis not present

## 2023-02-05 DIAGNOSIS — Z9889 Other specified postprocedural states: Principal | ICD-10-CM

## 2023-02-05 HISTORY — PX: CRANIOTOMY: SHX93

## 2023-02-05 HISTORY — PX: APPLICATION OF CRANIAL NAVIGATION: SHX6578

## 2023-02-05 LAB — ABO/RH: ABO/RH(D): O POS

## 2023-02-05 SURGERY — CRANIOTOMY TUMOR EXCISION
Anesthesia: General | Laterality: Right

## 2023-02-05 MED ORDER — LACTATED RINGERS IV SOLN: INTRAVENOUS | Status: DC | PRN

## 2023-02-05 MED ORDER — FENTANYL CITRATE (PF) 250 MCG/5ML IJ SOLN
INTRAMUSCULAR | Status: AC
  Filled 2023-02-05: qty 5

## 2023-02-05 MED ORDER — ROCURONIUM BROMIDE 10 MG/ML (PF) SYRINGE
PREFILLED_SYRINGE | INTRAVENOUS | Status: AC
  Filled 2023-02-05: qty 20

## 2023-02-05 MED ORDER — DEXAMETHASONE SODIUM PHOSPHATE 10 MG/ML IJ SOLN
INTRAMUSCULAR | Status: AC
  Filled 2023-02-05: qty 1

## 2023-02-05 MED ORDER — MONTELUKAST SODIUM 10 MG PO TABS
10.0000 mg | ORAL_TABLET | Freq: Every day | ORAL | Status: DC
  Administered 2023-02-05 – 2023-02-07 (×3): 10 mg via ORAL
  Filled 2023-02-05 (×3): qty 1

## 2023-02-05 MED ORDER — SODIUM CHLORIDE 0.9 % IV SOLN: INTRAVENOUS | Status: DC

## 2023-02-05 MED ORDER — ACETAMINOPHEN 325 MG PO TABS
650.0000 mg | ORAL_TABLET | ORAL | Status: DC | PRN
  Administered 2023-02-05 – 2023-02-06 (×2): 650 mg via ORAL
  Filled 2023-02-05 (×2): qty 2

## 2023-02-05 MED ORDER — PANTOPRAZOLE SODIUM 40 MG IV SOLR
40.0000 mg | Freq: Every day | INTRAVENOUS | Status: DC
  Administered 2023-02-05: 40 mg via INTRAVENOUS
  Filled 2023-02-05: qty 10

## 2023-02-05 MED ORDER — ALBUTEROL SULFATE (2.5 MG/3ML) 0.083% IN NEBU: 3.0000 mL | INHALATION_SOLUTION | Freq: Four times a day (QID) | RESPIRATORY_TRACT | Status: DC | PRN

## 2023-02-05 MED ORDER — HEMOSTATIC AGENTS (NO CHARGE) OPTIME
TOPICAL | Status: DC | PRN
  Administered 2023-02-05: 1

## 2023-02-05 MED ORDER — VENLAFAXINE HCL ER 150 MG PO CP24
150.0000 mg | ORAL_CAPSULE | Freq: Every day | ORAL | Status: DC
  Administered 2023-02-06 – 2023-02-07 (×2): 150 mg via ORAL
  Filled 2023-02-05 (×2): qty 1

## 2023-02-05 MED ORDER — ACETAMINOPHEN 325 MG PO TABS: 325.0000 mg | ORAL_TABLET | ORAL | Status: DC | PRN

## 2023-02-05 MED ORDER — ACETAMINOPHEN 650 MG RE SUPP: 650.0000 mg | RECTAL | Status: DC | PRN

## 2023-02-05 MED ORDER — FENTANYL CITRATE (PF) 100 MCG/2ML IJ SOLN
INTRAMUSCULAR | Status: DC | PRN
  Administered 2023-02-05 (×2): 100 ug via INTRAVENOUS
  Administered 2023-02-05: 50 ug via INTRAVENOUS

## 2023-02-05 MED ORDER — LEVETIRACETAM IN NACL 500 MG/100ML IV SOLN
500.0000 mg | Freq: Two times a day (BID) | INTRAVENOUS | Status: DC
  Administered 2023-02-05 – 2023-02-06 (×2): 500 mg via INTRAVENOUS
  Filled 2023-02-05 (×2): qty 100

## 2023-02-05 MED ORDER — LIDOCAINE 2% (20 MG/ML) 5 ML SYRINGE
INTRAMUSCULAR | Status: DC | PRN
  Administered 2023-02-05: 40 mg via INTRAVENOUS

## 2023-02-05 MED ORDER — THROMBIN 5000 UNITS EX SOLR
CUTANEOUS | Status: AC
  Filled 2023-02-05: qty 5000

## 2023-02-05 MED ORDER — SOTALOL HCL 80 MG PO TABS
80.0000 mg | ORAL_TABLET | Freq: Two times a day (BID) | ORAL | Status: DC
  Administered 2023-02-05 – 2023-02-07 (×4): 80 mg via ORAL
  Filled 2023-02-05 (×5): qty 1

## 2023-02-05 MED ORDER — ORAL CARE MOUTH RINSE: 15.0000 mL | Freq: Once | OROMUCOSAL | Status: AC

## 2023-02-05 MED ORDER — HYDROCODONE-ACETAMINOPHEN 5-325 MG PO TABS
1.0000 | ORAL_TABLET | ORAL | Status: DC | PRN
  Administered 2023-02-05 – 2023-02-07 (×7): 1 via ORAL
  Filled 2023-02-05 (×7): qty 1

## 2023-02-05 MED ORDER — CHLORHEXIDINE GLUCONATE CLOTH 2 % EX PADS: 6.0000 | MEDICATED_PAD | Freq: Once | CUTANEOUS | Status: DC

## 2023-02-05 MED ORDER — ONDANSETRON HCL 4 MG/2ML IJ SOLN
INTRAMUSCULAR | Status: AC
  Filled 2023-02-05: qty 2

## 2023-02-05 MED ORDER — ADULT MULTIVITAMIN W/MINERALS CH
1.0000 | ORAL_TABLET | Freq: Every day | ORAL | Status: DC
  Administered 2023-02-05 – 2023-02-07 (×3): 1 via ORAL
  Filled 2023-02-05 (×3): qty 1

## 2023-02-05 MED ORDER — LIDOCAINE-EPINEPHRINE 1 %-1:100000 IJ SOLN
INTRAMUSCULAR | Status: AC
  Filled 2023-02-05: qty 1

## 2023-02-05 MED ORDER — VANCOMYCIN HCL 750 MG/150ML IV SOLN
750.0000 mg | INTRAVENOUS | Status: DC
  Filled 2023-02-05: qty 150

## 2023-02-05 MED ORDER — THROMBIN 20000 UNITS EX SOLR
CUTANEOUS | Status: DC | PRN
  Administered 2023-02-05: 20000 [IU] via TOPICAL

## 2023-02-05 MED ORDER — CHLORTHALIDONE 25 MG PO TABS
25.0000 mg | ORAL_TABLET | Freq: Every day | ORAL | Status: DC
  Administered 2023-02-05 – 2023-02-07 (×3): 25 mg via ORAL
  Filled 2023-02-05 (×3): qty 1

## 2023-02-05 MED ORDER — PHENYLEPHRINE 80 MCG/ML (10ML) SYRINGE FOR IV PUSH (FOR BLOOD PRESSURE SUPPORT)
PREFILLED_SYRINGE | INTRAVENOUS | Status: DC | PRN
  Administered 2023-02-05: 160 ug via INTRAVENOUS
  Administered 2023-02-05 (×3): 80 ug via INTRAVENOUS

## 2023-02-05 MED ORDER — LIDOCAINE-EPINEPHRINE 1 %-1:100000 IJ SOLN
INTRAMUSCULAR | Status: DC | PRN
  Administered 2023-02-05: 10 mL

## 2023-02-05 MED ORDER — PROMETHAZINE HCL 25 MG PO TABS: 12.5000 mg | ORAL_TABLET | ORAL | Status: DC | PRN

## 2023-02-05 MED ORDER — PROPOFOL 10 MG/ML IV BOLUS
INTRAVENOUS | Status: AC
  Filled 2023-02-05: qty 20

## 2023-02-05 MED ORDER — CHLORHEXIDINE GLUCONATE 0.12 % MT SOLN
15.0000 mL | Freq: Once | OROMUCOSAL | Status: AC
  Administered 2023-02-05: 15 mL via OROMUCOSAL
  Filled 2023-02-05: qty 15

## 2023-02-05 MED ORDER — BACITRACIN ZINC 500 UNIT/GM EX OINT
TOPICAL_OINTMENT | CUTANEOUS | Status: AC
  Filled 2023-02-05: qty 28.35

## 2023-02-05 MED ORDER — DEXAMETHASONE SODIUM PHOSPHATE 10 MG/ML IJ SOLN
INTRAMUSCULAR | Status: DC | PRN
  Administered 2023-02-05: 10 mg via INTRAVENOUS

## 2023-02-05 MED ORDER — ACETAMINOPHEN 10 MG/ML IV SOLN: 1000.0000 mg | Freq: Once | INTRAVENOUS | Status: DC | PRN

## 2023-02-05 MED ORDER — AMISULPRIDE (ANTIEMETIC) 5 MG/2ML IV SOLN: 10.0000 mg | Freq: Once | INTRAVENOUS | Status: DC | PRN

## 2023-02-05 MED ORDER — FENTANYL CITRATE (PF) 100 MCG/2ML IJ SOLN
INTRAMUSCULAR | Status: AC
  Filled 2023-02-05: qty 2

## 2023-02-05 MED ORDER — SUGAMMADEX SODIUM 200 MG/2ML IV SOLN
INTRAVENOUS | Status: DC | PRN
  Administered 2023-02-05: 200 mg via INTRAVENOUS

## 2023-02-05 MED ORDER — SENNA 8.6 MG PO TABS
1.0000 | ORAL_TABLET | Freq: Two times a day (BID) | ORAL | Status: DC
  Administered 2023-02-05 – 2023-02-07 (×5): 8.6 mg via ORAL
  Filled 2023-02-05 (×5): qty 1

## 2023-02-05 MED ORDER — PHENYLEPHRINE 80 MCG/ML (10ML) SYRINGE FOR IV PUSH (FOR BLOOD PRESSURE SUPPORT)
PREFILLED_SYRINGE | INTRAVENOUS | Status: AC
  Filled 2023-02-05: qty 10

## 2023-02-05 MED ORDER — ROCURONIUM BROMIDE 10 MG/ML (PF) SYRINGE
PREFILLED_SYRINGE | INTRAVENOUS | Status: DC | PRN
  Administered 2023-02-05: 20 mg via INTRAVENOUS
  Administered 2023-02-05: 40 mg via INTRAVENOUS
  Administered 2023-02-05: 60 mg via INTRAVENOUS
  Administered 2023-02-05: 10 mg via INTRAVENOUS

## 2023-02-05 MED ORDER — ONDANSETRON HCL 4 MG/2ML IJ SOLN: 4.0000 mg | INTRAMUSCULAR | Status: DC | PRN

## 2023-02-05 MED ORDER — EZETIMIBE 10 MG PO TABS
10.0000 mg | ORAL_TABLET | Freq: Every day | ORAL | Status: DC
  Administered 2023-02-06 – 2023-02-07 (×2): 10 mg via ORAL
  Filled 2023-02-05 (×2): qty 1

## 2023-02-05 MED ORDER — POTASSIUM CHLORIDE IN NACL 20-0.9 MEQ/L-% IV SOLN
INTRAVENOUS | Status: DC
  Filled 2023-02-05 (×2): qty 1000

## 2023-02-05 MED ORDER — FENTANYL CITRATE (PF) 100 MCG/2ML IJ SOLN
25.0000 ug | INTRAMUSCULAR | Status: DC | PRN
  Administered 2023-02-05 (×3): 50 ug via INTRAVENOUS

## 2023-02-05 MED ORDER — IRBESARTAN 150 MG PO TABS
300.0000 mg | ORAL_TABLET | Freq: Every day | ORAL | Status: DC
  Administered 2023-02-05 – 2023-02-07 (×3): 300 mg via ORAL
  Filled 2023-02-05 (×3): qty 2

## 2023-02-05 MED ORDER — DEXAMETHASONE SODIUM PHOSPHATE 4 MG/ML IJ SOLN
4.0000 mg | Freq: Four times a day (QID) | INTRAMUSCULAR | Status: AC
  Administered 2023-02-06 – 2023-02-07 (×4): 4 mg via INTRAVENOUS
  Filled 2023-02-05 (×4): qty 1

## 2023-02-05 MED ORDER — 0.9 % SODIUM CHLORIDE (POUR BTL) OPTIME
TOPICAL | Status: DC | PRN
  Administered 2023-02-05: 3000 mL

## 2023-02-05 MED ORDER — THROMBIN 20000 UNITS EX SOLR
CUTANEOUS | Status: AC
  Filled 2023-02-05: qty 20000

## 2023-02-05 MED ORDER — ONDANSETRON HCL 4 MG PO TABS: 4.0000 mg | ORAL_TABLET | ORAL | Status: DC | PRN

## 2023-02-05 MED ORDER — SODIUM CHLORIDE 0.9 % IV SOLN
0.1500 ug/kg/min | INTRAVENOUS | Status: AC
  Administered 2023-02-05: .15 ug/kg/min via INTRAVENOUS
  Filled 2023-02-05: qty 2000

## 2023-02-05 MED ORDER — THROMBIN 5000 UNITS EX SOLR
CUTANEOUS | Status: DC | PRN
  Administered 2023-02-05 (×2): 5000 [IU] via TOPICAL

## 2023-02-05 MED ORDER — DEXAMETHASONE SODIUM PHOSPHATE 10 MG/ML IJ SOLN
6.0000 mg | Freq: Four times a day (QID) | INTRAMUSCULAR | Status: AC
  Administered 2023-02-05 – 2023-02-06 (×4): 6 mg via INTRAVENOUS
  Filled 2023-02-05 (×4): qty 1

## 2023-02-05 MED ORDER — LORAZEPAM 1 MG PO TABS
1.0000 mg | ORAL_TABLET | Freq: Once | ORAL | Status: AC | PRN
  Administered 2023-02-06 (×2): 1 mg via ORAL
  Filled 2023-02-05: qty 2

## 2023-02-05 MED ORDER — ACETAMINOPHEN 500 MG PO TABS
1000.0000 mg | ORAL_TABLET | Freq: Once | ORAL | Status: AC
  Administered 2023-02-05: 1000 mg via ORAL
  Filled 2023-02-05: qty 2

## 2023-02-05 MED ORDER — LABETALOL HCL 5 MG/ML IV SOLN: 10.0000 mg | INTRAVENOUS | Status: DC | PRN

## 2023-02-05 MED ORDER — ONDANSETRON HCL 4 MG/2ML IJ SOLN
INTRAMUSCULAR | Status: DC | PRN
  Administered 2023-02-05: 4 mg via INTRAVENOUS

## 2023-02-05 MED ORDER — DEXAMETHASONE SODIUM PHOSPHATE 4 MG/ML IJ SOLN: 4.0000 mg | Freq: Three times a day (TID) | INTRAMUSCULAR | Status: DC

## 2023-02-05 MED ORDER — MORPHINE SULFATE (PF) 2 MG/ML IV SOLN: 1.0000 mg | INTRAVENOUS | Status: DC | PRN

## 2023-02-05 MED ORDER — PHENYLEPHRINE HCL-NACL 20-0.9 MG/250ML-% IV SOLN
INTRAVENOUS | Status: DC | PRN
  Administered 2023-02-05: 30 ug/min via INTRAVENOUS

## 2023-02-05 MED ORDER — PROPOFOL 10 MG/ML IV BOLUS
INTRAVENOUS | Status: DC | PRN
  Administered 2023-02-05: 30 mg via INTRAVENOUS
  Administered 2023-02-05: 50 mg via INTRAVENOUS
  Administered 2023-02-05: 150 mg via INTRAVENOUS

## 2023-02-05 MED ORDER — VANCOMYCIN HCL IN DEXTROSE 1-5 GM/200ML-% IV SOLN
1000.0000 mg | INTRAVENOUS | Status: AC
  Administered 2023-02-05: 1000 mg via INTRAVENOUS
  Filled 2023-02-05: qty 200

## 2023-02-05 MED ORDER — MAGNESIUM OXIDE -MG SUPPLEMENT 400 (240 MG) MG PO TABS
200.0000 mg | ORAL_TABLET | Freq: Every day | ORAL | Status: DC
  Administered 2023-02-05 – 2023-02-06 (×2): 200 mg via ORAL
  Filled 2023-02-05 (×3): qty 1

## 2023-02-05 MED ORDER — OXYCODONE HCL 5 MG/5ML PO SOLN: 5.0000 mg | Freq: Once | ORAL | Status: DC | PRN

## 2023-02-05 MED ORDER — ACETAMINOPHEN 160 MG/5ML PO SOLN: 325.0000 mg | ORAL | Status: DC | PRN

## 2023-02-05 MED ORDER — OXYCODONE HCL 5 MG PO TABS: 5.0000 mg | ORAL_TABLET | Freq: Once | ORAL | Status: DC | PRN

## 2023-02-05 MED ORDER — BACITRACIN ZINC 500 UNIT/GM EX OINT
TOPICAL_OINTMENT | CUTANEOUS | Status: DC | PRN
  Administered 2023-02-05: 1 via TOPICAL

## 2023-02-05 SURGICAL SUPPLY — 67 items
BAG COUNTER SPONGE SURGICOUNT (BAG) ×1 IMPLANT
BAG SPNG CNTER NS LX DISP (BAG) ×1
BLADE CLIPPER SPEC (BLADE) ×1 IMPLANT
BUR ACORN 6.0 PRECISION (BURR) IMPLANT
BUR SPIRAL ROUTER 2.3 (BUR) IMPLANT
CANISTER SUCT 3000ML PPV (MISCELLANEOUS) ×1 IMPLANT
CLIP TI MEDIUM 6 (CLIP) IMPLANT
CNTNR URN SCR LID CUP LEK RST (MISCELLANEOUS) ×1 IMPLANT
CONT SPEC 4OZ STRL OR WHT (MISCELLANEOUS) ×1
COVER BACK TABLE 60X90IN (DRAPES) IMPLANT
COVER BURR HOLE UNIV 10 (Orthopedic Implant) IMPLANT
COVERAGE SUPPORT O-ARM STEALTH (MISCELLANEOUS) ×1 IMPLANT
DRAPE MICROSCOPE SLANT 54X150 (MISCELLANEOUS) IMPLANT
DRAPE NEUROLOGICAL W/INCISE (DRAPES) ×1 IMPLANT
DRAPE SURG 17X23 STRL (DRAPES) IMPLANT
DRAPE WARM FLUID 44X44 (DRAPES) ×1 IMPLANT
DURAGUARD 06CMX08CM IMPLANT
DURAPREP 6ML APPLICATOR 50/CS (WOUND CARE) ×1 IMPLANT
ELECT REM PT RETURN 9FT ADLT (ELECTROSURGICAL) ×1
ELECTRODE REM PT RTRN 9FT ADLT (ELECTROSURGICAL) ×1 IMPLANT
EVACUATOR 1/8 PVC DRAIN (DRAIN) IMPLANT
FEE COVERAGE SUPPORT O-ARM (MISCELLANEOUS) ×1 IMPLANT
GAUZE 4X4 16PLY ~~LOC~~+RFID DBL (SPONGE) IMPLANT
GAUZE SPONGE 4X4 12PLY STRL (GAUZE/BANDAGES/DRESSINGS) IMPLANT
GLOVE BIO SURGEON STRL SZ7 (GLOVE) IMPLANT
GLOVE BIO SURGEON STRL SZ8 (GLOVE) ×1 IMPLANT
GLOVE BIOGEL PI IND STRL 7.0 (GLOVE) IMPLANT
GOWN STRL REUS W/ TWL LRG LVL3 (GOWN DISPOSABLE) IMPLANT
GOWN STRL REUS W/ TWL XL LVL3 (GOWN DISPOSABLE) IMPLANT
GOWN STRL REUS W/TWL 2XL LVL3 (GOWN DISPOSABLE) ×1 IMPLANT
GOWN STRL REUS W/TWL LRG LVL3 (GOWN DISPOSABLE)
GOWN STRL REUS W/TWL XL LVL3 (GOWN DISPOSABLE)
HEMOSTAT POWDER KIT SURGIFOAM (HEMOSTASIS) IMPLANT
KIT BASIN OR (CUSTOM PROCEDURE TRAY) ×1 IMPLANT
KIT TURNOVER KIT B (KITS) ×1 IMPLANT
MARKER SPHERE PSV REFLC NDI (MISCELLANEOUS) ×3 IMPLANT
NEEDLE HYPO 22GX1.5 SAFETY (NEEDLE) ×1 IMPLANT
NS IRRIG 1000ML POUR BTL (IV SOLUTION) ×1 IMPLANT
PACK BATTERY CMF DISP FOR DVR (ORTHOPEDIC DISPOSABLE SUPPLIES) IMPLANT
PACK CRANIOTOMY CUSTOM (CUSTOM PROCEDURE TRAY) ×1 IMPLANT
PAD ARMBOARD 7.5X6 YLW CONV (MISCELLANEOUS) ×1 IMPLANT
PATTIES SURGICAL .25X.25 (GAUZE/BANDAGES/DRESSINGS) IMPLANT
PATTIES SURGICAL .5 X.5 (GAUZE/BANDAGES/DRESSINGS) IMPLANT
PATTIES SURGICAL .5 X3 (DISPOSABLE) IMPLANT
PATTIES SURGICAL 1X1 (DISPOSABLE) IMPLANT
PERFORATOR LRG  14-11MM (BIT) ×1
PERFORATOR LRG 14-11MM (BIT) ×1 IMPLANT
PIN MAYFIELD SKULL DISP (PIN) IMPLANT
PLATE CRANIAL 12 2H RIGID UNI (Plate) IMPLANT
SCREW UNIII AXS SD 1.5X4 (Screw) IMPLANT
SOL ELECTROSURG ANTI STICK (MISCELLANEOUS) ×1
SOLUTION ELECTROSURG ANTI STCK (MISCELLANEOUS) ×2 IMPLANT
SPECIMEN JAR SMALL (MISCELLANEOUS) IMPLANT
SPONGE NEURO XRAY DETECT 1X3 (DISPOSABLE) IMPLANT
SPONGE SURGIFOAM ABS GEL 100 (HEMOSTASIS) ×1 IMPLANT
STAPLER VISISTAT 35W (STAPLE) IMPLANT
SUT ETHILON 3 0 FSL (SUTURE) IMPLANT
SUT NURALON 4 0 TR CR/8 (SUTURE) ×2 IMPLANT
SUT VIC AB 2-0 CP2 18 (SUTURE) ×1 IMPLANT
SYR CONTROL 10ML LL (SYRINGE) ×1 IMPLANT
TAPE CLOTH SURG 4X10 WHT LF (GAUZE/BANDAGES/DRESSINGS) IMPLANT
TOWEL GREEN STERILE (TOWEL DISPOSABLE) ×1 IMPLANT
TOWEL GREEN STERILE FF (TOWEL DISPOSABLE) ×1 IMPLANT
TRAY FOLEY MTR SLVR 16FR STAT (SET/KITS/TRAYS/PACK) IMPLANT
TUBING FEATHERFLOW (TUBING) IMPLANT
UNDERPAD 30X36 HEAVY ABSORB (UNDERPADS AND DIAPERS) IMPLANT
WATER STERILE IRR 1000ML POUR (IV SOLUTION) ×1 IMPLANT

## 2023-02-05 NOTE — Op Note (Signed)
02/05/2023  11:45 AM  PATIENT:  [name] [name]  77 y.o. female  PRE-OPERATIVE DIAGNOSIS: Large right frontal meningioma  POST-OPERATIVE DIAGNOSIS:  same  PROCEDURE: Right frontal craniotomy for resection of large right frontal meningioma utilizing Stealth frameless stereotactic guidance  SURGEON:  Marikay Alar, MD  ASSISTANTS: Dr. Franky Macho  ANESTHESIA:   General  EBL: 150 ml  Total I/O In: 1000 [I.V.:1000] Out: 360 [Urine:210; Blood:150]  BLOOD ADMINISTERED: none  DRAINS: Medium Hemovac  SPECIMEN: Tumor sent en bloc to pathology for permanent  INDICATION FOR PROCEDURE: This patient presented with mild right frontal headaches after a fall. Imaging showed large right frontal meningioma. Recommended frontal craniotomy for resection of the lesion. Patient understood the risks, benefits, and alternatives and potential outcomes and wished to proceed.  PROCEDURE DETAILS: The patient was taken to the operating room and after induction of adequate generalized endotracheal anesthesia, the head was affixed in a 3 point Mayfield head rest, and turned to the left to expose the right frontotemporal parietal region.  We used Stealth frameless stereotactic navigation and registered our points and had excellent accuracy when we checked our points.  Plantar incision based on this.  The head was shaved and then cleaned and then prepped with DuraPrep and draped in the usual sterile fashion. 10 cc of local anesthetic was injected, and a right frontal incision was made on the right of the head in the midline to the zygoma.Derrek Monaco clips were placed to establish hemostasis of the scalp, the muscle was reflected with the scalp flap, to expose the right frontal region.  Marked our midline over the sinus, A burr hole was placed over the sinus, and a craniotomy flap was turned utilizing the high-speed, air powered drill. The flap was then placed in bacitracin-containing saline solution, and the dura was opened to  expose the underlying tumor.  Cut the dura circumferentially around the tumor and removed the attached dura with the tumor.  A small corticectomy was created with the bipolar forceps, and the underlying tumor was then identified and we continue to work the plane between the brain and the tumor circumferentially.  Any small bridging vessels were coagulated and cut sharply with microscissors.  We continued to work circumferentially around the tumor staying in the plane between the brain and the tumor.  Finally able to remove the tumor en bloc and this was sent to pathology for permanent section.. Once the tumor was removed I dried the surgical bed with bipolar cautery and  Surgifoam, and then lined the surgical bed with Surgicel.  We used a dural substitute and sewed this to the dural edges.  There was a small breach into the right frontal sinus and therefore I exonerated the sinus, packed some muscle from the temporalis muscle into the sinus and covered this with bone wax.  I dissected the paracranium and flapped this down over the breach in the sinus and tied it to the dural substitute.  The dura was lined with Gelfoam, and the craniotomy flap was replaced with doggie-bone plates. The wound was copiously irrigated. A subgaleal drain was placed, and the galea was then closed with interrupted 2-0 Vicryl suture. The skin was then closed with staples a sterile dressing was applied. The patient was then taken out of the 3-point Mayfield headrest and awakened from general anesthesia, and transported to the recovery room in stable condition. At the end of the procedure all sponge, needle, and instrument counts were correct.    PLAN OF CARE:  Admit to inpatient   PATIENT DISPOSITION:  PACU - hemodynamically stable.   Delay start of Pharmacological VTE agent (>24hrs) due to surgical blood loss or risk of bleeding:  yes

## 2023-02-05 NOTE — Anesthesia Procedure Notes (Signed)
Arterial Line Insertion Start/End5/15/2024 8:00 AM, 02/05/2023 8:05 AM Performed by: De Nurse, CRNA  Patient location: Pre-op. Left, radial was placed Catheter size: 20 G Hand hygiene performed  and maximum sterile barriers used   Attempts: 2 Procedure performed without using ultrasound guided technique. Following insertion, dressing applied and Biopatch. Post procedure assessment: normal and unchanged  Patient tolerated the procedure well with no immediate complications.

## 2023-02-05 NOTE — Transfer of Care (Signed)
Immediate Anesthesia Transfer of Care Note  Patient: [name] [name]  Procedure(s) Performed: Craniotomy - right for meningioma - Frontal (Right) APPLICATION OF CRANIAL NAVIGATION (Right)  Patient Location: PACU  Anesthesia Type:General  Level of Consciousness: awake, alert , and oriented  Airway & Oxygen Therapy: Patient Spontanous Breathing  Post-op Assessment: Report given to RN  Post vital signs: Reviewed and stable  Last Vitals:  Vitals Value Taken Time  BP 129/51 02/05/23 1148  Temp    Pulse 72 02/05/23 1154  Resp 13 02/05/23 1154  SpO2 92 % 02/05/23 1154  Vitals shown include unvalidated device data.  Last Pain:  Vitals:   02/05/23 0737  TempSrc:   PainSc: 1       Patients Stated Pain Goal: 1 (02/05/23 0737)  Complications: There were no known notable events for this encounter.

## 2023-02-05 NOTE — Anesthesia Preprocedure Evaluation (Addendum)
Anesthesia Evaluation  Patient identified by MRN, date of birth, ID band Patient awake    Reviewed: Allergy & Precautions, NPO status , Patient's Chart, lab work & pertinent test results  Airway Mallampati: II  TM Distance: >3 FB Neck ROM: Full    Dental  (+) Teeth Intact, Dental Advisory Given   Pulmonary asthma , sleep apnea , former smoker   breath sounds clear to auscultation       Cardiovascular hypertension, Pt. on medications + dysrhythmias Atrial Fibrillation  Rhythm:Regular Rate:Normal     Neuro/Psych negative neurological ROS  negative psych ROS   GI/Hepatic ,GERD  Medicated,,(+) Hepatitis -  Endo/Other  negative endocrine ROS    Renal/GU negative Renal ROS     Musculoskeletal negative musculoskeletal ROS (+)    Abdominal   Peds  Hematology   Anesthesia Other Findings   Reproductive/Obstetrics                             Anesthesia Physical Anesthesia Plan  ASA: 3  Anesthesia Plan: General   Post-op Pain Management: Tylenol PO (pre-op)*   Induction: Intravenous  PONV Risk Score and Plan: 4 or greater and Ondansetron and Treatment may vary due to age or medical condition  Airway Management Planned: Oral ETT  Additional Equipment: Arterial line  Intra-op Plan:   Post-operative Plan: Extubation in OR  Informed Consent: I have reviewed the patients History and Physical, chart, labs and discussed the procedure including the risks, benefits and alternatives for the proposed anesthesia with the patient or authorized representative who has indicated his/her understanding and acceptance.     Dental advisory given  Plan Discussed with: CRNA  Anesthesia Plan Comments:        Anesthesia Quick Evaluation

## 2023-02-05 NOTE — Progress Notes (Signed)
Spoke with on call neurosurgeon Dr. Franky Macho on the unit. Notified him that patient stated she is claustrophobic with MRI machines. Verbal order for 1-2 mg of ativan once PRN for MRI.

## 2023-02-05 NOTE — Progress Notes (Signed)
Pharmacy Antibiotic Note  [name] [name] is a 77 y.o. female admitted on 02/05/2023 with R frontal meningioma.  Pharmacy has been consulted for vancomycin dosing for surgical prophylaxis s/p craniotomy, subgaleal drain placed..  Plan: Received vancomycin 1g IV pre-op, will continue with 750mg  IV q24h for estimated AUC 506 using SCr 1.11 from 01/30/23 Follow up LOT - check vancomycin levels at steady state if continues, goal AUC 400-550 Follow up renal function   Height: 5\' 4"  (162.6 cm) Weight: 86.2 kg (190 lb) IBW/kg (Calculated) : 54.7  Temp (24hrs), Avg:98.1 F (36.7 C), Min:97.8 F (36.6 C), Max:98.2 F (36.8 C)  Recent Labs  Lab 01/30/23 1200  WBC 11.2*  CREATININE 1.11*    Estimated Creatinine Clearance: 44.4 mL/min (A) (by C-G formula based on SCr of 1.11 mg/dL (H)).    Allergies  Allergen Reactions   Atorvastatin Other (See Comments)    Muscle cramps severe      Simvastatin Other (See Comments)    Severe muscle cramps   Cephalosporins     'ran a fever"   Nickel Rash   Sulfa Antibiotics Hives   Sulfasalazine Hives   Eliquis [Apixaban] Hives and Itching   Xarelto [Rivaroxaban] Hives and Itching    Antimicrobials this admission: 5/15 Vancomycin >>  Dose adjustments this admission:  Microbiology results: None  Thank you for allowing pharmacy to be a part of this patient's care.  Loralee Pacas, PharmD, BCPS Please see amion for complete clinical pharmacist phone list 02/05/2023 2:40 PM

## 2023-02-05 NOTE — Anesthesia Procedure Notes (Signed)
Procedure Name: Intubation Date/Time: 02/05/2023 7:39 AM  Performed by: De Nurse, CRNAPre-anesthesia Checklist: Patient identified, Emergency Drugs available, Suction available and Patient being monitored Patient Re-evaluated:Patient Re-evaluated prior to induction Oxygen Delivery Method: Circle System Utilized Preoxygenation: Pre-oxygenation with 100% oxygen Induction Type: IV induction Ventilation: Mask ventilation without difficulty Laryngoscope Size: Mac and 3 Grade View: Grade II Tube type: Oral Tube size: 7.0 mm Number of attempts: 1 Airway Equipment and Method: Stylet and Oral airway Placement Confirmation: ETT inserted through vocal cords under direct vision, positive ETCO2 and breath sounds checked- equal and bilateral Secured at: 21 cm Tube secured with: Tape Dental Injury: Teeth and Oropharynx as per pre-operative assessment

## 2023-02-05 NOTE — H&P (Signed)
Subjective: Patient is a 77 y.o. female admitted for R frontal meningioma. Onset of symptoms was several weeks ago, unchanged since that time.  The pain is rated mild, and is located at the R frontal region. The pain is described as aching and occurs intermittently. Symptoms are exacerbated by nothing in particular. MRI or CT showed R frontal meningioma found after a fall   Past Medical History:  Diagnosis Date   Anemia    years ago after surgery   Asthma 1976   Atrial fibrillation (HCC)    Bronchitis    Dysrhythmia    A-fib   FUO (fever of unknown origin) 03/03/2015   GERD (gastroesophageal reflux disease)    Hepatitis 1980s   Hepatitis    non A- non B   HTN (hypertension)    Hypercholesterolemia    Night sweat 03/03/2015   PMR (polymyalgia rheumatica) (HCC) 03/15/2015   Polyarthritis 03/03/2015   Polymyalgia (HCC) 03/03/2015   Polymyositis (HCC) 03/03/2015   Sleep apnea     Past Surgical History:  Procedure Laterality Date   ABDOMINAL HYSTERECTOMY     APPENDECTOMY  09/24/1971   CHOLECYSTECTOMY N/A 10/08/2022   Procedure: LAPAROSCOPIC CHOLECYSTECTOMY;  Surgeon: Manus Rudd, MD;  Location: [facility] OR;  Service: General;  Laterality: N/A;   DG THUMB RIGHT HAND ([facility] HX)     DRUG INDUCED ENDOSCOPY N/A 11/14/2021   Procedure: DRUG INDUCED SLEEP ENDOSCOPY;  Surgeon: Christia Reading, MD;  Location: [facility];  Service: ENT;  Laterality: N/A;   FOOT SURGERY Left 09/24/2007   IMPLANTATION OF HYPOGLOSSAL NERVE STIMULATOR Right 12/18/2021   Procedure: IMPLANTATION OF HYPOGLOSSAL NERVE STIMULATOR;  Surgeon: Christia Reading, MD;  Location:  SURGERY CENTER;  Service: ENT;  Laterality: Right;   INTRAOPERATIVE CHOLANGIOGRAM N/A 10/08/2022   Procedure: INTRAOPERATIVE CHOLANGIOGRAM;  Surgeon: Manus Rudd, MD;  Location: [facility] OR;  Service: General;  Laterality: N/A;   KNEE ARTHROSCOPY  09/23/2005   right   OVARY SURGERY  09/24/1999   TUBAL LIGATION  09/23/1970    VAGINAL HYSTERECTOMY  09/24/1971    Prior to Admission medications   Medication Sig Start Date End Date Taking? Authorizing Provider  b complex vitamins capsule Take 1 capsule by mouth daily.   Yes [provider]  chlorthalidone (HYGROTON) 25 MG tablet Take 1 tablet (25 mg total) by mouth daily. 03/06/16  Yes [place], Peter M, MD  diphenhydrAMINE (BENADRYL) 25 MG tablet Take 25 mg by mouth See admin instructions. Every other night for sleep   Yes [provider]  doxylamine, Sleep, (UNISOM) 25 MG tablet Take 25 mg by mouth See admin instructions. Every other night for sleep   Yes [provider]  estradiol (VIVELLE-DOT) 0.05 MG/24HR patch Place 1 patch onto the skin 2 (two) times a week. 08/29/19  Yes [provider]  ezetimibe (ZETIA) 10 MG tablet TAKE 1 TABLET BY MOUTH EVERY DAY 07/24/22  Yes [place], Peter M, MD  fluticasone [facility]) 50 MCG/ACT nasal spray Place 2 sprays into both nostrils daily as needed for allergies. 08/28/19  Yes [provider]  irbesartan (AVAPRO) 300 MG tablet Take 300 mg by mouth daily.   Yes [provider]  Magnesium Oxide (MAG-OXIDE PO) Take 200 mg by mouth at bedtime.   Yes [provider]  montelukast (SINGULAIR) 10 MG tablet Take 10 mg by mouth daily. 10/17/19  Yes [provider]  Multiple Vitamin (MULTI-VITAMINS) TABS Take 1 tablet by mouth daily.    Yes [provider]  Multiple Vitamins-Minerals (LUTEIN-ZEAXANTHIN PO) Take 1 tablet by mouth daily.   Yes [provider]  omeprazole (PRILOSEC) 20 MG capsule Take 20 mg by mouth daily as needed.   Yes [provider]  OVER THE COUNTER MEDICATION Take 2 tablets by mouth at bedtime. Restful monk sleep supplement (without melatonin)   Yes [provider]  predniSONE (DELTASONE) 5 MG tablet Take 5 mg by mouth daily with breakfast.   Yes [provider]  sotalol (BETAPACE) 80 MG tablet Take 80 mg by mouth 2  (two) times daily.   Yes [provider]  traZODone (DESYREL) 50 MG tablet Take 50 mg by mouth at bedtime. 01/10/23  Yes [provider]  venlafaxine XR (EFFEXOR-XR) 150 MG 24 hr capsule Take 150 mg by mouth daily.   Yes [provider]  Vitamin D, Ergocalciferol, (DRISDOL) 1.25 MG (50000 UNIT) CAPS capsule Take 50,000 Units by mouth once a week. Monday 10/21/22  Yes [provider]  albuterol (PROAIR HFA) 108 (90 Base) MCG/ACT inhaler Inhale 2 puffs into the lungs every 6 (six) hours as needed. Patient taking differently: Inhale 2 puffs into the lungs every 6 (six) hours as needed for wheezing or shortness of breath. 09/14/18   Oretha Milch, MD   Allergies  Allergen Reactions   Atorvastatin Other (See Comments)    Muscle cramps severe      Simvastatin Other (See Comments)    Severe muscle cramps   Cephalosporins     'ran a fever"   Nickel Rash   Sulfa Antibiotics Hives   Sulfasalazine Hives   Eliquis [Apixaban] Hives and Itching   Xarelto [Rivaroxaban] Hives and Itching    Social History   Tobacco Use   Smoking status: Former    Packs/day: 0.20    Years: 5.00    Additional pack years: 0.00    Total pack years: 1.00    Types: Cigarettes    Quit date: 09/23/1980    Years since quitting: 42.3   Smokeless tobacco: Never  Substance Use Topics   Alcohol use: Yes    Comment: 1 glass of wine a day    Family History  Problem Relation Age of Onset   Hypertension Sister    Lung cancer Father    Hypertension Mother    Multiple sclerosis Daughter    Breast cancer Other        maternal aunt   Allergies Daughter    Allergies Daughter    Allergies Son      Review of Systems  Positive ROS: neg  All other systems have been reviewed and were otherwise negative with the exception of those mentioned in the HPI and as above.  Objective: Vital signs in last 24 hours: Temp:  [97.8 F (36.6 C)] 97.8 F (36.6 C) (05/15 0700) Pulse Rate:  [60]  60 (05/15 0700) Resp:  [18] 18 (05/15 0700) BP: (140)/(69) 140/69 (05/15 0700) SpO2:  [97 %] 97 % (05/15 0700) Weight:  [86.2 kg] 86.2 kg (05/15 0700)  General Appearance: Alert, cooperative, no distress, appears stated age Head: Normocephalic, without obvious abnormality, atraumatic Eyes: PERRL, conjunctiva/corneas clear, EOM's intact    Neck: Supple, symmetrical, trachea midline Back: Symmetric, no curvature, ROM normal, no CVA tenderness Lungs:  respirations unlabored Heart: Regular rate and rhythm Abdomen: Soft, non-tender Extremities: Extremities normal, atraumatic, no cyanosis or edema Pulses: 2+ and symmetric all extremities Skin: Skin color, texture, turgor normal, no rashes or lesions  NEUROLOGIC:   Mental status: Alert  and oriented x4,  no aphasia, good attention span, fund of knowledge, and memory Motor Exam - grossly normal Sensory Exam - grossly normal Reflexes: 1= Coordination - grossly normal Gait - grossly normal Balance - grossly normal Cranial Nerves: I: smell Not tested  II: visual acuity  OS: nl    OD: nl  II: visual fields Full to confrontation  II: pupils Equal, round, reactive to light  III,VII: ptosis None  III,IV,VI: extraocular muscles  Full ROM  V: mastication Normal  V: facial light touch sensation  Normal  V,VII: corneal reflex  Present  VII: facial muscle function - upper  Normal  VII: facial muscle function - lower Normal  VIII: hearing Not tested  IX: soft palate elevation  Normal  IX,X: gag reflex Present  XI: trapezius strength  5/5  XI: sternocleidomastoid strength 5/5  XI: neck flexion strength  5/5  XII: tongue strength  Normal    Data Review Lab Results  Component Value Date   WBC 11.2 (H) 01/30/2023   HGB 13.1 01/30/2023   HCT 38.9 01/30/2023   MCV 97.7 01/30/2023   PLT 181 01/30/2023   Lab Results  Component Value Date   NA 137 01/30/2023   K 3.3 (L) 01/30/2023   CL 98 01/30/2023   CO2 27 01/30/2023   BUN 26 (H)  01/30/2023   CREATININE 1.11 (H) 01/30/2023   GLUCOSE 113 (H) 01/30/2023   Lab Results  Component Value Date   INR 1.0 01/30/2023    Assessment/Plan:  Estimated body mass index is 32.61 kg/m as calculated from the following:   Height as of this encounter: 5\' 4"  (1.626 m).   Weight as of this encounter: 86.2 kg. Patient admitted for R frontal craniotomy for meningioma. Patient has failed a reasonable attempt at conservative therapy.  I explained the condition and procedure to the patient and answered any questions.  Patient wishes to proceed with procedure as planned. Understands risks/ benefits and typical outcomes of procedure.   Tia Alert 02/05/2023 7:53 AM

## 2023-02-05 NOTE — Anesthesia Postprocedure Evaluation (Signed)
Anesthesia Post Note  Patient: [name] [name]  Procedure(s) Performed: Craniotomy - right for meningioma - Frontal (Right) APPLICATION OF CRANIAL NAVIGATION (Right)     Patient location during evaluation: PACU Anesthesia Type: General Level of consciousness: awake and alert Pain management: pain level controlled Vital Signs Assessment: post-procedure vital signs reviewed and stable Respiratory status: spontaneous breathing, nonlabored ventilation, respiratory function stable and patient connected to nasal cannula oxygen Cardiovascular status: blood pressure returned to baseline and stable Postop Assessment: no apparent nausea or vomiting Anesthetic complications: no  There were no known notable events for this encounter.  Last Vitals:  Vitals:   02/05/23 1345 02/05/23 1500  BP: (!) 126/57 (!) 140/56  Pulse: (!) 59 (!) 57  Resp: 13 16  Temp: 36.8 C   SpO2: 92% 95%    Last Pain:  Vitals:   02/05/23 1500  TempSrc:   PainSc: 6                  Shelton Silvas

## 2023-02-06 ENCOUNTER — Inpatient Hospital Stay (HOSPITAL_COMMUNITY): Payer: Medicare HMO

## 2023-02-06 LAB — SURGICAL PATHOLOGY

## 2023-02-06 MED ORDER — CHLORHEXIDINE GLUCONATE CLOTH 2 % EX PADS
6.0000 | MEDICATED_PAD | Freq: Every day | CUTANEOUS | Status: DC
  Administered 2023-02-06: 6 via TOPICAL

## 2023-02-06 MED ORDER — GADOBUTROL 1 MMOL/ML IV SOLN
8.0000 mL | Freq: Once | INTRAVENOUS | Status: AC | PRN
  Administered 2023-02-06: 8 mL via INTRAVENOUS

## 2023-02-06 MED ORDER — VANCOMYCIN HCL 750 MG/150ML IV SOLN
750.0000 mg | Freq: Once | INTRAVENOUS | Status: AC
  Administered 2023-02-06: 750 mg via INTRAVENOUS
  Filled 2023-02-06: qty 150

## 2023-02-06 MED ORDER — LEVETIRACETAM 250 MG PO TABS
250.0000 mg | ORAL_TABLET | Freq: Two times a day (BID) | ORAL | Status: DC
  Administered 2023-02-06 – 2023-02-07 (×3): 250 mg via ORAL
  Filled 2023-02-06 (×4): qty 1

## 2023-02-06 MED ORDER — SODIUM CHLORIDE 0.9 % IV SOLN: INTRAVENOUS | Status: DC | PRN

## 2023-02-06 MED ORDER — PANTOPRAZOLE SODIUM 40 MG PO TBEC
40.0000 mg | DELAYED_RELEASE_TABLET | Freq: Every day | ORAL | Status: DC
  Administered 2023-02-06: 40 mg via ORAL
  Filled 2023-02-06: qty 1

## 2023-02-06 NOTE — Evaluation (Signed)
Physical Therapy Evaluation Patient Details Name: [name] [name] MRN: [id number] DOB: January 11, 1946 Today's Date: 02/06/2023  History of Present Illness  77 yo female s/p R frontal craniotomy for resection of R frontal meningioma. PMH includes afib, HTN, polymyalgia, lap chole 09/2022, R hypoglossal nerve stimulator 11/2021.  Clinical Impression   Pt presents with generalized weakness, impaired balance, and decreased activity tolerance vs baseline. Pt to benefit from acute PT to address deficits. Pt ambulated good hallway distance, attempted gait without AD but reaches for environment so currently benefits from RW. Pt has supportive husband and daughter present during session, anticipate pt will recover well acutely and post-acutely. No focal deficits appreciated at this time. PT to progress mobility as tolerated, and will continue to follow acutely.         Recommendations for follow up therapy are one component of a multi-disciplinary discharge planning process, led by the attending physician.  Recommendations may be updated based on patient status, additional functional criteria and insurance authorization.  Follow Up Recommendations       Assistance Recommended at Discharge Intermittent Supervision/Assistance  Patient can return home with the following  A little help with walking and/or transfers;A little help with bathing/dressing/bathroom    Equipment Recommendations None recommended by PT  Recommendations for Other Services       Functional Status Assessment Patient has had a recent decline in their functional status and demonstrates the ability to make significant improvements in function in a reasonable and predictable amount of time.     Precautions / Restrictions Precautions Precautions: Fall Restrictions Weight Bearing Restrictions: No      Mobility  Bed Mobility Overal bed mobility: Needs Assistance Bed Mobility: Supine to Sit     Supine to sit: Min assist, HOB  elevated     General bed mobility comments: assist for trunk elevation, steadying once sitting EOB.    Transfers Overall transfer level: Needs assistance Equipment used: None Transfers: Sit to/from Stand Sit to Stand: Min assist, From elevated surface           General transfer comment: assist to steady, pt reaching for environment to help with rise. Stand x2, from EOB and toilet.    Ambulation/Gait Ambulation/Gait assistance: Min guard Gait Distance (Feet): 200 Feet Assistive device: Rolling walker (2 wheels) Gait Pattern/deviations: Step-through pattern, Decreased stride length, Drifts right/left Gait velocity: decr     General Gait Details: reaches for environment without AD, PT provided RW with improved steadiness and gait. vss  Stairs            Wheelchair Mobility    Modified Rankin (Stroke Patients Only)       Balance Overall balance assessment: Needs assistance Sitting-balance support: No upper extremity supported, Feet supported Sitting balance-Leahy Scale: Fair     Standing balance support: Bilateral upper extremity supported, During functional activity, Reliant on assistive device for balance Standing balance-Leahy Scale: Poor                               Pertinent Vitals/Pain Pain Assessment Pain Assessment: Faces Faces Pain Scale: Hurts a little bit Pain Location: head Pain Descriptors / Indicators: Discomfort Pain Intervention(s): Limited activity within patient's tolerance, Monitored during session, Repositioned    Home Living Family/patient expects to be discharged to:: Private residence Living Arrangements: Spouse/significant other Available Help at Discharge: Family Type of Home: House Home Access: Stairs to enter   [employer] of Steps: 3 Alternate Level  Stairs-Number of Steps: flight, but has a chair lift that she can use if needed (there for her late mother) Home Layout: Two level Home Equipment:  Rollator (4 wheels);BSC/3in1;Tub bench      Prior Function Prior Level of Function : Independent/Modified Independent                     Hand Dominance   Dominant Hand: Right    Extremity/Trunk Assessment   Upper Extremity Assessment Upper Extremity Assessment: Defer to OT evaluation    Lower Extremity Assessment Lower Extremity Assessment: Generalized weakness    Cervical / Trunk Assessment Cervical / Trunk Assessment: Normal  Communication   Communication: No difficulties  Cognition Arousal/Alertness: Awake/alert Behavior During Therapy: WFL for tasks assessed/performed Overall Cognitive Status: Within Functional Limits for tasks assessed                                          General Comments General comments (skin integrity, edema, etc.): vss, drain to R side of head intact pre- and post-session    Exercises     Assessment/Plan    PT Assessment Patient needs continued PT services  PT Problem List Decreased mobility;Decreased activity tolerance;Decreased balance;Decreased knowledge of use of DME;Pain;Cardiopulmonary status limiting activity;Decreased safety awareness       PT Treatment Interventions DME instruction;Therapeutic activities;Gait training;Therapeutic exercise;Patient/family education;Balance training;Stair training;Functional mobility training;Neuromuscular re-education    PT Goals (Current goals can be found in the Care Plan section)  Acute Rehab PT Goals PT Goal Formulation: With patient Time For Goal Achievement: 02/20/23 Potential to Achieve Goals: Good    Frequency Min 4X/week     Co-evaluation               AM-PAC PT "6 Clicks" Mobility  Outcome Measure Help needed turning from your back to your side while in a flat bed without using bedrails?: A Little Help needed moving from lying on your back to sitting on the side of a flat bed without using bedrails?: A Little Help needed moving to and from a bed  to a chair (including a wheelchair)?: A Little Help needed standing up from a chair using your arms (e.g., wheelchair or bedside chair)?: A Little Help needed to walk in hospital room?: A Little Help needed climbing 3-5 steps with a railing? : A Little 6 Click Score: 18    End of Session   Activity Tolerance: Patient tolerated treatment well Patient left: in chair;with call bell/phone within reach;with chair alarm set;with nursing/sitter in room;with family/visitor present Nurse Communication: Mobility status PT Visit Diagnosis: Other abnormalities of gait and mobility (R26.89);Muscle weakness (generalized) (M62.81)    Time: 1610-9604 PT Time Calculation (min) (ACUTE ONLY): 26 min   Charges:   PT Evaluation $PT Eval Low Complexity: 1 Low PT Treatments $Therapeutic Activity: 8-22 mins        Renad Jenniges S, PT DPT Acute Rehabilitation Services Secure Chat Preferred  Office [phone]   Leydy Worthey E Stroup 02/06/2023, 11:59 AM

## 2023-02-06 NOTE — Progress Notes (Signed)
Patient ID: [name] [name], female   DOB: [birth year], 77 y.o.   MRN: [id number] Subjective: Patient reports only mild occasional incisional soreness, no NTW, no visual changes  Objective: Vital signs in last 24 hours: Temp:  [97.3 F (36.3 C)-98.2 F (36.8 C)] 97.3 F (36.3 C) (05/16 0400) Pulse Rate:  [57-77] 73 (05/16 0700) Resp:  [11-22] 22 (05/16 0700) BP: (114-144)/(49-68) 138/56 (05/16 0700) SpO2:  [91 %-98 %] 96 % (05/16 0700) Arterial Line BP: (132-166)/(47-59) 149/51 (05/16 0700)  Intake/Output from previous day: 05/15 0701 - 05/16 0700 In: 3118 [P.O.:840; I.V.:2077.9; IV Piggyback:200.1] Out: 1528 [Urine:1140; Drains:238; Blood:150] Intake/Output this shift: No intake/output data recorded.  Neurologic: Grossly normal  Lab Results: Lab Results  Component Value Date   WBC 11.2 (H) 01/30/2023   HGB 13.1 01/30/2023   HCT 38.9 01/30/2023   MCV 97.7 01/30/2023   PLT 181 01/30/2023   Lab Results  Component Value Date   INR 1.0 01/30/2023   BMET Lab Results  Component Value Date   NA 137 01/30/2023   K 3.3 (L) 01/30/2023   CL 98 01/30/2023   CO2 27 01/30/2023   GLUCOSE 113 (H) 01/30/2023   BUN 26 (H) 01/30/2023   CREATININE 1.11 (H) 01/30/2023   CALCIUM 9.5 01/30/2023    Studies/Results: No results found.  Assessment/Plan: Doing well, mobilize today with PT/OT, MRI today  Estimated body mass index is 32.61 kg/m as calculated from the following:   Height as of this encounter: 5\' 4"  (1.626 m).   Weight as of this encounter: 86.2 kg.    LOS: 1 day    Tia Alert 02/06/2023, 7:51 AM

## 2023-02-06 NOTE — TOC Initial Note (Signed)
Transition of Care [facility]) - Initial/Assessment Note    Patient Details  Name: FILOMENA ADAMEC MRN: [id number] Date of Birth: 25-May-1946  [facility] CM/SW Contact:    Mearl Latin, LCSW Phone Number: 02/06/2023, 3:36 PM  Clinical Narrative:                 TOC has reviewed patient and no TOC needs have been identified at this time. If new patient transition needs arise, please place [facility] consult.     Expected Discharge Plan: Home/Self Care Barriers to Discharge: Continued Medical Work up   Patient Goals and CMS Choice            Expected Discharge Plan and Services       Living arrangements for the past 2 months: Single Family Home                                      Prior Living Arrangements/Services Living arrangements for the past 2 months: Single Family Home Lives with:: Spouse Patient language and need for interpreter reviewed:: Yes              Criminal Activity/Legal Involvement Pertinent to Current Situation/Hospitalization: No - Comment as needed  Activities of Daily Living      Permission Sought/Granted                  Emotional Assessment       Orientation: : Oriented to Self, Oriented to Place, Oriented to  Time, Oriented to Situation Alcohol / Substance Use: Not Applicable Psych Involvement: No (comment)  Admission diagnosis:  S/P craniotomy [Z98.890] Patient Active Problem List   Diagnosis Date Noted   S/P craniotomy 02/05/2023   Irritable bowel syndrome 06/08/2019   Menopausal syndrome 06/08/2019   OSA (obstructive sleep apnea) 06/10/2017   PMR (polymyalgia rheumatica) (HCC) 03/15/2015   FUO (fever of unknown origin) 03/03/2015   Polymyositis (HCC) 03/03/2015   Polyarthritis 03/03/2015   Polymyalgia (HCC) 03/03/2015   Night sweat 03/03/2015   Hepatitis    HTN (hypertension) 05/27/2013   Impaired fasting glucose 01/01/2013   Abnormal finding on thyroid function test 05/20/2012   Allergic rhinitis  12/30/2011   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 [place] [address] Phone: [phone] Fax: [phone]     Social Determinants of Health (SDOH) Social History: SDOH Screenings   Tobacco Use: Medium Risk (02/05/2023)   SDOH Interventions:     Readmission Risk Interventions     No data to display

## 2023-02-06 NOTE — Progress Notes (Signed)
RN discussed with MRI about when patient can come down. RN was told that due to hypoglossal nerve stimulator, MRI will have to be done on day shift.

## 2023-02-06 NOTE — Progress Notes (Signed)
OT Cancellation Note  Patient Details Name: NEVE BURCHER MRN: [id number] DOB: 05/05/1946   Cancelled Treatment:    Reason Eval/Treat Not Completed: Other (comment) (Pt had Ativan prior to MRI. will follow up in the am)  [facility] 02/06/2023, 4:39 PM Luisa Dago, OT/L   Acute OT Clinical Specialist Acute Rehabilitation Services Pager [phone] Office [phone]

## 2023-02-07 LAB — BASIC METABOLIC PANEL
Anion gap: 12 (ref 5–15)
BUN: 22 mg/dL (ref 8–23)
CO2: 20 mmol/L — ABNORMAL LOW (ref 22–32)
Calcium: 8.2 mg/dL — ABNORMAL LOW (ref 8.9–10.3)
Chloride: 102 mmol/L (ref 98–111)
Creatinine, Ser: 1.06 mg/dL — ABNORMAL HIGH (ref 0.44–1.00)
GFR, Estimated: 54 mL/min — ABNORMAL LOW (ref >60)
Glucose, Bld: 192 mg/dL — ABNORMAL HIGH (ref 70–99)
Potassium: 3.7 mmol/L (ref 3.5–5.1)
Sodium: 134 mmol/L — ABNORMAL LOW (ref 135–145)

## 2023-02-07 LAB — MAGNESIUM: Magnesium: 2.2 mg/dL (ref 1.7–2.4)

## 2023-02-07 LAB — GLUCOSE, CAPILLARY: Glucose-Capillary: 157 mg/dL — ABNORMAL HIGH (ref 70–99)

## 2023-02-07 MED ORDER — LEVETIRACETAM 250 MG PO TABS: 250.0000 mg | ORAL_TABLET | Freq: Two times a day (BID) | ORAL | 0 refills | Status: DC

## 2023-02-07 MED ORDER — POTASSIUM CHLORIDE CRYS ER 20 MEQ PO TBCR
40.0000 meq | EXTENDED_RELEASE_TABLET | Freq: Once | ORAL | Status: AC
  Administered 2023-02-07: 40 meq via ORAL
  Filled 2023-02-07: qty 2

## 2023-02-07 MED ORDER — METHYLPREDNISOLONE 4 MG PO TBPK: ORAL_TABLET | ORAL | 0 refills | Status: DC

## 2023-02-07 MED ORDER — INSULIN ASPART 100 UNIT/ML IJ SOLN
0.0000 [IU] | Freq: Four times a day (QID) | INTRAMUSCULAR | Status: DC
  Administered 2023-02-07: 2 [IU] via SUBCUTANEOUS

## 2023-02-07 MED ORDER — HYDROCODONE-ACETAMINOPHEN 5-325 MG PO TABS: 1.0000 | ORAL_TABLET | Freq: Four times a day (QID) | ORAL | 0 refills | Status: DC | PRN

## 2023-02-07 NOTE — Progress Notes (Signed)
Neurosurgery PA [facility] notified that patient's drain appears to out of place, although still in the scalp. PA notified that RN noticed some new blood on pillow behind patient and on drain dressing, and that drain did not appear to be holding a charge. RN attempted to recharge drain, which then quickly filled with air. Patient alert and neuro status unchanged. Patient oriented x4, follows commands, moves all extremities, no numbness/tingling on extremities. Pupils equal round and reactive. PA stated to clamp drain. No other orders. RN clamped drain.

## 2023-02-07 NOTE — Evaluation (Signed)
Occupational Therapy Evaluation Patient Details Name: [name] [name] MRN: [id number] DOB: [birth year] Today's Date: 02/07/2023   History of Present Illness 77 yo female s/p R frontal craniotomy for resection of R frontal meningioma. PMH includes afib, HTN, polymyalgia, lap chole 09/2022, R hypoglossal nerve stimulator 11/2021.   Clinical Impression   Prior to this admission, patient independent, driving, and walks without a walker or a cane at baseline. Currently, patient presenting with need for RW for longer distances, minimal increased need for assist to complete ADLs, and bumping into objects on L hand side, though visual field is intact. Patient min guard for ADLs and transfers, and able to complete upper level dual tasks in hallway without issue. Advised patient not to drive initially, however will progress back to prior level of independence with time. OT will continue to follow acutely, though patient will not need OT follow up past acute stay.      Recommendations for follow up therapy are one component of a multi-disciplinary discharge planning process, led by the attending physician.  Recommendations may be updated based on patient status, additional functional criteria and insurance authorization.   Assistance Recommended at Discharge Frequent or constant Supervision/Assistance (initially)  Patient can return home with the following A little help with walking and/or transfers;A little help with bathing/dressing/bathroom;Assistance with cooking/housework;Direct supervision/assist for medications management;Direct supervision/assist for financial management;Assist for transportation;Help with stairs or ramp for entrance (only initially)    Functional Status Assessment  Patient has had a recent decline in their functional status and demonstrates the ability to make significant improvements in function in a reasonable and predictable amount of time.  Equipment Recommendations  None  recommended by OT    Recommendations for Other Services       Precautions / Restrictions Precautions Precautions: Fall Restrictions Weight Bearing Restrictions: No      Mobility Bed Mobility               General bed mobility comments: up in chair upon arrival    Transfers Overall transfer level: Needs assistance Equipment used: None, Rolling walker (2 wheels) Transfers: Sit to/from Stand Sit to Stand: Min guard           General transfer comment: min gaurd to come into standing from recliner and toilet, asking for RW to complete lap around the unit, slowed pace, but able to complete dual tasks without issue      Balance Overall balance assessment: Needs assistance Sitting-balance support: No upper extremity supported, Feet supported Sitting balance-Leahy Scale: Fair     Standing balance support: Bilateral upper extremity supported, During functional activity, Reliant on assistive device for balance Standing balance-Leahy Scale: Poor Standing balance comment: reliant on RW for longer distances                           ADL either performed or assessed with clinical judgement   ADL Overall ADL's : Needs assistance/impaired Eating/Feeding: Set up;Sitting   Grooming: Set up;Standing   Upper Body Bathing: Min guard;Sitting   Lower Body Bathing: Min guard;Sitting/lateral leans;Sit to/from stand   Upper Body Dressing : Min guard;Sitting   Lower Body Dressing: Min guard;Sit to/from stand;Sitting/lateral leans   Toilet Transfer: Min guard;Ambulation;Regular Toilet;Grab bars   Toileting- Clothing Manipulation and Hygiene: Min guard;Sit to/from stand;Sitting/lateral lean       Functional mobility during ADLs: Min guard;Rolling walker (2 wheels) General ADL Comments: Patient presenting with need for RW for longer distances, minimal increased  need for assist to complete ADLs, and bumping into objects on L hand side, though visual field is intact.      Vision Baseline Vision/History: 1 Wears glasses (Reading) Ability to See in Adequate Light: 0 Adequate Patient Visual Report: No change from baseline Vision Assessment?: Yes Eye Alignment: Within Functional Limits Ocular Range of Motion: Within Functional Limits Alignment/Gaze Preference: Within Defined Limits Tracking/Visual Pursuits: Able to track stimulus in all quads without difficulty Saccades: Within functional limits Convergence: Within functional limits Visual Fields: No apparent deficits Additional Comments: Bumping into objects on L side, visual field tested with no deficits noted     Perception     Praxis Praxis Praxis tested?: Within functional limits    Pertinent Vitals/Pain Pain Assessment Pain Assessment: No/denies pain     Hand Dominance Right   Extremity/Trunk Assessment Upper Extremity Assessment Upper Extremity Assessment: Overall WFL for tasks assessed   Lower Extremity Assessment Lower Extremity Assessment: Defer to PT evaluation   Cervical / Trunk Assessment Cervical / Trunk Assessment: Normal   Communication Communication Communication: No difficulties   Cognition Arousal/Alertness: Awake/alert Behavior During Therapy: WFL for tasks assessed/performed Overall Cognitive Status: Within Functional Limits for tasks assessed                                 General Comments: minimal STM deficits, likely due to anesthesia     General Comments  vss, drain to R side of head intact pre- and post-session    Exercises     Shoulder Instructions      Home Living Family/patient expects to be discharged to:: Private residence Living Arrangements: Spouse/significant other Available Help at Discharge: Family Type of Home: House Home Access: Stairs to enter Secretary/administrator of Steps: 3   Home Layout: Two level Alternate Level Stairs-Number of Steps: flight, but has a chair lift that she can use if needed (there for her late  mother)   Bathroom Shower/Tub: Walk-in shower;Tub/shower unit   [employer]: Standard     Home Equipment: Rollator (4 wheels);BSC/3in1;Tub bench          Prior Functioning/Environment Prior Level of Function : Independent/Modified Independent             Mobility Comments: independent ADLs Comments: independent, driving        OT Problem List: Decreased activity tolerance;Impaired balance (sitting and/or standing);Decreased safety awareness      OT Treatment/Interventions: Self-care/ADL training;Therapeutic exercise;Energy conservation;DME and/or AE instruction;Therapeutic activities;Balance training;Patient/family education    OT Goals(Current goals can be found in the care plan section) Acute Rehab OT Goals Patient Stated Goal: to get out of here OT Goal Formulation: With patient/family Time For Goal Achievement: 02/21/23 Potential to Achieve Goals: Good  OT Frequency: Min 2X/week    Co-evaluation              AM-PAC OT "6 Clicks" Daily Activity     Outcome Measure Help from another person eating meals?: None Help from another person taking care of personal grooming?: None Help from another person toileting, which includes using toliet, bedpan, or urinal?: A Little Help from another person bathing (including washing, rinsing, drying)?: A Little Help from another person to put on and taking off regular upper body clothing?: A Little Help from another person to put on and taking off regular lower body clothing?: A Little 6 Click Score: 20   End of Session Equipment Utilized During Treatment: Gait belt;Rolling  walker (2 wheels) Nurse Communication: Mobility status  Activity Tolerance: Patient tolerated treatment well Patient left: in chair;with call bell/phone within reach;with family/visitor present  OT Visit Diagnosis: Unsteadiness on feet (R26.81);Other abnormalities of gait and mobility (R26.89)                Time: 5638-7564 OT Time Calculation  (min): 27 min Charges:  OT General Charges $OT Visit: 1 Visit OT Evaluation $OT Eval Moderate Complexity: 1 Mod OT Treatments $Self Care/Home Management : 8-22 mins  Pollyann Glen E. Laneya Gasaway, OTR/L Acute Rehabilitation Services [phone]   Cherlyn Cushing 02/07/2023, 11:24 AM

## 2023-02-07 NOTE — Discharge Instructions (Signed)
Patient education given on 02-07-2023 and the patient expresses understanding and acceptance of instructions. [name] [name] 02/07/2023 12:46 PM

## 2023-02-07 NOTE — Discharge Summary (Signed)
Physician Discharge Summary  Patient ID: JOHNELL RIGANO MRN: [id number] DOB/AGE: 79-Sep-1947 79 y.o.  Admit date: 02/05/2023 Discharge date: 02/07/2023  Admission Diagnoses: right frontal meningioma    Discharge Diagnoses: same   Discharged Condition: good  Hospital Course: The patient was admitted on 02/05/2023 and taken to the operating room where the patient underwent crani for resection of right frontal meningioma. The patient tolerated the procedure well and was taken to the recovery room and then to the ICU in stable condition. The hospital course was routine. There were no complications. The wound remained clean dry and intact. Pt had appropriate head soreness. No complaints of new pain or new N/T/W. The patient remained afebrile with stable vital signs, and tolerated a regular diet. The patient continued to increase activities, and pain was well controlled with oral pain medications.   Consults: None  Significant Diagnostic Studies:  Results for orders placed or performed during the hospital encounter of 02/05/23  Basic metabolic panel  Result Value Ref Range   Sodium 134 (L) 135 - 145 mmol/L   Potassium 3.7 3.5 - 5.1 mmol/L   Chloride 102 98 - 111 mmol/L   CO2 20 (L) 22 - 32 mmol/L   Glucose, Bld 192 (H) 70 - 99 mg/dL   BUN 22 8 - 23 mg/dL   Creatinine, Ser 4.09 (H) 0.44 - 1.00 mg/dL   Calcium 8.2 (L) 8.9 - 10.3 mg/dL   GFR, Estimated 54 (L) >60 mL/min   Anion gap 12 5 - 15  Magnesium  Result Value Ref Range   Magnesium 2.2 1.7 - 2.4 mg/dL  ABO/Rh  Result Value Ref Range   ABO/RH(D)      O POS Performed at [facility], [address], [place], [place] [address]   Surgical pathology  Result Value Ref Range   SURGICAL PATHOLOGY      SURGICAL PATHOLOGY CASE: [id number] PATIENT: Raniyah Skoczylas Surgical Pathology Report     Clinical History: meningioma (cm)     FINAL MICROSCOPIC DIAGNOSIS:  A. BRAIN TUMOR, RIGHT FRONTAL, RESECTION: -  Meningioma  (transitional type-meningothelial and fibrous components), WHO grade 1.  Note: There is no increase in mitotic activity, increased cellularity, atypia, necrosis or brain invasion (focal glial tissue present).  GROSS DESCRIPTION:  Received fresh is a 7.0 x 5.0 x 2.4 cm fragment of red-pink, spongy soft tissue that is received moderately disrupted.  Sectioning reveals underlying pink-tan, diffusely hemorrhagic cut surfaces.  Representative sections are submitted in 2 blocks. (KW, 02/05/2023)    Final Diagnosis performed by Orene Desanctis DO.   Electronically signed 02/06/2023 Technical component performed at [employer]. [facility], [address], [place], [place] [address].  Professional component performed at [employer], [address], [place], [place] [address].  Immunohistochemistry Technical component (if applicable) was performed at [facility]. [address], [place], [place] [address].   IMMUNOHISTOCHEMISTRY DISCLAIMER (if applicable): Some of these immunohistochemical stains may have been developed and the performance characteristics determine by [facility]. Some may not have been cleared or approved by the U.S. Food and Drug Administration. The FDA has determined that such clearance or approval is not necessary. This test is used for clinical purposes. It should not be regarded as investigational or for research. This laboratory is certified under the Clinical Laboratory Improvement Amendments of 1988 (CLIA-88) as qualified to perform high complexity clinical laboratory testing.  The controls stained appropriately.     MR  BRAIN W WO CONTRAST  Result Date: 02/06/2023 CLINICAL DATA:  CNS neoplasm, monitor EXAM: MRI HEAD WITHOUT AND WITH CONTRAST TECHNIQUE: Multiplanar, multiecho pulse sequences of the brain and surrounding structures were obtained without and with intravenous contrast. CONTRAST:  8mL  GADAVIST GADOBUTROL 1 MMOL/ML IV SOLN COMPARISON:  01/21/2023 FINDINGS: Brain: Status post interval right frontal craniotomy and resection of a subjacent right frontal convexity meningioma. Heterogeneous fluid and air are noted in the resection cavity, not unexpected postoperatively. 6 mm right-to-left midline shift, similar to the prior exam. Edema surrounding the resection cavity appears similar to that surrounding the meningioma. Similar mass effect on the right frontal horn and genu of the corpus callosum. No definite enhancement in the parenchyma adjacent to the resection cavity. No restricted diffusion to suggest acute or subacute infarct. Small fluid collection subjacent to the craniotomy flap is not unexpected. Vascular: Normal arterial flow voids. Skull and upper cervical spine: Right frontal craniotomy. Otherwise normal marrow signal. Superficial skin staples. Sinuses/Orbits: Fluid in the right frontal sinus and anterior right ethmoid air cells. Redemonstrated chronic mucosal thickening and opacification of the maxillary sinuses. Status post bilateral lens replacements. Other: The mastoids are well aerated. IMPRESSION: 1. Status post interval right frontal craniotomy and resection of a subjacent right frontal convexity meningioma. No evidence of residual meningioma. 2. Similar 6 mm right-to-left midline shift and mass effect on the right frontal horn and genu of the corpus callosum. Electronically Signed   By: Wiliam Ke M.D.   On: 02/06/2023 19:30   MR BRAIN W WO CONTRAST  Result Date: 01/26/2023 CLINICAL DATA:  Meningioma EXAM: MRI HEAD WITHOUT AND WITH CONTRAST TECHNIQUE: Multiplanar, multiecho pulse sequences of the brain and surrounding structures were obtained without and with intravenous contrast. CONTRAST:  8mL GADAVIST GADOBUTROL 1 MMOL/ML IV SOLN COMPARISON:  01/05/2023 FINDINGS: Evaluation is limited due to conditional aspects of the patient's Inspire device. Brain: Redemonstrated  extra-axial mass along the right frontal convexity, which is avidly enhancing and measures up to 5.3 x 4.6 x 4.1 cm (AP x TR x CC) (series 5, image 91 and series 6, image 16). This causes significant mass effect on the right frontal lobe, with surrounding edema. There is mass effect on the right frontal horn and right genu of the corpus callosum. 6 mm right to left midline shift anteriorly. Vascular: Normal arterial and venous enhancement. Skull and upper cervical spine: Evidence of calvarial invasion (series 5, image 93) along the anterolateral aspect of the mass. Otherwise normal marrow signal. Sinuses/Orbits: Mucosal thickening and complete opacification of the maxillary sinuses. Additional mucosal thickening and opacification in the right greater than left sphenoid sinus. No acute finding in the orbits. Other: None. IMPRESSION: 1. Evaluation is limited due to conditional aspects of the patient's Inspire device. 2. Redemonstrated extra-axial mass along the right frontal convexity, which is avidly enhancing and measures up to 5.3 cm, with significant mass effect on the right frontal lobe, surrounding edema, and 6 mm right to left midline shift anteriorly. 3. Evidence of calvarial invasion along the anterolateral aspect of the mass. Electronically Signed   By: Wiliam Ke M.D.   On: 01/26/2023 02:51    Antibiotics:  Anti-infectives (From admission, onward)    Start     Dose/Rate Route Frequency Ordered Stop   02/06/23 0900  vancomycin (VANCOREADY) IVPB 750 mg/150 mL  Status:  Discontinued        750 mg 150 mL/hr over 60 Minutes Intravenous Every 24 hours 02/05/23 1444 02/06/23 0749  02/06/23 0845  vancomycin (VANCOREADY) IVPB 750 mg/150 mL        750 mg 150 mL/hr over 60 Minutes Intravenous  Once 02/06/23 0749 02/06/23 1015   02/05/23 0700  vancomycin (VANCOCIN) IVPB 1000 mg/200 mL premix        1,000 mg 200 mL/hr over 60 Minutes Intravenous On call to O.R. 02/05/23 [id number] 02/05/23 0946        Discharge Exam: Blood pressure (!) 142/56, pulse (!) 57, temperature 98 F (36.7 C), temperature source Axillary, resp. rate (!) 24, height 5\' 4"  (1.626 m), weight 86.2 kg, SpO2 94 %. Neurologic: Grossly normal Ambulating and voiding well incision cdi   Discharge Medications:   Allergies as of 02/07/2023       Reactions   Atorvastatin Other (See Comments)   Muscle cramps severe   Simvastatin Other (See Comments)   Severe muscle cramps   Cephalosporins    'ran a fever"   Nickel Rash   Sulfa Antibiotics Hives   Sulfasalazine Hives   Eliquis [apixaban] Hives, Itching   Xarelto [rivaroxaban] Hives, Itching        Medication List     TAKE these medications    albuterol 108 (90 Base) MCG/ACT inhaler Commonly known as: ProAir HFA Inhale 2 puffs into the lungs every 6 (six) hours as needed. What changed: reasons to take this   b complex vitamins capsule Take 1 capsule by mouth daily.   chlorthalidone 25 MG tablet Commonly known as: HYGROTON Take 1 tablet (25 mg total) by mouth daily.   diphenhydrAMINE 25 MG tablet Commonly known as: BENADRYL Take 25 mg by mouth See admin instructions. Every other night for sleep   doxylamine (Sleep) 25 MG tablet Commonly known as: UNISOM Take 25 mg by mouth See admin instructions. Every other night for sleep   estradiol 0.05 MG/24HR patch Commonly known as: VIVELLE-DOT Place 1 patch onto the skin 2 (two) times a week.   ezetimibe 10 MG tablet Commonly known as: ZETIA TAKE 1 TABLET BY MOUTH EVERY DAY   fluticasone 50 MCG/ACT nasal spray Commonly known as: FLONASE Place 2 sprays into both nostrils daily as needed for allergies.   HYDROcodone-acetaminophen 5-325 MG tablet Commonly known as: NORCO/VICODIN Take 1 tablet by mouth every 6 (six) hours as needed for moderate pain.   irbesartan 300 MG tablet Commonly known as: AVAPRO Take 300 mg by mouth daily.   levETIRAcetam 250 MG tablet Commonly known as: KEPPRA Take 1  tablet (250 mg total) by mouth 2 (two) times daily.   LUTEIN-ZEAXANTHIN PO Take 1 tablet by mouth daily.   MAG-OXIDE PO Take 200 mg by mouth at bedtime.   methylPREDNISolone 4 MG Tbpk tablet Commonly known as: MEDROL DOSEPAK Take as directed   montelukast 10 MG tablet Commonly known as: SINGULAIR Take 10 mg by mouth daily.   Multi-Vitamins Tabs Take 1 tablet by mouth daily.   omeprazole 20 MG capsule Commonly known as: PRILOSEC Take 20 mg by mouth daily as needed.   OVER THE COUNTER MEDICATION Take 2 tablets by mouth at bedtime. Restful monk sleep supplement (without melatonin)   predniSONE 5 MG tablet Commonly known as: DELTASONE Take 5 mg by mouth daily with breakfast.   sotalol 80 MG tablet Commonly known as: BETAPACE Take 80 mg by mouth 2 (two) times daily.   traZODone 50 MG tablet Commonly known as: DESYREL Take 50 mg by mouth at bedtime.   venlafaxine XR 150 MG 24 hr capsule Commonly known as:  EFFEXOR-XR Take 150 mg by mouth daily.   Vitamin D (Ergocalciferol) 1.25 MG (50000 UNIT) Caps capsule Commonly known as: DRISDOL Take 50,000 Units by mouth once a week. Monday        Disposition: home   Final Dx: craniotomy for resection of right frontal meningioma  Discharge Instructions     Call MD for:  difficulty breathing, headache or visual disturbances   Complete by: As directed    Call MD for:  persistant nausea and vomiting   Complete by: As directed    Call MD for:  redness, tenderness, or signs of infection (pain, swelling, redness, odor or green/yellow discharge around incision site)   Complete by: As directed    Call MD for:  severe uncontrolled pain   Complete by: As directed    Call MD for:  temperature >100.4   Complete by: As directed    Diet - low sodium heart healthy   Complete by: As directed    Discharge instructions   Complete by: As directed    No driving, no strenuous activity, may shower   Increase activity slowly   Complete  by: As directed         Follow-up Information     Tia Alert, MD. Schedule an appointment as soon as possible for a visit in 2 month(s).   Specialty: Neurosurgery Contact information: [address] [place] [address] [phone]                  Signed: Tiana Loft [facility] 02/07/2023, 11:57 AM

## 2023-02-10 ENCOUNTER — Encounter (HOSPITAL_COMMUNITY): Payer: Self-pay | Admitting: Neurological Surgery

## 2023-02-12 DIAGNOSIS — I480 Paroxysmal atrial fibrillation: Secondary | ICD-10-CM | POA: Diagnosis not present

## 2023-02-12 DIAGNOSIS — E119 Type 2 diabetes mellitus without complications: Secondary | ICD-10-CM | POA: Diagnosis not present

## 2023-02-12 DIAGNOSIS — M8580 Other specified disorders of bone density and structure, unspecified site: Secondary | ICD-10-CM | POA: Diagnosis not present

## 2023-02-12 DIAGNOSIS — G720 Drug-induced myopathy: Secondary | ICD-10-CM | POA: Diagnosis not present

## 2023-02-12 DIAGNOSIS — M47816 Spondylosis without myelopathy or radiculopathy, lumbar region: Secondary | ICD-10-CM | POA: Diagnosis not present

## 2023-02-12 DIAGNOSIS — I10 Essential (primary) hypertension: Secondary | ICD-10-CM | POA: Diagnosis not present

## 2023-02-12 DIAGNOSIS — E785 Hyperlipidemia, unspecified: Secondary | ICD-10-CM | POA: Diagnosis not present

## 2023-02-12 DIAGNOSIS — D329 Benign neoplasm of meninges, unspecified: Secondary | ICD-10-CM | POA: Diagnosis not present

## 2023-02-12 DIAGNOSIS — M353 Polymyalgia rheumatica: Secondary | ICD-10-CM | POA: Diagnosis not present

## 2023-02-12 DIAGNOSIS — R5383 Other fatigue: Secondary | ICD-10-CM | POA: Diagnosis not present

## 2023-02-12 DIAGNOSIS — F1010 Alcohol abuse, uncomplicated: Secondary | ICD-10-CM | POA: Diagnosis not present

## 2023-02-12 DIAGNOSIS — R059 Cough, unspecified: Secondary | ICD-10-CM | POA: Diagnosis not present

## 2023-03-25 DIAGNOSIS — Z7952 Long term (current) use of systemic steroids: Secondary | ICD-10-CM | POA: Diagnosis not present

## 2023-03-25 DIAGNOSIS — M1991 Primary osteoarthritis, unspecified site: Secondary | ICD-10-CM | POA: Diagnosis not present

## 2023-03-25 DIAGNOSIS — M353 Polymyalgia rheumatica: Secondary | ICD-10-CM | POA: Diagnosis not present

## 2023-03-25 DIAGNOSIS — M79644 Pain in right finger(s): Secondary | ICD-10-CM | POA: Diagnosis not present

## 2023-03-25 DIAGNOSIS — E669 Obesity, unspecified: Secondary | ICD-10-CM | POA: Diagnosis not present

## 2023-03-25 DIAGNOSIS — Z6833 Body mass index (BMI) 33.0-33.9, adult: Secondary | ICD-10-CM | POA: Diagnosis not present

## 2023-04-01 ENCOUNTER — Ambulatory Visit: Payer: Medicare HMO

## 2023-04-01 ENCOUNTER — Telehealth: Payer: Self-pay | Admitting: Pharmacist

## 2023-04-01 DIAGNOSIS — G8929 Other chronic pain: Secondary | ICD-10-CM | POA: Insufficient documentation

## 2023-04-01 DIAGNOSIS — D320 Benign neoplasm of cerebral meninges: Secondary | ICD-10-CM | POA: Insufficient documentation

## 2023-04-01 DIAGNOSIS — E669 Obesity, unspecified: Secondary | ICD-10-CM | POA: Insufficient documentation

## 2023-04-01 DIAGNOSIS — I480 Paroxysmal atrial fibrillation: Secondary | ICD-10-CM

## 2023-04-01 MED ORDER — WARFARIN SODIUM 5 MG PO TABS: ORAL_TABLET | ORAL | 0 refills | Status: DC

## 2023-04-01 NOTE — Telephone Encounter (Signed)
Patient scheduled for lipid appt this afternoon. During pre charting, noticed patient was on Repatha. Contacted patient who said she was currently taking Repatha and it was only an insurance issue which was why she had not been able to before. Patient said we were also to discuss restarting anticoagulation. Has been on warfarin, Eliquis, and Xarelto before. Has had allergic reaction to both Eliquis and Xarelto so is willing to restart coumadin. Will send in first Rx and scheduled for first New Coumadin appt on 7/15.

## 2023-04-03 DIAGNOSIS — G4733 Obstructive sleep apnea (adult) (pediatric): Secondary | ICD-10-CM | POA: Diagnosis not present

## 2023-04-03 DIAGNOSIS — M8580 Other specified disorders of bone density and structure, unspecified site: Secondary | ICD-10-CM | POA: Diagnosis not present

## 2023-04-03 DIAGNOSIS — M353 Polymyalgia rheumatica: Secondary | ICD-10-CM | POA: Diagnosis not present

## 2023-04-03 DIAGNOSIS — I480 Paroxysmal atrial fibrillation: Secondary | ICD-10-CM | POA: Diagnosis not present

## 2023-04-03 DIAGNOSIS — K8020 Calculus of gallbladder without cholecystitis without obstruction: Secondary | ICD-10-CM | POA: Diagnosis not present

## 2023-04-03 DIAGNOSIS — R059 Cough, unspecified: Secondary | ICD-10-CM | POA: Diagnosis not present

## 2023-04-03 DIAGNOSIS — F1010 Alcohol abuse, uncomplicated: Secondary | ICD-10-CM | POA: Diagnosis not present

## 2023-04-03 DIAGNOSIS — I10 Essential (primary) hypertension: Secondary | ICD-10-CM | POA: Diagnosis not present

## 2023-04-03 DIAGNOSIS — G720 Drug-induced myopathy: Secondary | ICD-10-CM | POA: Diagnosis not present

## 2023-04-03 DIAGNOSIS — E119 Type 2 diabetes mellitus without complications: Secondary | ICD-10-CM | POA: Diagnosis not present

## 2023-04-03 DIAGNOSIS — D329 Benign neoplasm of meninges, unspecified: Secondary | ICD-10-CM | POA: Diagnosis not present

## 2023-04-03 DIAGNOSIS — E785 Hyperlipidemia, unspecified: Secondary | ICD-10-CM | POA: Diagnosis not present

## 2023-04-07 ENCOUNTER — Ambulatory Visit: Payer: Medicare HMO | Attending: Cardiovascular Disease

## 2023-04-07 ENCOUNTER — Other Ambulatory Visit: Payer: Self-pay

## 2023-04-07 DIAGNOSIS — I480 Paroxysmal atrial fibrillation: Secondary | ICD-10-CM

## 2023-04-07 DIAGNOSIS — Z7901 Long term (current) use of anticoagulants: Secondary | ICD-10-CM

## 2023-04-07 DIAGNOSIS — I4891 Unspecified atrial fibrillation: Secondary | ICD-10-CM

## 2023-04-07 LAB — POCT INR: INR: 1.8 — AB (ref 2.0–3.0)

## 2023-04-07 NOTE — Patient Instructions (Signed)
TAKE 1.5 TABLETS TODAY ONLY THEN CONTINUE 1 TABLET DAILY.  INR in 1 week.    A full discussion of the nature of anticoagulants has been carried out.  A benefit risk analysis has been presented to the patient, so that they understand the justification for choosing anticoagulation at this time. The need for frequent and regular monitoring, precise dosage adjustment and compliance is stressed.  Side effects of potential bleeding are discussed.  The patient should avoid any OTC items containing aspirin or ibuprofen, and should avoid great swings in general diet.  Avoid alcohol consumption.  Call if any signs of abnormal bleeding.  [phone]

## 2023-04-14 ENCOUNTER — Ambulatory Visit: Payer: Medicare HMO | Attending: Cardiology

## 2023-04-14 DIAGNOSIS — I480 Paroxysmal atrial fibrillation: Secondary | ICD-10-CM | POA: Diagnosis not present

## 2023-04-14 DIAGNOSIS — Z7901 Long term (current) use of anticoagulants: Secondary | ICD-10-CM | POA: Diagnosis not present

## 2023-04-14 LAB — POCT INR: INR: 3.4 — AB (ref 2.0–3.0)

## 2023-04-14 NOTE — Patient Instructions (Signed)
TAKE 0.5 TABLET TODAY ONLY THEN CONTINUE 1 TABLET DAILY.  INR in 1 week.   [phone]

## 2023-04-21 ENCOUNTER — Ambulatory Visit: Payer: Medicare HMO | Attending: Cardiology

## 2023-04-21 DIAGNOSIS — Z7901 Long term (current) use of anticoagulants: Secondary | ICD-10-CM

## 2023-04-21 DIAGNOSIS — I480 Paroxysmal atrial fibrillation: Secondary | ICD-10-CM

## 2023-04-21 LAB — POCT INR: INR: 2.7 (ref 2.0–3.0)

## 2023-04-21 NOTE — Patient Instructions (Signed)
CONTINUE 1 TABLET DAILY. Morning Complete 3 times weekly.  INR in 1 week.   [phone]

## 2023-04-23 ENCOUNTER — Other Ambulatory Visit: Payer: Self-pay | Admitting: Cardiology

## 2023-04-23 DIAGNOSIS — M19011 Primary osteoarthritis, right shoulder: Secondary | ICD-10-CM | POA: Diagnosis not present

## 2023-04-23 DIAGNOSIS — I480 Paroxysmal atrial fibrillation: Secondary | ICD-10-CM

## 2023-04-23 NOTE — Telephone Encounter (Signed)
Warfarin 5mg  Afib Last INR 04/21/23 Last OV 02/03/23

## 2023-04-28 ENCOUNTER — Ambulatory Visit: Payer: Medicare HMO | Attending: Cardiovascular Disease | Admitting: *Deleted

## 2023-04-28 DIAGNOSIS — I480 Paroxysmal atrial fibrillation: Secondary | ICD-10-CM

## 2023-04-28 DIAGNOSIS — Z7901 Long term (current) use of anticoagulants: Secondary | ICD-10-CM | POA: Diagnosis not present

## 2023-04-28 LAB — POCT INR: INR: 3 (ref 2.0–3.0)

## 2023-04-28 NOTE — Patient Instructions (Signed)
Description   Start taking 1 tablet daily except 1/2 tablet on Mondays. Recheck INR in 1 week. Anticoagulation Clinic [phone]

## 2023-05-05 ENCOUNTER — Ambulatory Visit: Payer: Medicare HMO | Attending: Cardiology

## 2023-05-05 DIAGNOSIS — Z7901 Long term (current) use of anticoagulants: Secondary | ICD-10-CM | POA: Diagnosis not present

## 2023-05-05 DIAGNOSIS — I480 Paroxysmal atrial fibrillation: Secondary | ICD-10-CM

## 2023-05-05 LAB — POCT INR: INR: 2.3 (ref 2.0–3.0)

## 2023-05-05 NOTE — Patient Instructions (Signed)
Continue taking 1 tablet daily except 1/2 tablet on Mondays. Recheck INR in 3 weeks. Anticoagulation Clinic [phone]

## 2023-05-06 ENCOUNTER — Telehealth: Payer: Self-pay

## 2023-05-06 NOTE — Telephone Encounter (Signed)
Patient calling to speak with Cheree Ditto, [facility]. Transferred.

## 2023-05-06 NOTE — Telephone Encounter (Signed)
Patient came in on 8/12 for INR check and had some questions regarding some supplements that she would like to start, but was wondering what affect, if any, they had with her Coumadin.  She would like to discuss with you.

## 2023-05-16 DIAGNOSIS — R69 Illness, unspecified: Secondary | ICD-10-CM | POA: Diagnosis not present

## 2023-05-18 ENCOUNTER — Other Ambulatory Visit: Payer: Self-pay | Admitting: Cardiology

## 2023-05-18 DIAGNOSIS — I480 Paroxysmal atrial fibrillation: Secondary | ICD-10-CM

## 2023-05-21 DIAGNOSIS — M19011 Primary osteoarthritis, right shoulder: Secondary | ICD-10-CM | POA: Diagnosis not present

## 2023-05-29 ENCOUNTER — Ambulatory Visit: Payer: Medicare HMO | Attending: Internal Medicine | Admitting: *Deleted

## 2023-05-29 DIAGNOSIS — I480 Paroxysmal atrial fibrillation: Secondary | ICD-10-CM | POA: Diagnosis not present

## 2023-05-29 DIAGNOSIS — Z7901 Long term (current) use of anticoagulants: Secondary | ICD-10-CM | POA: Diagnosis not present

## 2023-05-29 LAB — POCT INR: INR: 3 (ref 2.0–3.0)

## 2023-05-29 NOTE — Patient Instructions (Addendum)
Description   Continue taking warfarin 1 tablet daily except 1/2 tablet on Mondays. Recheck INR in 4 weeks. Anticoagulation Clinic [phone]

## 2023-06-19 NOTE — Progress Notes (Signed)
[name] [name] Date of Birth: Aug 29, 1946   History of Present Illness: [name] [name] is seen for  followup. She has a history of atrial fibrillation that has been well controlled with sotalol since 2003. Prior use of Toprol caused her to be very fatigued and calcium channel blockers did not help.   She previously developed a bad pruritic rash all over. Skin biopsy was c/w a drug reaction. She was placed on steroids and Xarelto was stopped in favor of Eliquis. Rash did not clear on Eliquis and this was also stopped. Was started on Coumadin in July this year.  She has a history of OSA and is  on CPAP.   She was seen on April 21, 2019 for complaints of palpitations.  Noted more PVCs on her Apple phone but no AFib. A Zio monitor was placed which showed no Afib. PVCs with one brief run of NSVT and several brief runs of SVT. She started taking a magnesium supplement and this resolved her palpitations.  She underwent laparoscopic cholecystectomy 10/08/2022. About a month later she states she did have an episode of AFib lasting 20 minutes while driving.   She presented to the hospital on 01/04/2023 with a deep laceration to her lower extremity and a right frontal lobe mass. She had a head CT which showed a 4.4 cm right frontal lobe mass with associated vasogenic edema leading to 6 mm right to left midline shift. She complained of daily headaches confusion and seizure-like activity. She was seen in follow up by Dr Yetta Barre and underwent resection of large meningioma in May.   Current Outpatient Medications on File Prior to Visit  Medication Sig Dispense Refill   albuterol (PROAIR HFA) 108 (90 Base) MCG/ACT inhaler Inhale 2 puffs into the lungs every 6 (six) hours as needed. (Patient taking differently: Inhale 2 puffs into the lungs every 6 (six) hours as needed for wheezing or shortness of breath.) 1 Inhaler 5   b complex vitamins capsule Take 1 capsule by mouth daily.     chlorthalidone (HYGROTON) 25 MG tablet  Take 1 tablet (25 mg total) by mouth daily. 90 tablet 1   diphenhydrAMINE (BENADRYL) 25 MG tablet Take 25 mg by mouth See admin instructions. Every other night for sleep     doxylamine, Sleep, (UNISOM) 25 MG tablet Take 25 mg by mouth See admin instructions. Every other night for sleep     estradiol (VIVELLE-DOT) 0.025 MG/24HR Place 1 patch onto the skin 2 (two) times a week.     ezetimibe (ZETIA) 10 MG tablet TAKE 1 TABLET BY MOUTH EVERY DAY 90 tablet 3   fluticasone (FLONASE) 50 MCG/ACT nasal spray Place 2 sprays into both nostrils daily as needed for allergies.     HYDROcodone-acetaminophen (NORCO/VICODIN) 5-325 MG tablet Take 1 tablet by mouth every 6 (six) hours as needed for moderate pain. 30 tablet 0   irbesartan (AVAPRO) 300 MG tablet Take 300 mg by mouth daily.     Magnesium Oxide (MAG-OXIDE PO) Take 200 mg by mouth at bedtime.     methylPREDNISolone (MEDROL DOSEPAK) 4 MG TBPK tablet Take as directed 21 tablet 0   montelukast (SINGULAIR) 10 MG tablet Take 10 mg by mouth daily.     Multiple Vitamin (MULTI-VITAMINS) TABS Take 1 tablet by mouth daily.      Multiple Vitamins-Minerals (LUTEIN-ZEAXANTHIN PO) Take 1 tablet by mouth daily.     Multiple Vitamins-Minerals (OCUVITE EYE HEALTH FORMULA) CAPS one capsule by mouth as needed Oral  omeprazole (PRILOSEC) 20 MG capsule Take 20 mg by mouth daily as needed.     OVER THE COUNTER MEDICATION Take 2 tablets by mouth at bedtime. Restful monk sleep supplement (without melatonin)     predniSONE (DELTASONE) 5 MG tablet Take 5 mg by mouth daily with breakfast.     sotalol (BETAPACE) 80 MG tablet Take 80 mg by mouth 2 (two) times daily.     traZODone (DESYREL) 50 MG tablet Take 50 mg by mouth at bedtime.     venlafaxine XR (EFFEXOR-XR) 150 MG 24 hr capsule Take 150 mg by mouth daily.     Vitamin D, Ergocalciferol, (DRISDOL) 1.25 MG (50000 UNIT) CAPS capsule Take 50,000 Units by mouth once a week. Monday     warfarin (COUMADIN) 5 MG tablet TAKE 1  TABLET BY MOUTH IN THE AFTERNOON OR AS DIRECTED BY COUMADIN CLINIC 90 tablet 1   No current facility-administered medications on file prior to visit.    Allergies  Allergen Reactions   Atorvastatin Other (See Comments)    Muscle cramps severe      Simvastatin Other (See Comments)    Severe muscle cramps   Cephalosporins     'ran a fever"   Nickel Rash   Sulfa Antibiotics Hives   Sulfasalazine Hives   Eliquis [Apixaban] Hives and Itching   Xarelto [Rivaroxaban] Hives and Itching    Past Medical History:  Diagnosis Date   Anemia    years ago after surgery   Asthma 1976   Atrial fibrillation (HCC)    Bronchitis    Diabetes mellitus (HCC) 07/18/2020   Dysrhythmia    A-fib   FUO (fever of unknown origin) 03/03/2015   GERD (gastroesophageal reflux disease)    Hepatitis 1980s   Hepatitis    non A- non B   HTN (hypertension)    Hypercholesterolemia    Night sweat 03/03/2015   PMR (polymyalgia rheumatica) (HCC) 03/15/2015   Polyarthritis 03/03/2015   Polymyalgia (HCC) 03/03/2015   Polymyositis (HCC) 03/03/2015   Sleep apnea     Past Surgical History:  Procedure Laterality Date   ABDOMINAL HYSTERECTOMY     APPENDECTOMY  09/24/1971   APPLICATION OF CRANIAL NAVIGATION Right 02/05/2023   Procedure: APPLICATION OF CRANIAL NAVIGATION;  Surgeon: Tia Alert, MD;  Location: [facility] OR;  Service: Neurosurgery;  Laterality: Right;   CHOLECYSTECTOMY N/A 10/08/2022   Procedure: LAPAROSCOPIC CHOLECYSTECTOMY;  Surgeon: Manus Rudd, MD;  Location: [facility] OR;  Service: General;  Laterality: N/A;   CRANIOTOMY Right 02/05/2023   Procedure: Craniotomy - right for meningioma - Frontal;  Surgeon: Tia Alert, MD;  Location: [facility] OR;  Service: Neurosurgery;  Laterality: Right;   DG THUMB RIGHT HAND ([facility] HX)     DRUG INDUCED ENDOSCOPY N/A 11/14/2021   Procedure: DRUG INDUCED SLEEP ENDOSCOPY;  Surgeon: Christia Reading, MD;  Location: [facility];  Service: ENT;  Laterality: N/A;    FOOT SURGERY Left 09/24/2007   IMPLANTATION OF HYPOGLOSSAL NERVE STIMULATOR Right 12/18/2021   Procedure: IMPLANTATION OF HYPOGLOSSAL NERVE STIMULATOR;  Surgeon: Christia Reading, MD;  Location:  SURGERY CENTER;  Service: ENT;  Laterality: Right;   INTRAOPERATIVE CHOLANGIOGRAM N/A 10/08/2022   Procedure: INTRAOPERATIVE CHOLANGIOGRAM;  Surgeon: Manus Rudd, MD;  Location: [facility] OR;  Service: General;  Laterality: N/A;   KNEE ARTHROSCOPY  09/23/2005   right   OVARY SURGERY  09/24/1999   TUBAL LIGATION  09/23/1970   VAGINAL HYSTERECTOMY  09/24/1971    Social History  Tobacco Use  Smoking Status Former   Current packs/day: 0.00   Average packs/day: 0.2 packs/day for 5.0 years (1.0 ttl pk-yrs)   Types: Cigarettes   Start date: 09/24/1975   Quit date: 09/23/1980   Years since quitting: 42.7  Smokeless Tobacco Never    Social History   Substance and Sexual Activity  Alcohol Use Yes   Comment: 1 glass of wine a day    Family History  Problem Relation Age of Onset   Hypertension Sister    Lung cancer Father    Hypertension Mother    Multiple sclerosis Daughter    Breast cancer Other        maternal aunt   Allergies Daughter    Allergies Daughter    Allergies Son     Review of Systems: As noted in history of present illness.  All other systems were reviewed and are negative.  Physical Exam: BP 134/70 (BP Location: Right Arm, Cuff Size: Normal)   Pulse (!) 57   Ht 5\' 4"  (1.626 m)   Wt 200 lb 12.8 oz (91.1 kg)   SpO2 97%   BMI 34.47 kg/m  GENERAL:  Well appearing, obese WF in NAD HEENT:  PERRL, EOMI, sclera are clear. Oropharynx is clear. NECK:  No jugular venous distention, carotid upstroke brisk and symmetric, no bruits, no thyromegaly or adenopathy LUNGS:  Clear to auscultation bilaterally CHEST:  Unremarkable HEART:  RRR,  PMI not displaced or sustained,S1 and S2 within normal limits, no S3, no S4: no clicks, no rubs, no murmurs ABD:  Soft, nontender. BS  +, no masses or bruits. No hepatomegaly, no splenomegaly EXT:  2 + pulses throughout, no edema, no cyanosis no clubbing SKIN:  Warm and dry.  No rashes NEURO:  Alert and oriented x 3. Cranial nerves II through XII intact. PSYCH:  Cognitively intact    LBORATORY DATA:  Lab Results  Component Value Date   WBC 11.2 (H) 01/30/2023   HGB 13.1 01/30/2023   HCT 38.9 01/30/2023   PLT 181 01/30/2023   GLUCOSE 192 (H) 02/07/2023   ALT 21 01/30/2023   AST 24 01/30/2023   NA 134 (L) 02/07/2023   K 3.7 02/07/2023   CL 102 02/07/2023   CREATININE 1.06 (H) 02/07/2023   BUN 22 02/07/2023   CO2 20 (L) 02/07/2023   INR 3.0 05/29/2023   Labs dated 02/06/16: cholesterol 236, triglycerides 132, HDL 69, LDL 141. CMET and TSH normal.  November 27/2017: A1c 5.6%. Dated 02/12/17: cholesterol 251, triglycerides 226, HDL 59, LDL 147.  Dated 07/23/17: A1c 5.5% Dated 09/25/17: normal chemistries and TSH. Dated 03/29/19: cholesterol 275, triglycerides 333, HDL 63, LDL 145. LFTs normal.  Dated 11/18/19: Normal BMET. Dated 01/03/20: A1c 5.5% Dated 04/21/20: cholesterol 221, triglycerides 317, HDL 62, LDL 96. Potassium 3.6. CMET, CBC, TSH normal Dated 09/06/20: A1c 5.2%. Dated 06/25/22: cholesterol 93, triglycerides 207, HDL 48, LDL 119. TSH normal.  Dated 02/12/23: A1c 5.5%. CMET normal.    Event monitor: Study Highlights 05/12/19   Normal sinus rhythm Rare PVCs, trigeminy One 6 beat run of NSVT Rare PACs. 6 runs of SVT. longest 18 seconds at rate max 152. symptomatic.     Assessment / Plan: 1. Atrial fibrillation, well controlled on sotalol but some recurrence.  [place] Vasc score of 3. Intolerant of Xarelto and Eliquis due to drug rash. Will continue to monitor with [employer]. On Coumadin now.   2. Hypertension. Blood pressure is well controlled.   3. PMR on steroids.  4. OSA on CPAP- followed by Dr Vassie Loll  5. PVCs and short runs of SVT. Intolerant of beta blockers and calcium channel blockers in the  past.  Symptoms better on magnesium. Continue Sotalol.  6. S/p resection of large meningioma  7. S/p cholecystectomy  Follow up in one year

## 2023-06-27 ENCOUNTER — Ambulatory Visit (INDEPENDENT_AMBULATORY_CARE_PROVIDER_SITE_OTHER): Payer: Medicare HMO

## 2023-06-27 ENCOUNTER — Encounter: Payer: Self-pay | Admitting: Cardiology

## 2023-06-27 ENCOUNTER — Ambulatory Visit: Payer: Medicare HMO | Attending: Cardiology | Admitting: Cardiology

## 2023-06-27 VITALS — BP 134/70 | HR 57 | Ht 64.0 in | Wt 200.8 lb

## 2023-06-27 DIAGNOSIS — E782 Mixed hyperlipidemia: Secondary | ICD-10-CM | POA: Diagnosis not present

## 2023-06-27 DIAGNOSIS — I480 Paroxysmal atrial fibrillation: Secondary | ICD-10-CM

## 2023-06-27 DIAGNOSIS — Z7901 Long term (current) use of anticoagulants: Secondary | ICD-10-CM

## 2023-06-27 DIAGNOSIS — I10 Essential (primary) hypertension: Secondary | ICD-10-CM

## 2023-06-27 LAB — POCT INR: INR: 2.2 (ref 2.0–3.0)

## 2023-06-27 NOTE — Patient Instructions (Signed)
Medication Instructions:  Continue same medications *If you need a refill on your cardiac medications before your next appointment, please call your pharmacy*   Lab Work: None ordered   Testing/Procedures: None ordered   Follow-Up: At [facility], you and your health needs are our priority.  As part of our continuing mission to provide you with exceptional heart care, we have created designated Provider Care Teams.  These Care Teams include your primary Cardiologist (physician) and Advanced Practice Providers (APPs -  Physician Assistants and Nurse Practitioners) who all work together to provide you with the care you need, when you need it.  We recommend signing up for the patient portal called "MyChart".  Sign up information is provided on this After Visit Summary.  MyChart is used to connect with patients for Virtual Visits (Telemedicine).  Patients are able to view lab/test results, encounter notes, upcoming appointments, etc.  Non-urgent messages can be sent to your provider as well.   To learn more about what you can do with MyChart, go to [email or web address].    Your next appointment:  1 year   Call in June to schedule Oct appointment     Provider:  Dr.Jordan

## 2023-06-27 NOTE — Patient Instructions (Signed)
Continue taking warfarin 1 tablet daily except 1/2 tablet on Mondays. Recheck INR in 6 weeks. Anticoagulation Clinic [phone]

## 2023-07-04 DIAGNOSIS — I480 Paroxysmal atrial fibrillation: Secondary | ICD-10-CM | POA: Diagnosis not present

## 2023-07-04 DIAGNOSIS — Z1152 Encounter for screening for COVID-19: Secondary | ICD-10-CM | POA: Diagnosis not present

## 2023-07-04 DIAGNOSIS — R509 Fever, unspecified: Secondary | ICD-10-CM | POA: Diagnosis not present

## 2023-07-04 DIAGNOSIS — J309 Allergic rhinitis, unspecified: Secondary | ICD-10-CM | POA: Diagnosis not present

## 2023-07-04 DIAGNOSIS — J0100 Acute maxillary sinusitis, unspecified: Secondary | ICD-10-CM | POA: Diagnosis not present

## 2023-07-04 DIAGNOSIS — R197 Diarrhea, unspecified: Secondary | ICD-10-CM | POA: Diagnosis not present

## 2023-07-04 DIAGNOSIS — J45909 Unspecified asthma, uncomplicated: Secondary | ICD-10-CM | POA: Diagnosis not present

## 2023-07-10 DIAGNOSIS — R140 Abdominal distension (gaseous): Secondary | ICD-10-CM | POA: Diagnosis not present

## 2023-07-10 DIAGNOSIS — K9089 Other intestinal malabsorption: Secondary | ICD-10-CM | POA: Diagnosis not present

## 2023-07-10 DIAGNOSIS — K219 Gastro-esophageal reflux disease without esophagitis: Secondary | ICD-10-CM | POA: Diagnosis not present

## 2023-07-21 ENCOUNTER — Other Ambulatory Visit (HOSPITAL_COMMUNITY): Payer: Self-pay | Admitting: Neurological Surgery

## 2023-07-21 DIAGNOSIS — D320 Benign neoplasm of cerebral meninges: Secondary | ICD-10-CM

## 2023-07-23 ENCOUNTER — Ambulatory Visit (HOSPITAL_BASED_OUTPATIENT_CLINIC_OR_DEPARTMENT_OTHER): Payer: Medicare HMO | Admitting: Pulmonary Disease

## 2023-07-23 ENCOUNTER — Encounter (HOSPITAL_BASED_OUTPATIENT_CLINIC_OR_DEPARTMENT_OTHER): Payer: Self-pay | Admitting: Pulmonary Disease

## 2023-07-23 VITALS — BP 122/62 | HR 63 | Resp 16 | Ht 64.0 in | Wt 199.8 lb

## 2023-07-23 DIAGNOSIS — G4733 Obstructive sleep apnea (adult) (pediatric): Secondary | ICD-10-CM | POA: Diagnosis not present

## 2023-07-23 DIAGNOSIS — M19011 Primary osteoarthritis, right shoulder: Secondary | ICD-10-CM | POA: Diagnosis not present

## 2023-07-23 NOTE — Patient Instructions (Signed)
We decreased duration of therapy to 9 hours. Start delay remains at 60 minutes

## 2023-07-23 NOTE — Progress Notes (Signed)
Subjective:    Patient ID: [name] [name], female    DOB: 01/01/1946, 77 y.o.   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 Polymyalgia rheumatica was diagnosed in 2016, good response to steroids   She was intolerant of CPAP and underwent placement of hypoglossal nerve stimulator implant on 12/18/2021    Activation settings:  Sensation was at 0.5 V.  Post titration : We reset her remote to 1.2 V to a range of 1.0-1.4 She has 1 hour start delay, pause time of 15 minutes and a duration of 10 hours  41-month follow-up visit. She underwent surgery for meningioma. She reports that she is resting well using her device every night and all night. Bedtime is 9:30 PM takes her about an hour before she falls asleep, wakes up by 7 AM She had lost significant weight while on Ozempic but once she stopped as she is regaining the weight, BMI is at 34   Significant tests/ events reviewed inspire titration study 04/24/22 >>  Incoming amplitude was 1.3 V , good control of events was noted at 1.2 and 1.3 V and there was over-activation when increased to 1.5 V   08/2022 HST >> AHI of 8/hour, events predominantly in supine sleep, low saturation of 88% On inspire 1.2 v   HST 05/2017 AHI 32/h  Review of Systems neg for any significant sore throat, dysphagia, itching, sneezing, nasal congestion or excess/ purulent secretions, fever, chills, sweats, unintended wt loss, pleuritic or exertional cp, hempoptysis, orthopnea pnd or change in chronic leg swelling. Also denies presyncope, palpitations, heartburn, abdominal pain, nausea, vomiting, diarrhea or change in bowel or urinary habits, dysuria,hematuria, rash, arthralgias, visual complaints, headache, numbness weakness or ataxia.     Objective:   Physical Exam   Gen. Pleasant, obese, in no distress ENT - no  lesions, no post nasal drip Neck: No JVD, no thyromegaly, no carotid bruits Lungs: no use of accessory muscles, no dullness to percussion, decreased without rales or rhonchi  Cardiovascular: Rhythm regular, heart sounds  normal, no murmurs or gallops, no peripheral edema Musculoskeletal: No deformities, no cyanosis or clubbing , no tremors        Assessment & Plan:

## 2023-07-23 NOTE — Assessment & Plan Note (Addendum)
Inspire device was interrogated Start delay 60 minutes, duration of therapy in the program to 9 hours and pause maintained at 15 minutes Hypoglossal nerve stimulator was reassessed today.  Goal of the follow-up visit was to ensure good compliance, good subjective benefit, good tongue motion and good sense lead waveforms .  She denies any discomfort.  She is at level 1.2V    Sensing waveform was analyzed for 3 minutes She has an older remote which does not allow for automatic download

## 2023-07-28 ENCOUNTER — Ambulatory Visit (HOSPITAL_COMMUNITY)
Admission: RE | Admit: 2023-07-28 | Discharge: 2023-07-28 | Disposition: A | Discharge status: Home / Self Care | Payer: Medicare HMO | Source: Ambulatory Visit | Attending: Neurological Surgery | Admitting: Neurological Surgery

## 2023-07-28 DIAGNOSIS — D320 Benign neoplasm of cerebral meninges: Secondary | ICD-10-CM | POA: Diagnosis not present

## 2023-07-28 MED ORDER — GADOBUTROL 1 MMOL/ML IV SOLN
9.0000 mL | Freq: Once | INTRAVENOUS | Status: AC | PRN
  Administered 2023-07-28: 9 mL via INTRAVENOUS

## 2023-07-29 DIAGNOSIS — Z1231 Encounter for screening mammogram for malignant neoplasm of breast: Secondary | ICD-10-CM | POA: Diagnosis not present

## 2023-08-06 DIAGNOSIS — D689 Coagulation defect, unspecified: Secondary | ICD-10-CM | POA: Diagnosis not present

## 2023-08-06 DIAGNOSIS — G720 Drug-induced myopathy: Secondary | ICD-10-CM | POA: Diagnosis not present

## 2023-08-06 DIAGNOSIS — Z23 Encounter for immunization: Secondary | ICD-10-CM | POA: Diagnosis not present

## 2023-08-06 DIAGNOSIS — M8580 Other specified disorders of bone density and structure, unspecified site: Secondary | ICD-10-CM | POA: Diagnosis not present

## 2023-08-06 DIAGNOSIS — G4733 Obstructive sleep apnea (adult) (pediatric): Secondary | ICD-10-CM | POA: Diagnosis not present

## 2023-08-06 DIAGNOSIS — E1169 Type 2 diabetes mellitus with other specified complication: Secondary | ICD-10-CM | POA: Diagnosis not present

## 2023-08-06 DIAGNOSIS — F1010 Alcohol abuse, uncomplicated: Secondary | ICD-10-CM | POA: Diagnosis not present

## 2023-08-06 DIAGNOSIS — D329 Benign neoplasm of meninges, unspecified: Secondary | ICD-10-CM | POA: Diagnosis not present

## 2023-08-06 DIAGNOSIS — I10 Essential (primary) hypertension: Secondary | ICD-10-CM | POA: Diagnosis not present

## 2023-08-06 DIAGNOSIS — E785 Hyperlipidemia, unspecified: Secondary | ICD-10-CM | POA: Diagnosis not present

## 2023-08-06 DIAGNOSIS — I480 Paroxysmal atrial fibrillation: Secondary | ICD-10-CM | POA: Diagnosis not present

## 2023-08-06 DIAGNOSIS — Z0000 Encounter for general adult medical examination without abnormal findings: Secondary | ICD-10-CM | POA: Diagnosis not present

## 2023-08-06 DIAGNOSIS — M353 Polymyalgia rheumatica: Secondary | ICD-10-CM | POA: Diagnosis not present

## 2023-08-08 ENCOUNTER — Ambulatory Visit: Payer: Medicare HMO | Attending: Internal Medicine

## 2023-08-08 DIAGNOSIS — Z5181 Encounter for therapeutic drug level monitoring: Secondary | ICD-10-CM | POA: Diagnosis not present

## 2023-08-08 DIAGNOSIS — I480 Paroxysmal atrial fibrillation: Secondary | ICD-10-CM | POA: Diagnosis not present

## 2023-08-08 LAB — POCT INR: INR: 1.9 — AB (ref 2.0–3.0)

## 2023-08-08 NOTE — Patient Instructions (Signed)
Description   Take 1.5 tablets today and then continue taking warfarin 1 tablet daily except 1/2 tablet on Mondays.  Recheck INR in 5 weeks.  Anticoagulation Clinic [phone]

## 2023-08-14 DIAGNOSIS — D320 Benign neoplasm of cerebral meninges: Secondary | ICD-10-CM | POA: Diagnosis not present

## 2023-08-14 DIAGNOSIS — Z6834 Body mass index (BMI) 34.0-34.9, adult: Secondary | ICD-10-CM | POA: Diagnosis not present

## 2023-09-12 ENCOUNTER — Ambulatory Visit: Payer: Medicare HMO | Attending: Cardiology

## 2023-09-12 DIAGNOSIS — Z7901 Long term (current) use of anticoagulants: Secondary | ICD-10-CM

## 2023-09-12 DIAGNOSIS — I480 Paroxysmal atrial fibrillation: Secondary | ICD-10-CM

## 2023-09-12 LAB — POCT INR: INR: 2.5 (ref 2.0–3.0)

## 2023-09-12 NOTE — Patient Instructions (Signed)
continue taking warfarin 1 tablet daily except 1/2 tablet on Mondays.  Recheck INR in 6 weeks.  Anticoagulation Clinic [phone]

## 2023-09-30 DIAGNOSIS — M353 Polymyalgia rheumatica: Secondary | ICD-10-CM | POA: Diagnosis not present

## 2023-09-30 DIAGNOSIS — M1991 Primary osteoarthritis, unspecified site: Secondary | ICD-10-CM | POA: Diagnosis not present

## 2023-09-30 DIAGNOSIS — Z7952 Long term (current) use of systemic steroids: Secondary | ICD-10-CM | POA: Diagnosis not present

## 2023-09-30 DIAGNOSIS — M79644 Pain in right finger(s): Secondary | ICD-10-CM | POA: Diagnosis not present

## 2023-10-22 DIAGNOSIS — D329 Benign neoplasm of meninges, unspecified: Secondary | ICD-10-CM | POA: Diagnosis not present

## 2023-10-22 DIAGNOSIS — R051 Acute cough: Secondary | ICD-10-CM | POA: Diagnosis not present

## 2023-10-24 ENCOUNTER — Ambulatory Visit: Payer: Medicare Other

## 2023-10-27 ENCOUNTER — Ambulatory Visit: Payer: Medicare Other | Attending: Cardiovascular Disease

## 2023-10-27 DIAGNOSIS — Z7901 Long term (current) use of anticoagulants: Secondary | ICD-10-CM | POA: Diagnosis not present

## 2023-10-27 DIAGNOSIS — I480 Paroxysmal atrial fibrillation: Secondary | ICD-10-CM

## 2023-10-27 LAB — POCT INR: INR: 1.8 — AB (ref 2.0–3.0)

## 2023-10-27 NOTE — Patient Instructions (Signed)
Take 1 tablet today only then continue taking warfarin 1 tablet daily except 1/2 tablet on Mondays.  Recheck INR in 6 weeks.  Anticoagulation Clinic [phone]

## 2023-10-31 DIAGNOSIS — M19011 Primary osteoarthritis, right shoulder: Secondary | ICD-10-CM | POA: Diagnosis not present

## 2023-10-31 DIAGNOSIS — M19012 Primary osteoarthritis, left shoulder: Secondary | ICD-10-CM | POA: Diagnosis not present

## 2023-11-12 ENCOUNTER — Other Ambulatory Visit: Payer: Self-pay | Admitting: Cardiology

## 2023-11-12 DIAGNOSIS — I480 Paroxysmal atrial fibrillation: Secondary | ICD-10-CM

## 2023-11-27 ENCOUNTER — Encounter: Payer: Self-pay | Admitting: Pulmonary Disease

## 2023-11-27 ENCOUNTER — Encounter (HOSPITAL_BASED_OUTPATIENT_CLINIC_OR_DEPARTMENT_OTHER): Payer: Self-pay | Admitting: Pulmonary Disease

## 2023-11-27 ENCOUNTER — Ambulatory Visit (HOSPITAL_BASED_OUTPATIENT_CLINIC_OR_DEPARTMENT_OTHER): Admitting: Pulmonary Disease

## 2023-11-27 VITALS — BP 122/76 | HR 78 | Ht 64.0 in | Wt 202.7 lb

## 2023-11-27 DIAGNOSIS — Z9682 Presence of neurostimulator: Secondary | ICD-10-CM

## 2023-11-27 DIAGNOSIS — G4733 Obstructive sleep apnea (adult) (pediatric): Secondary | ICD-10-CM

## 2023-11-27 NOTE — Patient Instructions (Signed)
  We readjusted to  Level 1 = 0.9 V Increase by 1 level every week until tongue discomfort  Goal is level 4 =1.2 V which was your therapeutic level  Try to use every night

## 2023-11-27 NOTE — Progress Notes (Signed)
   Subjective:    Patient ID: [name] [name], female    DOB: July 17, 1946, 78 y.o.   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  Polymyalgia rheumatica was diagnosed in 2016, good response to steroids meningioma   She was intolerant of CPAP and underwent placement of hypoglossal nerve stimulator implant on 12/18/2021    Activation settings:  Sensation was at 0.5 V.   Post titration : We reset her remote to 1.2 V to a range of 1.0-1.4 She has 1 hour start delay, pause time of 15 minutes and a duration of 10 hours  22-month follow-up visit. She called since she was feeling increased stimulation.  She had a chest cold in January and was unable to use a device for a couple of weeks.  This is confirmed on download which shows that device was activated every night but then turned off. She is maintained on 1.2 V which was ultimately amplitude. She started using her device again in February with reasonable compliance up to 7 hours per night but over the last 2 weeks again feels that the occlusion is too strong.  She points to her neck   Significant tests/ events reviewed inspire titration study 04/24/22 >>  Incoming amplitude was 1.3 V , good control of events was noted at 1.2 and 1.3 V and there was over-activation when increased to 1.5 V   08/2022 HST >> AHI of 8/hour, events predominantly in supine sleep, low saturation of 88% On inspire 1.2 v   HST 05/2017 AHI 32/h  Review of Systems neg for any significant sore throat, dysphagia, itching, sneezing, nasal congestion or excess/ purulent secretions, fever, chills, sweats, unintended wt loss, pleuritic or exertional cp, hempoptysis, orthopnea pnd or change in chronic leg swelling. Also denies presyncope, palpitations, heartburn, abdominal pain, nausea, vomiting, diarrhea or change in bowel or urinary  habits, dysuria,hematuria, rash, arthralgias, visual complaints, headache, numbness weakness or ataxia.     Objective:   Physical Exam  Gen. Pleasant, obese, in no distress ENT - no lesions, no post nasal drip Neck: No JVD, no thyromegaly, no carotid bruits Lungs: no use of accessory muscles, no dullness to percussion, decreased without rales or rhonchi  Cardiovascular: Rhythm regular, heart sounds  normal, no murmurs or gallops, no peripheral edema Musculoskeletal: No deformities, no cyanosis or clubbing , no tremors       Assessment & Plan:    OSA status postplacement of hypoglossal nerve stimulator device.  -Device was interrogated with programmer.  Tongue stimulation was normal at 1.2 V but she seems to be very sensitive to this level of stimulation. Previously she would tolerate this level.  We lowered her amplitude to 0.9 V.  She will try to increase by 1 level every 1 to 2 weeks intermittent discomfort goal and plan would be to reach levels for equals 1.2 V If she is unable to do so then we will have to schedule home sleep test  The only explanation I have for her hypersensitivity to the same therapeutic level would be that she had intercurrent respiratory infection  Download was reviewed

## 2023-12-08 ENCOUNTER — Ambulatory Visit: Payer: Medicare Other | Attending: Cardiology

## 2023-12-08 DIAGNOSIS — I10 Essential (primary) hypertension: Secondary | ICD-10-CM | POA: Diagnosis not present

## 2023-12-08 DIAGNOSIS — Z7901 Long term (current) use of anticoagulants: Secondary | ICD-10-CM

## 2023-12-08 DIAGNOSIS — M353 Polymyalgia rheumatica: Secondary | ICD-10-CM | POA: Diagnosis not present

## 2023-12-08 DIAGNOSIS — I480 Paroxysmal atrial fibrillation: Secondary | ICD-10-CM

## 2023-12-08 DIAGNOSIS — E1169 Type 2 diabetes mellitus with other specified complication: Secondary | ICD-10-CM | POA: Diagnosis not present

## 2023-12-08 LAB — POCT INR: INR: 2.4 (ref 2.0–3.0)

## 2023-12-08 NOTE — Patient Instructions (Signed)
 continue taking warfarin 1 tablet daily except 1/2 tablet on Mondays.  Recheck INR in 6 weeks.  Anticoagulation Clinic [phone]

## 2024-01-12 ENCOUNTER — Other Ambulatory Visit (HOSPITAL_COMMUNITY): Payer: Self-pay | Admitting: Neurological Surgery

## 2024-01-12 DIAGNOSIS — D320 Benign neoplasm of cerebral meninges: Secondary | ICD-10-CM

## 2024-01-19 ENCOUNTER — Ambulatory Visit

## 2024-01-19 ENCOUNTER — Encounter

## 2024-01-20 DIAGNOSIS — H353131 Nonexudative age-related macular degeneration, bilateral, early dry stage: Secondary | ICD-10-CM | POA: Diagnosis not present

## 2024-01-20 DIAGNOSIS — Z961 Presence of intraocular lens: Secondary | ICD-10-CM | POA: Diagnosis not present

## 2024-01-21 ENCOUNTER — Encounter: Payer: Self-pay | Admitting: Cardiology

## 2024-01-21 ENCOUNTER — Ambulatory Visit: Attending: Cardiology

## 2024-01-21 DIAGNOSIS — I480 Paroxysmal atrial fibrillation: Secondary | ICD-10-CM

## 2024-01-21 LAB — POCT INR: INR: 2.5 (ref 2.0–3.0)

## 2024-01-21 NOTE — Patient Instructions (Signed)
 Description   Continue taking warfarin 1 tablet daily except 1/2 tablet on Mondays.  Recheck INR in 6 weeks.  Anticoagulation Clinic [phone]

## 2024-01-23 ENCOUNTER — Other Ambulatory Visit: Payer: Self-pay

## 2024-01-23 DIAGNOSIS — I480 Paroxysmal atrial fibrillation: Secondary | ICD-10-CM

## 2024-01-26 ENCOUNTER — Ambulatory Visit (HOSPITAL_COMMUNITY)
Admission: RE | Admit: 2024-01-26 | Discharge: 2024-01-26 | Disposition: A | Discharge status: Home / Self Care | Source: Ambulatory Visit | Attending: Neurological Surgery | Admitting: Neurological Surgery

## 2024-01-26 DIAGNOSIS — D320 Benign neoplasm of cerebral meninges: Secondary | ICD-10-CM | POA: Insufficient documentation

## 2024-01-26 MED ORDER — GADOBUTROL 1 MMOL/ML IV SOLN
9.0000 mL | Freq: Once | INTRAVENOUS | Status: AC | PRN
  Administered 2024-01-26: 9 mL via INTRAVENOUS

## 2024-01-27 ENCOUNTER — Ambulatory Visit (HOSPITAL_BASED_OUTPATIENT_CLINIC_OR_DEPARTMENT_OTHER): Admitting: Pulmonary Disease

## 2024-01-28 DIAGNOSIS — M353 Polymyalgia rheumatica: Secondary | ICD-10-CM | POA: Diagnosis not present

## 2024-01-28 DIAGNOSIS — Z7952 Long term (current) use of systemic steroids: Secondary | ICD-10-CM | POA: Diagnosis not present

## 2024-01-28 DIAGNOSIS — M79644 Pain in right finger(s): Secondary | ICD-10-CM | POA: Diagnosis not present

## 2024-01-28 DIAGNOSIS — M1991 Primary osteoarthritis, unspecified site: Secondary | ICD-10-CM | POA: Diagnosis not present

## 2024-02-03 DIAGNOSIS — Z6836 Body mass index (BMI) 36.0-36.9, adult: Secondary | ICD-10-CM | POA: Diagnosis not present

## 2024-02-03 DIAGNOSIS — Z01419 Encounter for gynecological examination (general) (routine) without abnormal findings: Secondary | ICD-10-CM | POA: Diagnosis not present

## 2024-02-03 NOTE — Progress Notes (Unsigned)
 Electrophysiology Office Note:    Date:  02/04/2024   ID:  [name] [name], DOB 1946-06-23, MRN [id number]  [facility] Cardiologist:  Peter Swaziland, MD  [facility] Electrophysiologist:  Boyce Byes, MD   Referring MD: Swaziland, Peter M, MD   Chief Complaint: Atrial fibrillation  History of Present Illness:    [name] is [name] 78 year old woman who I am seeing today for an evaluation of atrial fibrillation at the request of Dr. Swaziland.  The patient has [name] history of atrial fibrillation dating back to 2003 managed with sotalol .  She was previously on Xarelto  but this was changed to Eliquis because of [name] drug rash.  This was also subsequently stopped and she was transitioned to Coumadin .  Her medical history also includes [name] meningioma postresection, polymyalgia, sleep apnea, GERD.  Given her history of brain tumor postresection and trouble with blood thinners in the past, she is referred to discuss left atrial appendage occlusion.  She is with her husband today in clinic.  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  She is concerned about her bleeding risk while on anticoagulation.  She did not tolerate NOACs in the past.      Their past medical, social and family history was reviewed.   ROS:   Please see the history of present illness.    All other systems reviewed and are negative.  EKGs/Labs/Other Studies Reviewed:    The following studies were reviewed today:  May 13, 2019 heart monitor No atrial fibrillation  Feb 03, 2023 EKG shows sinus rhythm EKG Interpretation Date/Time:  Wednesday Feb 04 2024 11:24:40 EDT Ventricular Rate:  64 PR Interval:  144 QRS Duration:  80 QT Interval:  438 QTC Calculation: 451 [name] Axis:   17  Text Interpretation: Normal sinus rhythm Cannot rule out Confirmed by Harvie Liner [phone]) on 02/04/2024 11:42:54 AM    Physical Exam:    VS:  BP 100/66   Pulse 64   Ht 5\' 4"  (1.626 [name])   Wt 199 lb (90.3 kg)    SpO2 99%   BMI 34.16 kg/[name]     Wt Readings from Last 3 Encounters:  02/04/24 199 lb (90.3 kg)  11/27/23 202 lb 11.2 oz (91.9 kg)  07/23/23 199 lb 12.8 oz (90.6 kg)     GEN: no distress CARD: RRR, No MRG RESP: No IWOB. CTAB.        ASSESSMENT AND PLAN:    1. Paroxysmal atrial fibrillation (HCC)   2. Encounter for long-term (current) use of high-risk medication     #Atrial fibrillation #High risk med monitoring-sotalol  The patient has persistent atrial fibrillation managed with sotalol .  Today'[name] EKG showed QTc acceptable for ongoing sotalol  use.  She is currently on warfarin for stroke prophylaxis after having to stop Eliquis and Xarelto .  We discussed the role of left atrial appendage occlusion in patients with atrial fibrillation.  She is interested in proceeding with evaluation.  She is concerned about the bleeding risk of Coumadin  given her history of meningioma.  ---------------------  I have seen [name] [name] in the office today who is being considered for [name] Watchman left atrial appendage closure device. I believe they will benefit from this procedure given their history of atrial fibrillation, CHA2DS2-VASc score of 4 and unadjusted ischemic stroke rate of 4.8% per year. Unfortunately, the patient is not felt to be [name] long term anticoagulation candidate secondary to history of meningioma. The patient'[name] chart has been reviewed and I  feel that they would be [name] candidate for short term oral anticoagulation after Watchman implant.   It is my belief that after undergoing [name] LAA closure procedure, [name] [name] will not need long term anticoagulation which eliminates anticoagulation side effects and major bleeding risk.   Procedural risks for the Watchman implant have been reviewed with the patient including [name] 0.5% risk of stroke, <1% risk of perforation and <1% risk of device embolization. Other risks include bleeding, vascular damage, tamponade, worsening renal function, and  death. The patient understands these risk and wishes to proceed.     The published clinical data on the safety and effectiveness of WATCHMAN include but are not limited to the following: - Holmes DR, [name] [name], Sick P et al. for the PROTECT AF Investigators. Percutaneous closure of the left atrial appendage versus warfarin therapy for prevention of stroke in patients with atrial fibrillation: [name] randomised non-inferiority trial. Lancet 2009; 374: 534-42. [name] [name], [name] [name], [name] [name] et al. on behalf of the PROTECT AF Investigators. Percutaneous Left Atrial Appendage Closure for Stroke Prophylaxis in Patients With Atrial Fibrillation 2.3-Year Follow-up of the PROTECT AF (Watchman Left Atrial Appendage System for Embolic Protection in Patients With Atrial Fibrillation) Trial. Circulation 2013; 127:720-729. - [name] [name], [name] [name],  [name] [name], [name] VY, [name] [name] et al. Quality of Life Assessment in the Randomized PROTECT AF (Percutaneous Closure of the Left Atrial Appendage Versus Warfarin Therapy for Prevention of Stroke in Patients With Atrial Fibrillation) Trial of Patients at Risk for Stroke With Nonvalvular Atrial Fibrillation. [name] Am Coll Cardiol 2013; 61:1790-8. Bartholome Ligas DR, [name] [name], [name] [name], [name] [name], [name] [name], [name] [name], [name] [name], [name] [name]. Prospective randomized evaluation of the Watchman left atrial appendage Device in patients with atrial fibrillation versus long-term warfarin therapy; the PREVAIL trial. Journal of the [employer] of Cardiology, Vol. 4, No. 1, 2014, 1-11. - [name] [name], [name] [name], [name] [name], [name] [name], [name] [name] et al. Primary outcome evaluation of [name] next-generation left atrial appendage closure device: results from the PINNACLE FLX trial. Circulation 2021;143(18)1754-1762.    After today'[name] visit with the patient which was dedicated solely for shared decision making visit regarding LAA closure device, the patient decided to proceed with the LAA appendage closure procedure scheduled to be  done in the near future at [facility]. Prior to the procedure, I would like to obtain [name] gated CT scan of the chest with contrast timed for PV/LA visualization.   Additionally, the patient will need an updated echocardiogram.  HAS-BLED score 2 Hypertension Yes  Abnormal renal and liver function (Dialysis, transplant, Cr >2.26 mg/dL /Cirrhosis or Bilirubin >2x Normal or AST/ALT/AP >3x Normal) No  Stroke No  Bleeding No  Labile INR (Unstable/high INR) No  Elderly (>65) Yes  Drugs or alcohol  (>= 8 drinks/week, anti-plt or NSAID) No   CHA2DS2-VASc Score = 4  The patient'[name] score is based upon: CHF History: 0 HTN History: 1 Diabetes History: 0 Stroke History: 0 Vascular Disease History: 0 Age Score: 2 Gender Score: 1     Signed, Donelda Fujita T. Marven Slimmer, MD, [facility], [name] 02/04/2024 11:52 AM    Electrophysiology [facility]

## 2024-02-04 ENCOUNTER — Encounter: Payer: Self-pay | Admitting: Cardiology

## 2024-02-04 ENCOUNTER — Ambulatory Visit: Attending: Cardiology | Admitting: Cardiology

## 2024-02-04 VITALS — BP 100/66 | HR 64 | Ht 64.0 in | Wt 199.0 lb

## 2024-02-04 DIAGNOSIS — Z79899 Other long term (current) drug therapy: Secondary | ICD-10-CM | POA: Diagnosis not present

## 2024-02-04 DIAGNOSIS — I480 Paroxysmal atrial fibrillation: Secondary | ICD-10-CM | POA: Diagnosis not present

## 2024-02-04 NOTE — Patient Instructions (Signed)
 Medication Instructions:  Your physician recommends that you continue on your current medications as directed. Please refer to the Current Medication list given to you today.  *If you need a refill on your cardiac medications before your next appointment, please call your pharmacy*  Testing/Procedures: Echocardiogram  Your physician has requested that you have an echocardiogram. Echocardiography is a painless test that uses sound waves to create images of your heart. It provides your doctor with information about the size and shape of your heart and how well your heart's chambers and valves are working. This procedure takes approximately one hour. There are no restrictions for this procedure. Please do NOT wear cologne, perfume, aftershave, or lotions (deodorant is allowed). Please arrive 15 minutes prior to your appointment time.  Please note: We ask at that you not bring children with you during ultrasound (echo/ vascular) testing. Due to room size and safety concerns, children are not allowed in the ultrasound rooms during exams. Our front office staff cannot provide observation of children in our lobby area while testing is being conducted. An adult accompanying a patient to their appointment will only be allowed in the ultrasound room at the discretion of the ultrasound technician under special circumstances. We apologize for any inconvenience.  Cardiac CT Your physician has requested that you have cardiac CT. Cardiac computed tomography (CT) is a painless test that uses an x-ray machine to take clear, detailed pictures of your heart. For further information please visit [email or web address] Please follow instruction sheet as given. You will be called to schedule this test.   Watchman  Your physician has requested that you have Left atrial appendage (LAA) closure device implantation is a procedure to put a small device in the LAA of the heart. The LAA is a small sac in the wall of the heart's  left upper chamber. Blood clots can form in this area. The device, Watchman closes the LAA to help prevent a blood clot and stroke.  You will be contacted by Nurse Navigator, Larkin Plumb to schedule your pre-procedure visit and procedure date. If you have any questions she can be reached at [phone].   Follow-Up: At [facility], you and your health needs are our priority.  As part of our continuing mission to provide you with exceptional heart care, our providers are all part of one team.  This team includes your primary Cardiologist (physician) and Advanced Practice Providers or APPs (Physician Assistants and Nurse Practitioners) who all work together to provide you with the care you need, when you need it.

## 2024-02-10 ENCOUNTER — Other Ambulatory Visit (HOSPITAL_COMMUNITY)
Admission: RE | Admit: 2024-02-10 | Discharge: 2024-02-10 | Disposition: A | Discharge status: Home / Self Care | Source: Ambulatory Visit | Attending: Neurological Surgery | Admitting: Neurological Surgery

## 2024-02-10 ENCOUNTER — Ambulatory Visit (HOSPITAL_COMMUNITY)
Admission: RE | Admit: 2024-02-10 | Discharge: 2024-02-10 | Disposition: A | Discharge status: Home / Self Care | Source: Ambulatory Visit | Attending: Neurological Surgery | Admitting: Neurological Surgery

## 2024-02-10 ENCOUNTER — Other Ambulatory Visit (HOSPITAL_COMMUNITY): Payer: Self-pay | Admitting: Neurological Surgery

## 2024-02-10 DIAGNOSIS — I6782 Cerebral ischemia: Secondary | ICD-10-CM | POA: Diagnosis not present

## 2024-02-10 DIAGNOSIS — D320 Benign neoplasm of cerebral meninges: Secondary | ICD-10-CM | POA: Insufficient documentation

## 2024-02-10 DIAGNOSIS — G9389 Other specified disorders of brain: Secondary | ICD-10-CM | POA: Diagnosis not present

## 2024-02-10 LAB — CREATININE, SERUM
Creatinine, Ser: 1.07 mg/dL — ABNORMAL HIGH (ref 0.44–1.00)
GFR, Estimated: 53 mL/min — ABNORMAL LOW (ref >60)

## 2024-02-10 LAB — BUN: BUN: 23 mg/dL (ref 8–23)

## 2024-02-10 MED ORDER — IOHEXOL 300 MG/ML  SOLN
75.0000 mL | Freq: Once | INTRAMUSCULAR | Status: AC | PRN
  Administered 2024-02-10: 75 mL via INTRAVENOUS

## 2024-02-12 DIAGNOSIS — Z6834 Body mass index (BMI) 34.0-34.9, adult: Secondary | ICD-10-CM | POA: Diagnosis not present

## 2024-02-12 DIAGNOSIS — J0110 Acute frontal sinusitis, unspecified: Secondary | ICD-10-CM | POA: Diagnosis not present

## 2024-02-19 DIAGNOSIS — J324 Chronic pansinusitis: Secondary | ICD-10-CM | POA: Diagnosis not present

## 2024-02-20 ENCOUNTER — Encounter (HOSPITAL_COMMUNITY): Payer: Self-pay

## 2024-02-20 ENCOUNTER — Telehealth (HOSPITAL_COMMUNITY): Payer: Self-pay | Admitting: Emergency Medicine

## 2024-02-20 NOTE — Telephone Encounter (Signed)
 Reaching out to patient to offer assistance regarding upcoming cardiac imaging study; pt verbalizes understanding of appt date/time, parking situation and where to check in, pre-test NPO status and medications ordered, and verified current allergies; name and call back number provided for further questions should they arise Rockwell Alexandria RN Navigator Cardiac Imaging [facility] [phone] office [phone] cell

## 2024-02-23 ENCOUNTER — Ambulatory Visit (HOSPITAL_COMMUNITY)
Admission: RE | Admit: 2024-02-23 | Discharge: 2024-02-23 | Disposition: A | Discharge status: Home / Self Care | Source: Ambulatory Visit | Attending: Cardiovascular Disease | Admitting: Cardiovascular Disease

## 2024-02-23 DIAGNOSIS — R931 Abnormal findings on diagnostic imaging of heart and coronary circulation: Secondary | ICD-10-CM

## 2024-02-23 DIAGNOSIS — Z79899 Other long term (current) drug therapy: Secondary | ICD-10-CM | POA: Insufficient documentation

## 2024-02-23 DIAGNOSIS — I480 Paroxysmal atrial fibrillation: Secondary | ICD-10-CM | POA: Diagnosis not present

## 2024-02-23 HISTORY — DX: Abnormal findings on diagnostic imaging of heart and coronary circulation: R93.1

## 2024-02-23 MED ORDER — IOHEXOL 350 MG/ML SOLN
100.0000 mL | Freq: Once | INTRAVENOUS | Status: AC | PRN
  Administered 2024-02-23: 100 mL via INTRAVENOUS

## 2024-02-24 ENCOUNTER — Telehealth: Payer: Self-pay

## 2024-02-24 NOTE — Telephone Encounter (Signed)
 [name] [name]: Windsock morphology Max 22/ AVG 21.5/ Depth 19 Likely use a 27mm device Inf/Mid-Ant TSP RAO 25 CAU 13

## 2024-03-03 ENCOUNTER — Ambulatory Visit: Attending: Cardiology

## 2024-03-03 DIAGNOSIS — I480 Paroxysmal atrial fibrillation: Secondary | ICD-10-CM

## 2024-03-03 LAB — POCT INR: INR: 2.8 (ref 2.0–3.0)

## 2024-03-03 NOTE — Patient Instructions (Signed)
 Description   Continue taking warfarin 1 tablet daily except 1/2 tablet on Mondays.  Recheck INR in 7 weeks.  Anticoagulation Clinic [phone]

## 2024-03-05 ENCOUNTER — Other Ambulatory Visit: Payer: Self-pay | Admitting: Cardiology

## 2024-03-05 DIAGNOSIS — I480 Paroxysmal atrial fibrillation: Secondary | ICD-10-CM

## 2024-03-16 ENCOUNTER — Ambulatory Visit (HOSPITAL_COMMUNITY)
Admission: RE | Admit: 2024-03-16 | Discharge: 2024-03-16 | Disposition: A | Discharge status: Home / Self Care | Source: Ambulatory Visit | Attending: Internal Medicine | Admitting: Internal Medicine

## 2024-03-16 DIAGNOSIS — I480 Paroxysmal atrial fibrillation: Secondary | ICD-10-CM

## 2024-03-16 DIAGNOSIS — Z79899 Other long term (current) drug therapy: Secondary | ICD-10-CM

## 2024-03-16 LAB — ECHOCARDIOGRAM COMPLETE
Area-P 1/2: 3.48 cm2
P 1/2 time: 392 ms
S' Lateral: 3.1 cm

## 2024-03-18 ENCOUNTER — Ambulatory Visit: Payer: Self-pay | Admitting: Cardiology

## 2024-03-23 DIAGNOSIS — J320 Chronic maxillary sinusitis: Secondary | ICD-10-CM | POA: Diagnosis not present

## 2024-03-23 DIAGNOSIS — J322 Chronic ethmoidal sinusitis: Secondary | ICD-10-CM | POA: Diagnosis not present

## 2024-03-23 DIAGNOSIS — J323 Chronic sphenoidal sinusitis: Secondary | ICD-10-CM | POA: Diagnosis not present

## 2024-03-23 DIAGNOSIS — J324 Chronic pansinusitis: Secondary | ICD-10-CM | POA: Diagnosis not present

## 2024-03-25 ENCOUNTER — Ambulatory Visit (HOSPITAL_BASED_OUTPATIENT_CLINIC_OR_DEPARTMENT_OTHER): Admitting: Pulmonary Disease

## 2024-03-25 ENCOUNTER — Encounter (HOSPITAL_BASED_OUTPATIENT_CLINIC_OR_DEPARTMENT_OTHER): Payer: Self-pay | Admitting: Pulmonary Disease

## 2024-03-25 VITALS — BP 114/53 | HR 63 | Ht 64.0 in | Wt 195.0 lb

## 2024-03-25 DIAGNOSIS — Z9682 Presence of neurostimulator: Secondary | ICD-10-CM

## 2024-03-25 DIAGNOSIS — G4733 Obstructive sleep apnea (adult) (pediatric): Secondary | ICD-10-CM

## 2024-03-25 NOTE — Progress Notes (Signed)
 Subjective:    Patient ID: [name] [name], female    DOB: Apr 05, 1946, 78 y.o.   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  Polymyalgia rheumatica was diagnosed in 2016, good response to steroids meningioma   She was intolerant of CPAP and underwent placement of hypoglossal nerve stimulator implant on 12/18/2021    Activation :Sensation was at 0.5 V.   Post titration : We reset her remote to 1.2 V to a range of 1.0-1.4 She has 1 hour start delay, pause time of 15 minutes and a duration of 10 hours   11/2023  Tongue stimulation was normal at 1.2 V but she seems to be very sensitive to this level of stimulation. Previously she would tolerate this level.  hypersensitivity to the same therapeutic level due to ? intercurrent respiratory infection   We lowered her amplitude to 0.9 V.     4 month FU visit  She experiences discomfort with her hypoglossal nerve stimulator device, initially set at 1.2 volts and reduced to 0.9 volts due to hypersensitivity. She increased it to 1.0 volts, which has become uncomfortable over the past week, causing sleep disturbances and fatigue.  Her sinuses remain problematic, described as 'opacified'  with no change on a subsequent CT scan. A 21-day course of Augmentin did not resolve the sinus condition, and she has been referred for further evaluation.   Significant tests/ events reviewed inspire titration study 80/2/23 >>  Incoming amplitude was 1.3 V , good control of events was noted at 1.2 and 1.3 V and there was over-activation when increased to 1.5 V   80/2023 HST >> AHI of 8/hour, events predominantly in supine sleep, low saturation of 88% On inspire 1.2 v   HST 05/2017 AHI 32/h    Review of Systems  neg for any significant sore throat, dysphagia, itching, sneezing, nasal congestion or excess/ purulent  secretions, fever, chills, sweats, unintended wt loss, pleuritic or exertional cp, hempoptysis, orthopnea pnd or change in chronic leg swelling. Also denies presyncope, palpitations, heartburn, abdominal pain, nausea, vomiting, diarrhea or change in bowel or urinary habits, dysuria,hematuria, rash, arthralgias, visual complaints, headache, numbness weakness or ataxia.     Objective:   Physical Exam  Gen. Pleasant, obese, in no distress ENT - no lesions, no post nasal drip Neck: No JVD, no thyromegaly, no carotid bruits Lungs: no use of accessory muscles, no dullness to percussion, decreased without rales or rhonchi  Cardiovascular: Rhythm regular, heart sounds  normal, no murmurs or gallops, no peripheral edema Musculoskeletal: No deformities, no cyanosis or clubbing , no tremors   Programmer -stimulation checked at 1.0 V at different pulse width      Assessment & Plan:    Obstructive Sleep Apnea (OSA) Status post hypoglossal nerve stimulator device with hypersensitivity to previous settings at 1.2 volts, reduced to 0.9 volts. Currently at 1.0 volts, experiencing discomfort and sleep disturbances. Possible influence of ongoing sinusitis on device tolerance. Attempted adjustment of pulse width settings was ineffective. - Conduct home sleep test at current level of 1.0 volts to assess sleep quality and apnea events.  If inadequate control, options include alternative electrode configuration - Allow adjustment to lower level if discomfort persists, prioritizing comfort over higher stimulation levels.  Chronic Sinusitis Persistent sinusitis despite 21-day course of Augmentin. CT scan shows unchanged sinus opacification.  Referral to Dr. Mansfield at [facility] for potential surgical intervention due to lack of separation from sinus to brain. - Follow up with Dr. Mansfield at [facility] for evaluation and potential surgical intervention.

## 2024-03-25 NOTE — Patient Instructions (Signed)
 X Home sleep test at current level

## 2024-04-01 DIAGNOSIS — Z9889 Other specified postprocedural states: Secondary | ICD-10-CM | POA: Diagnosis not present

## 2024-04-01 DIAGNOSIS — J324 Chronic pansinusitis: Secondary | ICD-10-CM | POA: Diagnosis not present

## 2024-04-05 DIAGNOSIS — J310 Chronic rhinitis: Secondary | ICD-10-CM | POA: Diagnosis not present

## 2024-04-05 DIAGNOSIS — J341 Cyst and mucocele of nose and nasal sinus: Secondary | ICD-10-CM | POA: Diagnosis not present

## 2024-04-05 DIAGNOSIS — J324 Chronic pansinusitis: Secondary | ICD-10-CM | POA: Diagnosis not present

## 2024-04-07 NOTE — Telephone Encounter (Signed)
 Discussed with Dr. Cindie. OK to proceed.

## 2024-04-14 DIAGNOSIS — M19012 Primary osteoarthritis, left shoulder: Secondary | ICD-10-CM | POA: Diagnosis not present

## 2024-04-14 DIAGNOSIS — M19011 Primary osteoarthritis, right shoulder: Secondary | ICD-10-CM | POA: Diagnosis not present

## 2024-04-15 DIAGNOSIS — D320 Benign neoplasm of cerebral meninges: Secondary | ICD-10-CM | POA: Diagnosis not present

## 2024-04-15 DIAGNOSIS — Z6834 Body mass index (BMI) 34.0-34.9, adult: Secondary | ICD-10-CM | POA: Diagnosis not present

## 2024-04-15 DIAGNOSIS — J0110 Acute frontal sinusitis, unspecified: Secondary | ICD-10-CM | POA: Diagnosis not present

## 2024-04-16 ENCOUNTER — Telehealth: Payer: Self-pay | Admitting: *Deleted

## 2024-04-16 NOTE — Telephone Encounter (Signed)
   Pre-operative Risk Assessment    Patient Name: [name] [name]  DOB: Sep 26, 1945 MRN: [id number]   Date of last office visit: 02/04/24 DR. LAMBERT Date of next office visit: NONE   Request for Surgical Clearance    Procedure:  RIGHT FRONTAL CRANIECTOMY  Date of Surgery:  Clearance TBD                                Surgeon:  DR. ALM MOLT Surgeon's Group or Practice Name:  [facility] Phone number:  [phone] Fax number:  [phone] ATTN: SHANDA EXT [id number]   Type of Clearance Requested:   - Medical  - Pharmacy:  Hold Warfarin (Coumadin )     Type of Anesthesia:  General    Additional requests/questions:    [name]   04/16/2024, 5:07 PM

## 2024-04-20 ENCOUNTER — Encounter: Payer: Self-pay | Admitting: Cardiology

## 2024-04-20 ENCOUNTER — Encounter (HOSPITAL_BASED_OUTPATIENT_CLINIC_OR_DEPARTMENT_OTHER)

## 2024-04-20 ENCOUNTER — Other Ambulatory Visit: Payer: Self-pay | Admitting: Neurological Surgery

## 2024-04-20 DIAGNOSIS — G4733 Obstructive sleep apnea (adult) (pediatric): Secondary | ICD-10-CM

## 2024-04-20 DIAGNOSIS — G4730 Sleep apnea, unspecified: Secondary | ICD-10-CM | POA: Diagnosis not present

## 2024-04-20 DIAGNOSIS — Z9682 Presence of neurostimulator: Secondary | ICD-10-CM

## 2024-04-20 NOTE — Telephone Encounter (Signed)
 Please advise holding Coumadin  prior to right frontal craniectomy not yet scheduled.   Thank you!  DW

## 2024-04-21 ENCOUNTER — Encounter (HOSPITAL_BASED_OUTPATIENT_CLINIC_OR_DEPARTMENT_OTHER): Payer: Self-pay | Admitting: Pulmonary Disease

## 2024-04-21 ENCOUNTER — Ambulatory Visit: Attending: Cardiology

## 2024-04-21 DIAGNOSIS — I480 Paroxysmal atrial fibrillation: Secondary | ICD-10-CM | POA: Diagnosis not present

## 2024-04-21 LAB — POCT INR: INR: 1.6 — AB (ref 2.0–3.0)

## 2024-04-21 NOTE — Telephone Encounter (Signed)
   Name: [name] [name]  DOB: 04/22/1946  MRN: [id number]  Primary Cardiologist: Peter Swaziland, MD   Preoperative team, please contact this patient and set up a phone call appointment for further preoperative risk assessment. Please obtain consent and complete medication review. Thank you for your help.  I confirm that guidance regarding antiplatelet and oral anticoagulation therapy has been completed and, if necessary, noted below.  Patient has not had an Afib/aflutter ablation within the last 3 months or DCCV within the last 30 days   Per office protocol, patient can hold warfarin for 5 days prior to procedure.    Patient will not need bridging with Lovenox (enoxaparin) around procedure.  I also confirmed the patient resides in the state of [place] . As per [facility] Medical Board telemedicine laws, the patient must reside in the state in which the provider is licensed.   Lamarr Satterfield, NP 04/21/2024, 9:59 AM [facility]

## 2024-04-21 NOTE — Progress Notes (Signed)
 INR 1.6 Please see anticoagulation encounter

## 2024-04-21 NOTE — Telephone Encounter (Signed)
 Patient with diagnosis of afib on warfarin for anticoagulation.    Procedure: RIGHT FRONTAL CRANIECTOMY  Date of procedure: 05/04/24   CHA2DS2-VASc Score = 4   This indicates a 4.8% annual risk of stroke. The patient's score is based upon: CHF History: 0 HTN History: 1 Diabetes History: 0 Stroke History: 0 Vascular Disease History: 0 Age Score: 2 Gender Score: 1      CrCl 45 ml/min Platelet count 197  Patient has not had an Afib/aflutter ablation within the last 3 months or DCCV within the last 30 days  Per office protocol, patient can hold warfarin for 5 days prior to procedure.    Patient will not need bridging with Lovenox (enoxaparin) around procedure.  **This guidance is not considered finalized until pre-operative APP has relayed final recommendations.**

## 2024-04-21 NOTE — Patient Instructions (Signed)
 Description   Take 1.5 tablets today and 1.5 tablets tomorrow and then continue taking warfarin 1 tablet daily except 1/2 tablet on Mondays.  HOLD Warfarin 5 days prior to procedure.  Recheck INR 1 week post procedure Anticoagulation Clinic [phone]

## 2024-04-21 NOTE — Telephone Encounter (Signed)
 FYI

## 2024-04-21 NOTE — Telephone Encounter (Signed)
 Spoke to patient Dr.Jordan advised ok to hold Coumadin  5 days prior to surgery.

## 2024-04-22 NOTE — Telephone Encounter (Signed)
 Spoke to patient and she will have clearance on 04/26/24 with Josefa Beauvais, NP. Consent and med list was completed.     Patient Consent for Virtual Visit        [name] [name] has provided verbal consent on 04/22/2024 for a virtual visit (video or telephone).   CONSENT FOR VIRTUAL VISIT FOR:  [name] [name]  By participating in this virtual visit I agree to the following:  I hereby voluntarily request, consent and authorize [facility] and its employed or contracted physicians, physician assistants, nurse practitioners or other licensed health care professionals (the Practitioner), to provide me with telemedicine health care services (the "Services) as deemed necessary by the treating Practitioner. I acknowledge and consent to receive the Services by the Practitioner via telemedicine. I understand that the telemedicine visit will involve communicating with the Practitioner through live audiovisual communication technology and the disclosure of certain medical information by electronic transmission. I acknowledge that I have been given the opportunity to request an in-person assessment or other available alternative prior to the telemedicine visit and am voluntarily participating in the telemedicine visit.  I understand that I have the right to withhold or withdraw my consent to the use of telemedicine in the course of my care at any time, without affecting my right to future care or treatment, and that the Practitioner or I may terminate the telemedicine visit at any time. I understand that I have the right to inspect all information obtained and/or recorded in the course of the telemedicine visit and may receive copies of available information for a reasonable fee.  I understand that some of the potential risks of receiving the Services via telemedicine include:  Delay or interruption in medical evaluation due to technological equipment failure or disruption; Information transmitted  may not be sufficient (e.g. poor resolution of images) to allow for appropriate medical decision making by the Practitioner; and/or  In rare instances, security protocols could fail, causing a breach of personal health information.  Furthermore, I acknowledge that it is my responsibility to provide information about my medical history, conditions and care that is complete and accurate to the best of my ability. I acknowledge that Practitioner's advice, recommendations, and/or decision may be based on factors not within their control, such as incomplete or inaccurate data provided by me or distortions of diagnostic images or specimens that may result from electronic transmissions. I understand that the practice of medicine is not an exact science and that Practitioner makes no warranties or guarantees regarding treatment outcomes. I acknowledge that a copy of this consent can be made available to me via my patient portal [facility] MyChart), or I can request a printed copy by calling the office of [facility].    I understand that my insurance will be billed for this visit.   I have read or had this consent read to me. I understand the contents of this consent, which adequately explains the benefits and risks of the Services being provided via telemedicine.  I have been provided ample opportunity to ask questions regarding this consent and the Services and have had my questions answered to my satisfaction. I give my informed consent for the services to be provided through the use of telemedicine in my medical care

## 2024-04-23 NOTE — Progress Notes (Signed)
 Virtual Visit via Telephone Note   Because of [name] [name] co-morbid illnesses, she is at least at moderate risk for complications without adequate follow up.  This format is felt to be most appropriate for this patient at this time.  Due to technical limitations with video connection (technology), today's appointment will be conducted as an audio only telehealth visit, and [name] [name] verbally agreed to proceed in this manner.   All issues noted in this document were discussed and addressed.  No physical exam could be performed with this format.  Evaluation Performed:  Preoperative cardiovascular risk assessment _____________   Date:  04/23/2024   Patient ID:  [name] [name], DOB [birth year], MRN [id number] Patient Location:  Home Provider location:   Office  Primary Care Provider:  Janey Santos, MD Primary Cardiologist:  Peter Swaziland, MD  Chief Complaint / Patient Profile   78 y.o. y/o female with a h/o atrial fibrillation, hypertension who is pending RIGHT FRONTAL CRANIECTOMY  and presents today for telephonic preoperative cardiovascular risk assessment.  History of Present Illness    [name] [name] is a 78 y.o. female who presents via audio/video conferencing for a telehealth visit today.  Pt was last seen in cardiology clinic on 02/04/2024 by Dr. Cindie.  At that time [name] [name] was doing well .  The patient is now pending procedure as outlined above. Since her last visit, she continues to be stable from a cardiac standpoint.  Today she denies chest pain, shortness of breath, lower extremity edema, fatigue, palpitations, melena, hematuria, hemoptysis, diaphoresis, weakness, presyncope, syncope, orthopnea, and PND.   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   Polymyalgia (HCC) 03/03/2015   Polymyositis (HCC) 03/03/2015   Sleep apnea    Past Surgical History:  Procedure Laterality Date   ABDOMINAL HYSTERECTOMY     APPENDECTOMY  09/24/1971   APPLICATION OF CRANIAL NAVIGATION Right 02/05/2023   Procedure: APPLICATION OF CRANIAL NAVIGATION;  Surgeon: Joshua Alm RAMAN, MD;  Location: [facility] OR;  Service: Neurosurgery;  Laterality: Right;   CHOLECYSTECTOMY N/A 10/08/2022   Procedure: LAPAROSCOPIC CHOLECYSTECTOMY;  Surgeon: Belinda Cough, MD;  Location: [facility] OR;  Service: General;  Laterality: N/A;   CRANIOTOMY Right 02/05/2023   Procedure: Craniotomy - right for meningioma - Frontal;  Surgeon: Joshua Alm RAMAN, MD;  Location: [facility] OR;  Service: Neurosurgery;  Laterality: Right;   DG THUMB RIGHT HAND ([facility] HX)     DRUG INDUCED ENDOSCOPY N/A 11/14/2021   Procedure: DRUG INDUCED SLEEP ENDOSCOPY;  Surgeon: Carlie Clark, MD;  Location: [facility];  Service: ENT;  Laterality: N/A;   FOOT SURGERY Left 09/24/2007   IMPLANTATION OF HYPOGLOSSAL NERVE STIMULATOR Right 12/18/2021   Procedure: IMPLANTATION OF HYPOGLOSSAL NERVE STIMULATOR;  Surgeon: Carlie Clark, MD;  Location: [facility];  Service: ENT;  Laterality: Right;   INTRAOPERATIVE CHOLANGIOGRAM N/A 10/08/2022   Procedure: INTRAOPERATIVE CHOLANGIOGRAM;  Surgeon: Belinda Cough,  MD;  Location: [facility] OR;  Service: General;  Laterality: N/A;   KNEE ARTHROSCOPY  09/23/2005   right   OVARY SURGERY  09/24/1999   TUBAL LIGATION  09/23/1970   VAGINAL HYSTERECTOMY  09/24/1971    Allergies  Allergies  Allergen Reactions   Atorvastatin Other (See Comments)    Muscle cramps severe      Simvastatin Other (See Comments)     Severe muscle cramps   Cephalosporins     'ran a fever   Nickel Rash   Sulfa Antibiotics Hives   Sulfasalazine Hives   Eliquis [Apixaban] Hives and Itching   Xarelto  [Rivaroxaban ] Hives and Itching    Home Medications    Prior to Admission medications   Medication Sig Start Date End Date Taking? Authorizing Provider  albuterol  (PROAIR  HFA) 108 (90 Base) MCG/ACT inhaler Inhale 2 puffs into the lungs every 6 (six) hours as needed. 09/14/18   Jude Harden GAILS, MD  b complex vitamins capsule Take 1 capsule by mouth daily.    [provider]  chlorthalidone  (HYGROTON ) 25 MG tablet Take 1 tablet (25 mg total) by mouth daily. 03/06/16   Swaziland, Peter M, MD  diphenhydrAMINE  (BENADRYL ) 25 MG tablet Take 25 mg by mouth at bedtime.    [provider]  doxylamine, Sleep, (UNISOM) 25 MG tablet Take 25 mg by mouth at bedtime.    [provider]  estradiol (VIVELLE-DOT) 0.025 MG/24HR Place 1 patch onto the skin 2 (two) times a week. 06/02/23   [provider]  ezetimibe  (ZETIA ) 10 MG tablet TAKE 1 TABLET BY MOUTH EVERY DAY 07/24/22   [place]aziland, Peter M, MD  fluticasone  (FLONASE ) 50 MCG/ACT nasal spray Place 2 sprays into both nostrils daily. 08/28/19   [provider]  irbesartan  (AVAPRO ) 300 MG tablet Take 300 mg by mouth daily.    [provider]  Magnesium  Oxide (MAG-OXIDE PO) Take 200 mg by mouth at bedtime.    [provider]  montelukast  (SINGULAIR ) 10 MG tablet Take 10 mg by mouth at bedtime. 10/17/19   [provider]  Multiple Vitamin (MULTI-VITAMINS) TABS Take 1 tablet by mouth daily.     [provider]  Multiple Vitamins-Minerals (OCUVITE EYE HEALTH FORMULA) CAPS Take 1 tablet by mouth daily. 12/30/11   [provider]  omeprazole (PRILOSEC) 20 MG capsule Take 20 mg by mouth daily.    [provider]  OVER THE COUNTER MEDICATION Take 2 tablets by mouth at bedtime. Restful monk sleep supplement (without  melatonin)    [provider]  predniSONE  (DELTASONE ) 5 MG tablet Take 5 mg by mouth daily with breakfast.    [provider]  REPATHA SURECLICK 140 MG/ML SOAJ Inject 140 mg into the skin every 14 (fourteen) days. 04/10/24   [provider]  sotalol  (BETAPACE ) 80 MG tablet Take 80 mg by mouth 2 (two) times daily.    [provider]  traZODone (DESYREL) 50 MG tablet Take 50 mg by mouth at bedtime. 01/10/23   [provider]  TRYPTOPHAN  PO Take 1 capsule by mouth at bedtime.    [provider]  venlafaxine  XR (EFFEXOR -XR) 150 MG 24 hr capsule Take 150 mg by mouth daily.    [provider]  Vitamin D, Ergocalciferol, (DRISDOL) 1.25 MG (50000 UNIT) CAPS capsule Take 50,000 Units by mouth every Monday. 10/21/22   [provider]  warfarin (COUMADIN ) 5 MG tablet TAKE 1 TABLET BY MOUTH IN THE AFTERNOON OR AS DIRECTED BY  COUMADIN  CLINIC 03/05/24   Cindie Ole DASEN, MD    Physical Exam    Vital Signs:  MAKAYLAH ODDO does not have vital signs available for review today.  Given telephonic nature of communication, physical exam is limited. AAOx3. NAD. Normal affect.  Speech and respirations are unlabored.  Accessory Clinical Findings    None  Assessment & Plan    1.  Preoperative Cardiovascular Risk Assessment:RIGHT FRONTAL CRANIECTOMY   Date of Surgery:  Clearance TBD                                  Surgeon:  DR. ALM MOLT Surgeon's Group or Practice Name:  [facility] Phone number:  [phone] Fax number:  [phone] ATTN: SHANDA EXT [id number]      Primary Cardiologist: Peter Swaziland, MD  Chart reviewed as part of pre-operative protocol coverage. Given past medical history and time since last visit, based on ACC/AHA guidelines, Lashaya C Thang would be at acceptable risk for the planned procedure without further cardiovascular testing.   Her RCRI is low risk, 0.9% risk of major cardiac event.   She is able to complete greater than 4 METS of physical activity.  Patient was advised that if she develops new symptoms prior to surgery to contact our office to arrange a follow-up appointment.  He verbalized understanding.  Per office protocol, patient can hold warfarin for 5 days prior to procedure.    Patient will not need bridging with Lovenox (enoxaparin) around procedure.  I will route this recommendation to the requesting party via Epic fax function and remove from pre-op pool.       Time:   Today, I have spent 5 minutes with the patient with telehealth technology discussing medical history, symptoms, and management plan.  I spent 10 minutes reviewing patient's past cardiac history and cardiac medications.    Josefa CHRISTELLA Beauvais, NP  04/23/2024, 3:08 PM

## 2024-04-26 ENCOUNTER — Ambulatory Visit: Attending: Cardiovascular Disease

## 2024-04-26 DIAGNOSIS — Z01810 Encounter for preprocedural cardiovascular examination: Secondary | ICD-10-CM | POA: Diagnosis not present

## 2024-04-26 DIAGNOSIS — I10 Essential (primary) hypertension: Secondary | ICD-10-CM | POA: Diagnosis not present

## 2024-04-26 DIAGNOSIS — E1169 Type 2 diabetes mellitus with other specified complication: Secondary | ICD-10-CM | POA: Diagnosis not present

## 2024-04-26 NOTE — Pre-Procedure Instructions (Signed)
 Surgical Instructions   Your procedure is scheduled on May 04, 2024. Report to [facility] Main Entrance A at 11:00 A.M., then check in with the Admitting office. Any questions or running late day of surgery: call [phone]  Questions prior to your surgery date: call [phone], Monday-Friday, 8am-4pm. If you experience any cold or flu symptoms such as cough, fever, chills, shortness of breath, etc. between now and your scheduled surgery, please notify us  at the above number.     Remember:  Do not eat after midnight the night before your surgery   You may drink clear liquids until 10:00 AM the morning of your surgery.   Clear liquids allowed are: Water, Non-Citrus Juices (without pulp), Carbonated Beverages, Clear Tea (no milk, honey, etc.), Black Coffee Only (NO MILK, CREAM OR POWDERED CREAMER of any kind), and Gatorade.    Take these medicines the morning of surgery with A SIP OF WATER: ezetimibe  (ZETIA )  fluticasone  (FLONASE ) nasal spray  omeprazole (PRILOSEC)  predniSONE  (DELTASONE )  sotalol  (BETAPACE )  venlafaxine  XR (EFFEXOR -XR)    May take these medicines IF NEEDED: albuterol  (PROAIR  HFA) inhaler - please bring inhaler with you day of surgery   Follow your surgeon's instructions on when to stop warfarin (COUMADIN ).  If no instructions were given by your surgeon then you will need to call the office to get those instructions.     One week prior to surgery, STOP taking any Aspirin (unless otherwise instructed by your surgeon) Aleve, Naproxen, Ibuprofen , Motrin , Advil , Goody's, BC's, all herbal medications, fish oil, and non-prescription vitamins.                     Do NOT Smoke (Tobacco/Vaping) for 24 hours prior to your procedure.  If you use a CPAP at night, you may bring your mask/headgear for your overnight stay.   You will be asked to remove any contacts, glasses, piercing's, hearing aid's, dentures/partials prior to surgery. Please bring cases for these  items if needed.    Patients discharged the day of surgery will not be allowed to drive home, and someone needs to stay with them for 24 hours.  SURGICAL WAITING ROOM VISITATION Patients may have no more than 2 support people in the waiting area - these visitors may rotate.   Pre-op nurse will coordinate an appropriate time for 1 ADULT support person, who may not rotate, to accompany patient in pre-op.  Children under the age of 51 must have an adult with them who is not the patient and must remain in the main waiting area with an adult.  If the patient needs to stay at the hospital during part of their recovery, the visitor guidelines for inpatient rooms apply.  Please refer to the [facility] website for the visitor guidelines for any additional information.   If you received a COVID test during your pre-op visit  it is requested that you wear a mask when out in public, stay away from anyone that may not be feeling well and notify your surgeon if you develop symptoms. If you have been in contact with anyone that has tested positive in the last 10 days please notify you surgeon.      Pre-operative CHG Bathing Instructions   You can play a key role in reducing the risk of infection after surgery. Your skin needs to be as free of germs as possible. You can reduce the number of germs on your skin by washing with CHG (chlorhexidine  gluconate) soap before  surgery. CHG is an antiseptic soap that kills germs and continues to kill germs even after washing.   DO NOT use if you have an allergy  to chlorhexidine /CHG or antibacterial soaps. If your skin becomes reddened or irritated, stop using the CHG and notify one of our RNs at [phone].              TAKE A SHOWER THE NIGHT BEFORE SURGERY AND THE DAY OF SURGERY    Please keep in mind the following:  DO NOT shave, including legs and underarms, 48 hours prior to surgery.   You may shave your face before/day of surgery.  Place clean sheets on  your bed the night before surgery Use a clean washcloth (not used since being washed) for each shower. DO NOT sleep with pet's night before surgery.  CHG Shower Instructions:  Wash your face and private area with normal soap. If you choose to wash your hair, wash first with your normal shampoo.  After you use shampoo/soap, rinse your hair and body thoroughly to remove shampoo/soap residue.  Turn the water OFF and apply half the bottle of CHG soap to a CLEAN washcloth.  Apply CHG soap ONLY FROM YOUR NECK DOWN TO YOUR TOES (washing for 3-5 minutes)  DO NOT use CHG soap on face, private areas, open wounds, or sores.  Pay special attention to the area where your surgery is being performed.  If you are having back surgery, having someone wash your back for you may be helpful. Wait 2 minutes after CHG soap is applied, then you may rinse off the CHG soap.  Pat dry with a clean towel  Put on clean pajamas    Additional instructions for the day of surgery: DO NOT APPLY any lotions, deodorants, cologne, or perfumes.   Do not wear jewelry or makeup Do not wear nail polish, gel polish, artificial nails, or any other type of covering on natural nails (fingers and toes) Do not bring valuables to the hospital. [facility] is not responsible for valuables/personal belongings. Put on clean/comfortable clothes.  Please brush your teeth.  Ask your nurse before applying any prescription medications to the skin.

## 2024-04-27 ENCOUNTER — Encounter (HOSPITAL_COMMUNITY): Payer: Self-pay

## 2024-04-27 ENCOUNTER — Encounter (HOSPITAL_COMMUNITY)
Admission: RE | Admit: 2024-04-27 | Discharge: 2024-04-27 | Disposition: A | Discharge status: Home / Self Care | Source: Ambulatory Visit | Attending: Neurological Surgery | Admitting: Neurological Surgery

## 2024-04-27 ENCOUNTER — Other Ambulatory Visit: Payer: Self-pay

## 2024-04-27 VITALS — BP 182/62 | HR 61 | Temp 97.9°F | Resp 18 | Ht 64.0 in | Wt 198.6 lb

## 2024-04-27 DIAGNOSIS — F1090 Alcohol use, unspecified, uncomplicated: Secondary | ICD-10-CM | POA: Diagnosis not present

## 2024-04-27 DIAGNOSIS — E7800 Pure hypercholesterolemia, unspecified: Secondary | ICD-10-CM | POA: Insufficient documentation

## 2024-04-27 DIAGNOSIS — E119 Type 2 diabetes mellitus without complications: Secondary | ICD-10-CM | POA: Insufficient documentation

## 2024-04-27 DIAGNOSIS — M353 Polymyalgia rheumatica: Secondary | ICD-10-CM | POA: Diagnosis not present

## 2024-04-27 DIAGNOSIS — T847XXA Infection and inflammatory reaction due to other internal orthopedic prosthetic devices, implants and grafts, initial encounter: Secondary | ICD-10-CM | POA: Diagnosis not present

## 2024-04-27 DIAGNOSIS — Z01812 Encounter for preprocedural laboratory examination: Secondary | ICD-10-CM | POA: Diagnosis not present

## 2024-04-27 DIAGNOSIS — Z86011 Personal history of benign neoplasm of the brain: Secondary | ICD-10-CM | POA: Insufficient documentation

## 2024-04-27 DIAGNOSIS — I10 Essential (primary) hypertension: Secondary | ICD-10-CM | POA: Insufficient documentation

## 2024-04-27 DIAGNOSIS — Z87891 Personal history of nicotine dependence: Secondary | ICD-10-CM | POA: Insufficient documentation

## 2024-04-27 DIAGNOSIS — I480 Paroxysmal atrial fibrillation: Secondary | ICD-10-CM | POA: Insufficient documentation

## 2024-04-27 DIAGNOSIS — Z7901 Long term (current) use of anticoagulants: Secondary | ICD-10-CM | POA: Diagnosis not present

## 2024-04-27 DIAGNOSIS — Z01818 Encounter for other preprocedural examination: Secondary | ICD-10-CM

## 2024-04-27 DIAGNOSIS — G4733 Obstructive sleep apnea (adult) (pediatric): Secondary | ICD-10-CM | POA: Diagnosis not present

## 2024-04-27 HISTORY — DX: Cardiac murmur, unspecified: R01.1

## 2024-04-27 LAB — COMPREHENSIVE METABOLIC PANEL WITH GFR
ALT: 25 U/L (ref 0–44)
AST: 26 U/L (ref 15–41)
Albumin: 3.8 g/dL (ref 3.5–5.0)
Alkaline Phosphatase: 82 U/L (ref 38–126)
Anion gap: 13 (ref 5–15)
BUN: 30 mg/dL — ABNORMAL HIGH (ref 8–23)
CO2: 22 mmol/L (ref 22–32)
Calcium: 8.8 mg/dL — ABNORMAL LOW (ref 8.9–10.3)
Chloride: 99 mmol/L (ref 98–111)
Creatinine, Ser: 1.13 mg/dL — ABNORMAL HIGH (ref 0.44–1.00)
GFR, Estimated: 49 mL/min — ABNORMAL LOW (ref >60)
Glucose, Bld: 169 mg/dL — ABNORMAL HIGH (ref 70–99)
Potassium: 3.8 mmol/L (ref 3.5–5.1)
Sodium: 134 mmol/L — ABNORMAL LOW (ref 135–145)
Total Bilirubin: 1.1 mg/dL (ref 0.0–1.2)
Total Protein: 7.2 g/dL (ref 6.5–8.1)

## 2024-04-27 LAB — CBC
HCT: 41.7 % (ref 36.0–46.0)
Hemoglobin: 13.7 g/dL (ref 12.0–15.0)
MCH: 32.5 pg (ref 26.0–34.0)
MCHC: 32.9 g/dL (ref 30.0–36.0)
MCV: 98.8 fL (ref 80.0–100.0)
Platelets: 200 K/uL (ref 150–400)
RBC: 4.22 MIL/uL (ref 3.87–5.11)
RDW: 13.7 % (ref 11.5–15.5)
WBC: 10.4 K/uL (ref 4.0–10.5)
nRBC: 0 % (ref 0.0–0.2)

## 2024-04-27 LAB — TYPE AND SCREEN
ABO/RH(D): O POS
Antibody Screen: NEGATIVE

## 2024-04-27 NOTE — Progress Notes (Signed)
 PCP - Dr. Searcy Overcast Cardiologist - Dr. Peter Swaziland - last office visit 06/27/2023 Electrophysiologist - Dr. Ole Holts - last office visit 02/04/2024 Pulmonologist - Dr. Harden Staff  PPM/ICD - Denies Device Orders - n/a Rep Notified - n/a  Chest x-ray - n/a EKG - 02/04/2024 Stress Test - Per pt, 20+ years ago in [place] . Result normal ECHO - 03/16/2024 Cardiac Cath - Denies  Sleep Study - +OSA. Pt has Inspire device. Pt instructed to bring remote with her day of surgery.  No DM  Last dose of GLP1 agonist- n/a GLP1 instructions: n/a  Blood Thinner Instructions: Per Coumadin  clinic instructions, pt is to hold warfarin 5 days prior to surgery. Last dose will be August 6th. Aspirin Instructions: n/a  ERAS Protcol - Clear liquids until 1000 morning of surgery PRE-SURGERY Ensure or G2- n/a  COVID TEST- n/a   Anesthesia review: Yes. Cardiac clearance.  Patient denies shortness of breath, fever, cough and chest pain at PAT appointment. Pt denies any respiratory illness/infection in the last two months.    All instructions explained to the patient, with a verbal understanding of the material. Patient agrees to go over the instructions while at home for a better understanding. Patient also instructed to self quarantine after being tested for COVID-19. The opportunity to ask questions was provided.

## 2024-04-28 ENCOUNTER — Encounter (HOSPITAL_COMMUNITY): Payer: Self-pay

## 2024-04-28 NOTE — Progress Notes (Signed)
 Anesthesia Chart Review:  Case: [id number] Date/Time: 05/04/24 1245   Procedure: CRANIOTOMY BONE FLAP/PROSTHETIC PLATE (Right) - 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   Anesthesia type: General   Diagnosis: Infection of bone flap, initial encounter (HCC) [U15.7XXA]   Pre-op diagnosis: Infection of bone flap   [facility] ROOM [facility] / [facility]   Surgeons: Joshua Alm Hamilton, MD       DISCUSSION: Patient is an 69 female scheduled for the above procedure.  History includes former smoker (quit 09/23/80), PAF (diagnosed ~ 2003, adverse reaction to Eliquis and Xarelto ; on warfarin), elevated CAC (02/23/24), murmur (mild-moderate MR, moderate AR 03/16/24), hypercholesterolemia, asthma, HTN, DM2, polymyalgia rheumatica (2016, on chronic prednisone ), OSA (s/p hypoglossal nerve stimulator 12/18/21), GERD, hepatitis (1980's), cholecystectomy (10/08/22), right frontal meningioma (s/p right frontal craniotomy for resection 02/05/23). 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   She is s/p right frontal meatotomy for resection of a large meningioma on 02/05/2023.  She had issues with chronic pansinusitis and frontal mucocele and was referred to [facility] Bard Ill, MD and seen on 04/05/24. He noted that MRI in May 2025 showed fluid collection in scalp that appearred to track under bone flap which might be separate from her frontal sinus. She had also had dental implant that could had entered the sinus and trigger sinus disease. He discussed full right sided FESS, left max antrostomy / sphenoidotomy but also wanted neurosurgery to evaluation fluid collection. Per Addendum, spoke with Dr. Tanda / [facility]; imaging findings are concerning for an infected bone flap; he recommends Dr. Joshua / [facility] re-evaluate urgently; he relayed for Dr. Joshua to call him if he needs help with management; we will plan to proceed with the sinus surgery as discussed in future, but this active infection takes precedence over her more  chronic sinus disease; patient made aware....contacted by Radiology; they agree with findings suggestive of infected bone flap and adjacent subperiosteal abscess of adjacent scalp, which appears contiguous with an extra-axial fluid collection overlying the frontal lobe .    She is followed by Dr. [place] and Dr. Cindie for elevated CAC and afib. She was being considered for Watchman device (see CV testing below), but then referred to neurosurgery by ENT for concern of infected bone flap. She had preoperative cardiology evaluation on 04/26/24 by Emelia Hazy, NP. He wrote, Given past medical history and time since last visit, based on ACC/AHA guidelines, Shelvia C Swor would be at acceptable risk for the planned procedure without further cardiovascular testing.    Her RCRI is low risk, 0.9% risk of major cardiac event.  She is able to complete greater than 4 METS of physical activity.... Per office protocol, patient can hold warfarin for 5 days prior to procedure.    Patient will not need bridging with Lovenox (enoxaparin) around procedure. She also notified Dr. [place] and Dr. Jude of surgery plans.   She will bring Inspire remote for surgery.   Labs as indicated on arrival. Anesthesia team to evaluate on the day of surgery.    VS: BP (!) 182/62   Pulse 61   Temp 36.6 C   Resp 18   Ht 5' 4 (1.626 m)   Wt 90.1 kg   SpO2 99%   BMI 34.09 kg/m    PROVIDERS: Janey Santos, MD is PCP  Jude Donning, MD is pulmonologist, last visit 03/25/24.  Mai Agent, MD is rheumatologist [place], Peter, MD is cardiologist Cindie Smalls, MD is EP cardiologist, last  visit 02/04/24. She may be considered for Watchman Device in the future. Carlie Clark, MD is ENT   LABS: Labs reviewed: Acceptable for surgery. (all labs ordered are listed, but only abnormal results are displayed)  Labs Reviewed  COMPREHENSIVE METABOLIC PANEL WITH GFR - Abnormal; Notable for the following components:       Result Value   Sodium 134 (*)    Glucose, Bld 169 (*)    BUN 30 (*)    Creatinine, Ser 1.13 (*)    Calcium 8.8 (*)    GFR, Estimated 49 (*)    All other components within normal limits  CBC  TYPE AND SCREEN    OTHER: Per Telephone encounter by Dr. Jude on 09/06/22: 09/04/22: HST showed AHI of 8/hour, events predominantly in supine sleep, low saturation of 88% On inspire set is 1.2   Options include -Continue at same setting and avoid sleeping in supine sleep -Go up 1 level on the remote.    IMAGES: CT Chest (over read CT cardiac morph) 02/23/24: IMPRESSION: No significant extracardiac findings within the visualized chest.   MRI Brain 01/26/24: IMPRESSION: 1. Operative changes of right frontal craniotomy for meningioma resection. Mildly regressed dural thickening and enhancement subjacent to the craniotomy. 2. Widening of the medial aspect of the craniotomy defect with associated peripherally enhancing collection in the overlying scalp extending into the craniotomy defect. Findings concerning for abscess. Correlate with physical examination. 3. Chronic paranasal sinus mucosal disease with inspissated secretions or fungal colonization within the maxillary sinuses.      EKG: 02/04/24: NSR   CV: Echo 03/16/24: IMPRESSIONS   1. Left ventricular ejection fraction, by estimation, is 60 to 65%. The  left ventricle has normal function. The left ventricle has no regional  wall motion abnormalities. Left ventricular diastolic parameters are  consistent with Grade I diastolic  dysfunction (impaired relaxation).   2. Right ventricular systolic function is normal. The right ventricular  size is normal.   3. Left atrial size was mildly dilated.   4. Right atrial size was mildly dilated.   5. The mitral valve is abnormal. Mild to moderate mitral valve  regurgitation. No evidence of mitral stenosis. There is mild late systolic  prolapse of both leaflets of the mitral valve.   6.  The aortic valve is tricuspid. There is mild calcification of the  aortic valve. Aortic valve regurgitation is moderate. No aortic stenosis  is present.   7. The inferior vena cava is normal in size with greater than 50%  respiratory variability, suggesting right atrial pressure of 3 mmHg.    CT Cardiac Morph/Pulm Vein (pre-Watchman) 02/23/24: IMPRESSION: 1. There is no thrombus in the left atrial appendage. LAA landing zone measures 22mm x 20mm in diameter with depth 15mm, appropriate for a 27mm FLX device 2. Coronary calcium score 99 (51st percentile). Appears at least moderate stenosis in proximal LAD, but study was performed without use of NTG and insufficient for plaque evaluation; recommend coronary CTA to evaluate for obstructive CAD - Comparison CT CAC 07/03/21: CAC 49.7 (44th percentile), aortic atherosclerosis, small-moderate hiatal hernia   Long term monitor 04/25/19-05/08/19: Normal sinus rhythm Rare PVCs, trigeminy One 6 beat run of NSVT Rare PACs. 6 runs of SVT. longest 18 seconds at rate max 152. symptomatic.   Past Medical History:  Diagnosis Date   Anemia    years ago after surgery   Atrial fibrillation (HCC)    Bronchitis    Dysrhythmia    A-fib   FUO (  fever of unknown origin) 03/03/2015   GERD (gastroesophageal reflux disease)    Heart murmur    Mild to moderate mitral valve regurgitation   Hepatitis 1980s   Hepatitis    non A- non B   HTN (hypertension)    Hypercholesterolemia    Night sweat 03/03/2015   PMR (polymyalgia rheumatica) (HCC) 03/15/2015   Polyarthritis 03/03/2015   Polymyalgia (HCC) 03/03/2015   Sleep apnea    Pt has Inspire Device    Past Surgical History:  Procedure Laterality Date   ABDOMINAL HYSTERECTOMY  1973   APPENDECTOMY  09/24/1971   APPLICATION OF CRANIAL NAVIGATION Right 02/05/2023   Procedure: APPLICATION OF CRANIAL NAVIGATION;  Surgeon: Joshua Alm RAMAN, MD;  Location: [facility] OR;  Service: Neurosurgery;  Laterality: Right;    CHOLECYSTECTOMY N/A 10/08/2022   Procedure: LAPAROSCOPIC CHOLECYSTECTOMY;  Surgeon: Belinda Cough, MD;  Location: [facility] OR;  Service: General;  Laterality: N/A;   CRANIOTOMY Right 02/05/2023   Procedure: Craniotomy - right for meningioma - Frontal;  Surgeon: Joshua Alm RAMAN, MD;  Location: [facility] OR;  Service: Neurosurgery;  Laterality: Right;   DRUG INDUCED ENDOSCOPY N/A 11/14/2021   Procedure: DRUG INDUCED SLEEP ENDOSCOPY;  Surgeon: Carlie Clark, MD;  Location: [facility];  Service: ENT;  Laterality: N/A;   FOOT SURGERY Left 09/24/2007   IMPLANTATION OF HYPOGLOSSAL NERVE STIMULATOR Right 12/18/2021   Procedure: IMPLANTATION OF HYPOGLOSSAL NERVE STIMULATOR;  Surgeon: Carlie Clark, MD;  Location: [facility];  Service: ENT;  Laterality: Right;   INTRAOPERATIVE CHOLANGIOGRAM N/A 10/08/2022   Procedure: INTRAOPERATIVE CHOLANGIOGRAM;  Surgeon: Belinda Cough, MD;  Location: [facility];  Service: General;  Laterality: N/A;   KNEE ARTHROSCOPY  09/23/2005   right   OOPHORECTOMY Bilateral 2001   TUBAL LIGATION  09/23/1970    MEDICATIONS:  albuterol  (PROAIR  HFA) 108 (90 Base) MCG/ACT inhaler   b complex vitamins capsule   chlorthalidone  (HYGROTON ) 25 MG tablet   diphenhydrAMINE (BENADRYL) 25 MG tablet   doxylamine, Sleep, (UNISOM) 25 MG tablet   estradiol (VIVELLE-DOT) 0.025 MG/24HR   ezetimibe  (ZETIA ) 10 MG tablet   fluticasone  (FLONASE ) 50 MCG/ACT nasal spray   irbesartan  (AVAPRO ) 300 MG tablet   Magnesium  Oxide (MAG-OXIDE PO)   montelukast  (SINGULAIR ) 10 MG tablet   Multiple Vitamin (MULTI-VITAMINS) TABS   Multiple Vitamins-Minerals (OCUVITE EYE HEALTH FORMULA) CAPS   omeprazole (PRILOSEC) 20 MG capsule   OVER THE COUNTER MEDICATION   predniSONE  (DELTASONE ) 5 MG tablet   REPATHA SURECLICK 140 MG/ML SOAJ   sotalol  (BETAPACE ) 80 MG tablet   traZODone (DESYREL) 50 MG tablet   TRYPTOPHAN PO   venlafaxine  XR (EFFEXOR -XR) 150 MG 24 hr capsule   Vitamin D,  Ergocalciferol, (DRISDOL) 1.25 MG (50000 UNIT) CAPS capsule   warfarin (COUMADIN ) 5 MG tablet   No current facility-administered medications for this encounter.    Isaiah Ruder, PA-C Surgical Short Stay/Anesthesiology [facility] Phone [phone] [facility] Phone [phone] 04/28/2024 5:45 PM

## 2024-04-28 NOTE — Anesthesia Preprocedure Evaluation (Addendum)
 Anesthesia Evaluation  Patient identified by MRN, date of birth, ID band Patient awake    Reviewed: Allergy  & Precautions, NPO status , Patient's Chart, lab work & pertinent test results  Airway Mallampati: III  TM Distance: >3 FB Neck ROM: Full    Dental  (+) Dental Advisory Given, Teeth Intact   Pulmonary asthma , sleep apnea , former smoker   Pulmonary exam normal breath sounds clear to auscultation       Cardiovascular hypertension, Pt. on medications + CAD  + dysrhythmias Atrial Fibrillation + Valvular Problems/Murmurs AI and MR  Rhythm:Regular Rate:Normal + Systolic murmurs Echo 03/16/24: 1. Left ventricular ejection fraction, by estimation, is 60 to 65%. The left ventricle has normal function. The left ventricle has no regional wall motion abnormalities. Left ventricular diastolic parameters are consistent with Grade I diastolic dysfunction (impaired relaxation).  2. Right ventricular systolic function is normal. The right ventricular size is normal.  3. Left atrial size was mildly dilated.  4. Right atrial size was mildly dilated.  5. The mitral valve is abnormal. Mild to moderate mitral valve regurgitation. No evidence of mitral stenosis. There is mild late systolic prolapse of both leaflets of the mitral valve.  6. The aortic valve is tricuspid. There is mild calcification of the aortic valve. Aortic valve regurgitation is moderate. No aortic stenosis is present.  7. The inferior vena cava is normal in size with greater than 50% respiratory variability, suggesting right atrial pressure of 3 mmHg.    CT Cardiac Morph/Pulm Vein (pre-Watchman) 02/23/24:  1. There is no thrombus in the left atrial appendage. LAA landing zone measures 22mm x 20mm in diameter with depth 15mm, appropriate for a 27mm FLX device 2. Coronary calcium score 99 (51st percentile). Appears at least moderate stenosis in proximal LAD, but study was  performed without use of NTG and insufficient for plaque evaluation; recommend coronary CTA to evaluate for obstructive CAD - Comparison CT CAC 07/03/21: CAC 49.7 (44th percentile), aortic atherosclerosis, small-moderate hiatal hernia     Neuro/Psych negative neurological ROS  negative psych ROS   GI/Hepatic ,GERD  Medicated,,(+) Hepatitis -  Endo/Other  diabetes    Renal/GU negative Renal ROS     Musculoskeletal  (+) Arthritis ,    Abdominal  (+) + obese  Peds  Hematology  (+) Blood dyscrasia, anemia   Anesthesia Other Findings   Reproductive/Obstetrics                              Anesthesia Physical Anesthesia Plan  ASA: 3  Anesthesia Plan: General   Post-op Pain Management: Tylenol  PO (pre-op)*   Induction: Intravenous  PONV Risk Score and Plan: 4 or greater and Ondansetron , Treatment may vary due to age or medical condition and Dexamethasone   Airway Management Planned: Oral ETT  Additional Equipment:   Intra-op Plan:   Post-operative Plan: Extubation in OR  Informed Consent: I have reviewed the patients History and Physical, chart, labs and discussed the procedure including the risks, benefits and alternatives for the proposed anesthesia with the patient or authorized representative who has indicated his/her understanding and acceptance.     Dental advisory given  Plan Discussed with: CRNA  Anesthesia Plan Comments: (Discussed with Dr. Joshua. He states this will be quick and will attempt to not even enter the dura. He believes arterial line is not needed.    PAT note written 04/28/2024 by Allison Zelenak, PA-C.  )  Anesthesia Quick Evaluation

## 2024-05-04 ENCOUNTER — Encounter (HOSPITAL_COMMUNITY): Payer: Self-pay | Admitting: Neurological Surgery

## 2024-05-04 ENCOUNTER — Inpatient Hospital Stay (HOSPITAL_COMMUNITY): Payer: Self-pay | Admitting: Vascular Surgery

## 2024-05-04 ENCOUNTER — Other Ambulatory Visit: Payer: Self-pay

## 2024-05-04 ENCOUNTER — Inpatient Hospital Stay (HOSPITAL_COMMUNITY): Payer: Self-pay | Admitting: Certified Registered Nurse Anesthetist

## 2024-05-04 ENCOUNTER — Inpatient Hospital Stay (HOSPITAL_COMMUNITY)
Admission: RE | Admit: 2024-05-04 | Discharge: 2024-05-05 | DRG: 902 | Disposition: A | Discharge status: Home / Self Care | Attending: Neurosurgery | Admitting: Neurosurgery

## 2024-05-04 ENCOUNTER — Encounter (HOSPITAL_COMMUNITY)
Admission: RE | Disposition: A | Discharge status: Home / Self Care | Payer: Self-pay | Source: Home / Self Care | Attending: Neurological Surgery

## 2024-05-04 DIAGNOSIS — G4733 Obstructive sleep apnea (adult) (pediatric): Secondary | ICD-10-CM | POA: Diagnosis present

## 2024-05-04 DIAGNOSIS — I480 Paroxysmal atrial fibrillation: Secondary | ICD-10-CM | POA: Diagnosis not present

## 2024-05-04 DIAGNOSIS — Y832 Surgical operation with anastomosis, bypass or graft as the cause of abnormal reaction of the patient, or of later complication, without mention of misadventure at the time of the procedure: Secondary | ICD-10-CM | POA: Diagnosis present

## 2024-05-04 DIAGNOSIS — Z801 Family history of malignant neoplasm of trachea, bronchus and lung: Secondary | ICD-10-CM

## 2024-05-04 DIAGNOSIS — M868X8 Other osteomyelitis, other site: Secondary | ICD-10-CM | POA: Diagnosis not present

## 2024-05-04 DIAGNOSIS — K219 Gastro-esophageal reflux disease without esophagitis: Secondary | ICD-10-CM | POA: Diagnosis not present

## 2024-05-04 DIAGNOSIS — Z8249 Family history of ischemic heart disease and other diseases of the circulatory system: Secondary | ICD-10-CM | POA: Diagnosis not present

## 2024-05-04 DIAGNOSIS — Z01818 Encounter for other preprocedural examination: Secondary | ICD-10-CM

## 2024-05-04 DIAGNOSIS — I10 Essential (primary) hypertension: Secondary | ICD-10-CM | POA: Diagnosis present

## 2024-05-04 DIAGNOSIS — M8618 Other acute osteomyelitis, other site: Secondary | ICD-10-CM | POA: Diagnosis not present

## 2024-05-04 DIAGNOSIS — E1169 Type 2 diabetes mellitus with other specified complication: Secondary | ICD-10-CM | POA: Diagnosis present

## 2024-05-04 DIAGNOSIS — M869 Osteomyelitis, unspecified: Secondary | ICD-10-CM | POA: Diagnosis not present

## 2024-05-04 DIAGNOSIS — M353 Polymyalgia rheumatica: Secondary | ICD-10-CM | POA: Diagnosis present

## 2024-05-04 DIAGNOSIS — M130 Polyarthritis, unspecified: Secondary | ICD-10-CM | POA: Diagnosis present

## 2024-05-04 DIAGNOSIS — Z7952 Long term (current) use of systemic steroids: Secondary | ICD-10-CM | POA: Diagnosis not present

## 2024-05-04 DIAGNOSIS — Z7901 Long term (current) use of anticoagulants: Secondary | ICD-10-CM | POA: Diagnosis not present

## 2024-05-04 DIAGNOSIS — E7800 Pure hypercholesterolemia, unspecified: Secondary | ICD-10-CM | POA: Diagnosis not present

## 2024-05-04 DIAGNOSIS — J45909 Unspecified asthma, uncomplicated: Secondary | ICD-10-CM | POA: Diagnosis not present

## 2024-05-04 DIAGNOSIS — Z9049 Acquired absence of other specified parts of digestive tract: Secondary | ICD-10-CM | POA: Diagnosis not present

## 2024-05-04 DIAGNOSIS — Z820 Family history of epilepsy and other diseases of the nervous system: Secondary | ICD-10-CM | POA: Diagnosis not present

## 2024-05-04 DIAGNOSIS — T847XXA Infection and inflammatory reaction due to other internal orthopedic prosthetic devices, implants and grafts, initial encounter: Secondary | ICD-10-CM

## 2024-05-04 DIAGNOSIS — J329 Chronic sinusitis, unspecified: Secondary | ICD-10-CM | POA: Diagnosis not present

## 2024-05-04 DIAGNOSIS — Z87891 Personal history of nicotine dependence: Secondary | ICD-10-CM

## 2024-05-04 DIAGNOSIS — Z882 Allergy status to sulfonamides status: Secondary | ICD-10-CM

## 2024-05-04 DIAGNOSIS — Z9889 Other specified postprocedural states: Principal | ICD-10-CM

## 2024-05-04 DIAGNOSIS — I2510 Atherosclerotic heart disease of native coronary artery without angina pectoris: Secondary | ICD-10-CM

## 2024-05-04 DIAGNOSIS — Z90710 Acquired absence of both cervix and uterus: Secondary | ICD-10-CM | POA: Diagnosis not present

## 2024-05-04 DIAGNOSIS — I080 Rheumatic disorders of both mitral and aortic valves: Secondary | ICD-10-CM | POA: Diagnosis present

## 2024-05-04 DIAGNOSIS — J321 Chronic frontal sinusitis: Secondary | ICD-10-CM | POA: Diagnosis not present

## 2024-05-04 DIAGNOSIS — Z803 Family history of malignant neoplasm of breast: Secondary | ICD-10-CM

## 2024-05-04 DIAGNOSIS — T86832 Bone graft infection: Principal | ICD-10-CM | POA: Diagnosis present

## 2024-05-04 DIAGNOSIS — Z888 Allergy status to other drugs, medicaments and biological substances status: Secondary | ICD-10-CM | POA: Diagnosis not present

## 2024-05-04 DIAGNOSIS — J320 Chronic maxillary sinusitis: Secondary | ICD-10-CM | POA: Diagnosis not present

## 2024-05-04 HISTORY — PX: CRANIOTOMY: SHX93

## 2024-05-04 LAB — PROTIME-INR
INR: 1 (ref 0.8–1.2)
Prothrombin Time: 13.5 s (ref 11.4–15.2)

## 2024-05-04 LAB — MRSA NEXT GEN BY PCR, NASAL: MRSA by PCR Next Gen: NOT DETECTED

## 2024-05-04 SURGERY — CRANIOTOMY BONE FLAP/PROSTHETIC PLATE
Anesthesia: General | Laterality: Right

## 2024-05-04 MED ORDER — EPHEDRINE SULFATE-NACL 50-0.9 MG/10ML-% IV SOSY
PREFILLED_SYRINGE | INTRAVENOUS | Status: DC | PRN
  Administered 2024-05-04 (×4): 5 mg via INTRAVENOUS

## 2024-05-04 MED ORDER — PANTOPRAZOLE SODIUM 40 MG IV SOLR
40.0000 mg | Freq: Every day | INTRAVENOUS | Status: DC
  Administered 2024-05-04 (×2): 40 mg via INTRAVENOUS
  Filled 2024-05-04: qty 10

## 2024-05-04 MED ORDER — MONTELUKAST SODIUM 10 MG PO TABS
10.0000 mg | ORAL_TABLET | Freq: Every day | ORAL | Status: DC
  Administered 2024-05-04 (×2): 10 mg via ORAL
  Filled 2024-05-04: qty 1

## 2024-05-04 MED ORDER — ADHERUS DURAL SEALANT
PACK | TOPICAL | Status: DC | PRN
Start: 2024-05-04 — End: 2024-05-04
  Administered 2024-05-04 (×2): 1 via TOPICAL

## 2024-05-04 MED ORDER — CHLORTHALIDONE 25 MG PO TABS
25.0000 mg | ORAL_TABLET | Freq: Every day | ORAL | Status: DC
  Administered 2024-05-04 – 2024-05-05 (×4): 25 mg via ORAL
  Filled 2024-05-04 (×2): qty 1

## 2024-05-04 MED ORDER — LIDOCAINE-EPINEPHRINE 1 %-1:100000 IJ SOLN
INTRAMUSCULAR | Status: AC
  Filled 2024-05-04: qty 1

## 2024-05-04 MED ORDER — THROMBIN 20000 UNITS EX SOLR
CUTANEOUS | Status: DC | PRN
  Administered 2024-05-04 (×2): 20 mL via TOPICAL

## 2024-05-04 MED ORDER — PROPOFOL 10 MG/ML IV BOLUS
INTRAVENOUS | Status: AC
  Filled 2024-05-04: qty 20

## 2024-05-04 MED ORDER — DIPHENHYDRAMINE HCL 50 MG/ML IJ SOLN
INTRAMUSCULAR | Status: AC
  Filled 2024-05-04: qty 1

## 2024-05-04 MED ORDER — CHLORHEXIDINE GLUCONATE CLOTH 2 % EX PADS: 6.0000 | MEDICATED_PAD | Freq: Once | CUTANEOUS | Status: DC

## 2024-05-04 MED ORDER — EZETIMIBE 10 MG PO TABS
10.0000 mg | ORAL_TABLET | Freq: Every day | ORAL | Status: DC
  Administered 2024-05-05 (×2): 10 mg via ORAL
  Filled 2024-05-04: qty 1

## 2024-05-04 MED ORDER — HYDROMORPHONE HCL 1 MG/ML IJ SOLN
INTRAMUSCULAR | Status: DC | PRN
  Administered 2024-05-04 (×2): .5 mg via INTRAVENOUS

## 2024-05-04 MED ORDER — THROMBIN 5000 UNITS EX KIT
PACK | CUTANEOUS | Status: AC
Start: 2024-05-04 — End: 2024-05-04
  Filled 2024-05-04: qty 1

## 2024-05-04 MED ORDER — THROMBIN 5000 UNITS EX SOLR
OROMUCOSAL | Status: DC | PRN
  Administered 2024-05-04 (×2): 5 mL via TOPICAL

## 2024-05-04 MED ORDER — DEXAMETHASONE SODIUM PHOSPHATE 10 MG/ML IJ SOLN
6.0000 mg | Freq: Four times a day (QID) | INTRAMUSCULAR | Status: DC
  Administered 2024-05-04 – 2024-05-05 (×6): 6 mg via INTRAVENOUS
  Filled 2024-05-04 (×3): qty 1

## 2024-05-04 MED ORDER — HYDROMORPHONE HCL 1 MG/ML IJ SOLN
INTRAMUSCULAR | Status: AC
  Filled 2024-05-04: qty 0.5

## 2024-05-04 MED ORDER — HYDROMORPHONE HCL 1 MG/ML IJ SOLN
INTRAMUSCULAR | Status: AC
  Filled 2024-05-04: qty 1

## 2024-05-04 MED ORDER — HYDROMORPHONE HCL 1 MG/ML IJ SOLN
0.2500 mg | INTRAMUSCULAR | Status: DC | PRN
  Administered 2024-05-04 (×6): 0.5 mg via INTRAVENOUS

## 2024-05-04 MED ORDER — SOTALOL HCL 80 MG PO TABS
80.0000 mg | ORAL_TABLET | Freq: Two times a day (BID) | ORAL | Status: DC
  Administered 2024-05-04 – 2024-05-05 (×4): 80 mg via ORAL
  Filled 2024-05-04 (×2): qty 1

## 2024-05-04 MED ORDER — LIDOCAINE-EPINEPHRINE 1 %-1:100000 IJ SOLN
INTRAMUSCULAR | Status: DC | PRN
  Administered 2024-05-04 (×2): 10 mL

## 2024-05-04 MED ORDER — HYDROCODONE-ACETAMINOPHEN 5-325 MG PO TABS
1.0000 | ORAL_TABLET | ORAL | Status: DC | PRN
  Administered 2024-05-04 – 2024-05-05 (×8): 1 via ORAL
  Filled 2024-05-04 (×4): qty 1

## 2024-05-04 MED ORDER — CEFAZOLIN SODIUM-DEXTROSE 2-4 GM/100ML-% IV SOLN
2.0000 g | INTRAVENOUS | Status: AC
  Administered 2024-05-04 (×2): 2 g via INTRAVENOUS
  Filled 2024-05-04: qty 100

## 2024-05-04 MED ORDER — SENNA 8.6 MG PO TABS
1.0000 | ORAL_TABLET | Freq: Two times a day (BID) | ORAL | Status: DC
  Administered 2024-05-04 – 2024-05-05 (×4): 8.6 mg via ORAL
  Filled 2024-05-04 (×2): qty 1

## 2024-05-04 MED ORDER — ROCURONIUM BROMIDE 10 MG/ML (PF) SYRINGE
PREFILLED_SYRINGE | INTRAVENOUS | Status: AC
  Filled 2024-05-04: qty 10

## 2024-05-04 MED ORDER — ADULT MULTIVITAMIN W/MINERALS CH
1.0000 | ORAL_TABLET | Freq: Every day | ORAL | Status: DC
  Administered 2024-05-04 – 2024-05-05 (×4): 1 via ORAL
  Filled 2024-05-04 (×2): qty 1

## 2024-05-04 MED ORDER — ONDANSETRON HCL 4 MG/2ML IJ SOLN
INTRAMUSCULAR | Status: DC | PRN
  Administered 2024-05-04 (×2): 4 mg via INTRAVENOUS

## 2024-05-04 MED ORDER — DEXAMETHASONE SODIUM PHOSPHATE 4 MG/ML IJ SOLN: 4.0000 mg | Freq: Four times a day (QID) | INTRAMUSCULAR | Status: DC

## 2024-05-04 MED ORDER — FENTANYL CITRATE (PF) 100 MCG/2ML IJ SOLN
INTRAMUSCULAR | Status: AC
  Filled 2024-05-04: qty 2

## 2024-05-04 MED ORDER — CHLORHEXIDINE GLUCONATE CLOTH 2 % EX PADS
6.0000 | MEDICATED_PAD | Freq: Every day | CUTANEOUS | Status: DC
  Administered 2024-05-04 – 2024-05-05 (×4): 6 via TOPICAL

## 2024-05-04 MED ORDER — SUGAMMADEX SODIUM 200 MG/2ML IV SOLN
INTRAVENOUS | Status: DC | PRN
  Administered 2024-05-04 (×2): 200 mg via INTRAVENOUS

## 2024-05-04 MED ORDER — LIDOCAINE 2% (20 MG/ML) 5 ML SYRINGE
INTRAMUSCULAR | Status: DC | PRN
  Administered 2024-05-04 (×2): 80 mg via INTRAVENOUS

## 2024-05-04 MED ORDER — THROMBIN 20000 UNITS EX SOLR
CUTANEOUS | Status: AC
Start: 2024-05-04 — End: 2024-05-04
  Filled 2024-05-04: qty 20000

## 2024-05-04 MED ORDER — TRYPTOPHAN 500 MG PO CAPS: ORAL_CAPSULE | Freq: Every day | ORAL | Status: DC

## 2024-05-04 MED ORDER — VENLAFAXINE HCL ER 150 MG PO CP24
150.0000 mg | ORAL_CAPSULE | Freq: Every day | ORAL | Status: DC
  Administered 2024-05-05 (×2): 150 mg via ORAL
  Filled 2024-05-04: qty 1

## 2024-05-04 MED ORDER — PROMETHAZINE HCL 12.5 MG PO TABS: 12.5000 mg | ORAL_TABLET | ORAL | Status: DC | PRN

## 2024-05-04 MED ORDER — DEXAMETHASONE SODIUM PHOSPHATE 10 MG/ML IJ SOLN
INTRAMUSCULAR | Status: DC | PRN
  Administered 2024-05-04 (×2): 8 mg via INTRAVENOUS

## 2024-05-04 MED ORDER — MAGNESIUM OXIDE -MG SUPPLEMENT 400 (240 MG) MG PO TABS
200.0000 mg | ORAL_TABLET | Freq: Every day | ORAL | Status: DC
  Administered 2024-05-04 (×2): 200 mg via ORAL
  Filled 2024-05-04: qty 1

## 2024-05-04 MED ORDER — DEXAMETHASONE SODIUM PHOSPHATE 4 MG/ML IJ SOLN: 4.0000 mg | Freq: Three times a day (TID) | INTRAMUSCULAR | Status: DC

## 2024-05-04 MED ORDER — DROPERIDOL 2.5 MG/ML IJ SOLN: 0.6250 mg | Freq: Once | INTRAMUSCULAR | Status: DC | PRN

## 2024-05-04 MED ORDER — PROPOFOL 10 MG/ML IV BOLUS
INTRAVENOUS | Status: DC | PRN
  Administered 2024-05-04: 20 mg via INTRAVENOUS
  Administered 2024-05-04: 30 mg via INTRAVENOUS
  Administered 2024-05-04 (×2): 120 mg via INTRAVENOUS
  Administered 2024-05-04: 20 mg via INTRAVENOUS
  Administered 2024-05-04: 30 mg via INTRAVENOUS

## 2024-05-04 MED ORDER — 0.9 % SODIUM CHLORIDE (POUR BTL) OPTIME
TOPICAL | Status: DC | PRN
  Administered 2024-05-04 (×2): 2000 mL

## 2024-05-04 MED ORDER — MORPHINE SULFATE (PF) 2 MG/ML IV SOLN: 1.0000 mg | INTRAVENOUS | Status: DC | PRN

## 2024-05-04 MED ORDER — PHENYLEPHRINE HCL-NACL 20-0.9 MG/250ML-% IV SOLN
INTRAVENOUS | Status: DC | PRN
  Administered 2024-05-04 (×2): 25 ug/min via INTRAVENOUS

## 2024-05-04 MED ORDER — DIPHENHYDRAMINE HCL 50 MG/ML IJ SOLN
12.5000 mg | Freq: Once | INTRAMUSCULAR | Status: AC
  Administered 2024-05-04 (×2): 12.5 mg via INTRAVENOUS

## 2024-05-04 MED ORDER — LACTATED RINGERS IV SOLN: INTRAVENOUS | Status: DC

## 2024-05-04 MED ORDER — PREDNISONE 5 MG PO TABS: 5.0000 mg | ORAL_TABLET | Freq: Every day | ORAL | Status: DC

## 2024-05-04 MED ORDER — SODIUM CHLORIDE 0.9 % IV SOLN
0.1500 ug/kg/min | INTRAVENOUS | Status: DC
  Administered 2024-05-04 (×2): .15 ug/kg/min via INTRAVENOUS
  Filled 2024-05-04 (×2): qty 2000

## 2024-05-04 MED ORDER — ACETAMINOPHEN 650 MG RE SUPP: 650.0000 mg | RECTAL | Status: DC | PRN

## 2024-05-04 MED ORDER — BACITRACIN ZINC 500 UNIT/GM EX OINT
TOPICAL_OINTMENT | CUTANEOUS | Status: AC
  Filled 2024-05-04: qty 28.35

## 2024-05-04 MED ORDER — ROCURONIUM BROMIDE 10 MG/ML (PF) SYRINGE
PREFILLED_SYRINGE | INTRAVENOUS | Status: DC | PRN
  Administered 2024-05-04: 10 mg via INTRAVENOUS
  Administered 2024-05-04 (×2): 50 mg via INTRAVENOUS
  Administered 2024-05-04: 10 mg via INTRAVENOUS

## 2024-05-04 MED ORDER — ORAL CARE MOUTH RINSE
15.0000 mL | Freq: Once | OROMUCOSAL | Status: AC
Start: 2024-05-04 — End: 2024-05-04

## 2024-05-04 MED ORDER — ACETAMINOPHEN 325 MG PO TABS
650.0000 mg | ORAL_TABLET | ORAL | Status: DC | PRN
  Administered 2024-05-04 (×2): 650 mg via ORAL
  Filled 2024-05-04: qty 2

## 2024-05-04 MED ORDER — HYDRALAZINE HCL 20 MG/ML IJ SOLN
10.0000 mg | Freq: Four times a day (QID) | INTRAMUSCULAR | Status: DC | PRN
  Administered 2024-05-04 (×2): 10 mg via INTRAVENOUS
  Filled 2024-05-04: qty 1

## 2024-05-04 MED ORDER — POTASSIUM CHLORIDE IN NACL 20-0.9 MEQ/L-% IV SOLN
INTRAVENOUS | Status: DC
  Filled 2024-05-04 (×2): qty 1000

## 2024-05-04 MED ORDER — LIDOCAINE 2% (20 MG/ML) 5 ML SYRINGE
INTRAMUSCULAR | Status: AC
  Filled 2024-05-04: qty 5

## 2024-05-04 MED ORDER — ONDANSETRON HCL 4 MG/2ML IJ SOLN
INTRAMUSCULAR | Status: AC
  Filled 2024-05-04: qty 2

## 2024-05-04 MED ORDER — LABETALOL HCL 5 MG/ML IV SOLN
INTRAVENOUS | Status: DC | PRN
  Administered 2024-05-04 (×2): 5 mg via INTRAVENOUS

## 2024-05-04 MED ORDER — IRBESARTAN 300 MG PO TABS
300.0000 mg | ORAL_TABLET | Freq: Every day | ORAL | Status: DC
  Administered 2024-05-04 – 2024-05-05 (×4): 300 mg via ORAL
  Filled 2024-05-04 (×2): qty 2
  Filled 2024-05-04 (×2): qty 1

## 2024-05-04 MED ORDER — ONDANSETRON HCL 4 MG PO TABS
4.0000 mg | ORAL_TABLET | ORAL | Status: DC | PRN
Start: 2024-05-04 — End: 2024-05-05

## 2024-05-04 MED ORDER — FENTANYL CITRATE (PF) 250 MCG/5ML IJ SOLN
INTRAMUSCULAR | Status: DC | PRN
  Administered 2024-05-04 (×4): 50 ug via INTRAVENOUS

## 2024-05-04 MED ORDER — ONDANSETRON HCL 4 MG/2ML IJ SOLN
4.0000 mg | INTRAMUSCULAR | Status: DC | PRN
Start: 2024-05-04 — End: 2024-05-05

## 2024-05-04 MED ORDER — DEXAMETHASONE SODIUM PHOSPHATE 10 MG/ML IJ SOLN
INTRAMUSCULAR | Status: AC
  Filled 2024-05-04: qty 1

## 2024-05-04 MED ORDER — CHLORHEXIDINE GLUCONATE 0.12 % MT SOLN
15.0000 mL | Freq: Once | OROMUCOSAL | Status: AC
  Administered 2024-05-04 (×2): 15 mL via OROMUCOSAL
  Filled 2024-05-04: qty 15

## 2024-05-04 MED ORDER — ORAL CARE MOUTH RINSE
15.0000 mL | OROMUCOSAL | Status: DC | PRN
Start: 2024-05-04 — End: 2024-05-05

## 2024-05-04 SURGICAL SUPPLY — 50 items
BAG COUNTER SPONGE SURGICOUNT (BAG) ×1 IMPLANT
BUR SPIRAL ROUTER 2.3 (BUR) ×1 IMPLANT
CANISTER SUCTION 3000ML PPV (SUCTIONS) ×1 IMPLANT
CLIP TI MEDIUM 6 (CLIP) IMPLANT
DERMABOND ADVANCED .7 DNX12 (GAUZE/BANDAGES/DRESSINGS) IMPLANT
DRAPE MICROSCOPE SLANT 54X150 (MISCELLANEOUS) IMPLANT
DRAPE NEUROLOGICAL W/INCISE (DRAPES) ×1 IMPLANT
DRAPE SURG 17X23 STRL (DRAPES) IMPLANT
DRAPE WARM FLUID 44X44 (DRAPES) ×1 IMPLANT
DRSG OPSITE POSTOP 3X4 (GAUZE/BANDAGES/DRESSINGS) IMPLANT
DURAPREP 6ML APPLICATOR 50/CS (WOUND CARE) ×1 IMPLANT
ELECTRODE REM PT RTRN 9FT ADLT (ELECTROSURGICAL) ×1 IMPLANT
EVACUATOR 1/8 PVC DRAIN (DRAIN) IMPLANT
GAUZE 4X4 16PLY ~~LOC~~+RFID DBL (SPONGE) IMPLANT
GAUZE SPONGE 4X4 12PLY STRL (GAUZE/BANDAGES/DRESSINGS) ×1 IMPLANT
GLOVE BIO SURGEON STRL SZ7 (GLOVE) ×1 IMPLANT
GLOVE BIO SURGEON STRL SZ8 (GLOVE) ×1 IMPLANT
GLOVE BIOGEL PI IND STRL 7.0 (GLOVE) ×1 IMPLANT
GOWN STRL REUS W/ TWL LRG LVL3 (GOWN DISPOSABLE) ×1 IMPLANT
GOWN STRL REUS W/ TWL XL LVL3 (GOWN DISPOSABLE) ×1 IMPLANT
GOWN STRL REUS W/TWL 2XL LVL3 (GOWN DISPOSABLE) IMPLANT
GRAFT DURAGEN MATRIX 5WX7L (Graft) IMPLANT
HEMOSTAT POWDER KIT SURGIFOAM (HEMOSTASIS) ×1 IMPLANT
HEMOSTAT POWDER SURGIFOAM 1G (HEMOSTASIS) IMPLANT
KIT BASIN OR (CUSTOM PROCEDURE TRAY) ×1 IMPLANT
KIT TURNOVER KIT B (KITS) ×1 IMPLANT
NDL HYPO 22X1.5 SAFETY MO (MISCELLANEOUS) ×1 IMPLANT
NEEDLE HYPO 22X1.5 SAFETY MO (MISCELLANEOUS) ×1 IMPLANT
NS IRRIG 1000ML POUR BTL (IV SOLUTION) ×1 IMPLANT
PACK CRANIOTOMY CUSTOM (CUSTOM PROCEDURE TRAY) ×2 IMPLANT
PAD ARMBOARD POSITIONER FOAM (MISCELLANEOUS) ×1 IMPLANT
PADDING CAST ABS COTTON 4X4 ST (CAST SUPPLIES) IMPLANT
PATTIES SURGICAL .5 X3 (DISPOSABLE) IMPLANT
PERFORATOR LRG 14-11MM (BIT) ×1 IMPLANT
PIN MAYFIELD SKULL DISP (PIN) IMPLANT
SEALANT ADHERUS EXTEND TIP (MISCELLANEOUS) IMPLANT
SPONGE NEURO XRAY DETECT 1X3 (DISPOSABLE) IMPLANT
SPONGE SURGIFOAM ABS GEL 100 (HEMOSTASIS) ×1 IMPLANT
STAPLER SKIN PROX 35W (STAPLE) ×1 IMPLANT
SUT 3-0 BLK 1X30 PSL (SUTURE) IMPLANT
SUT NURALON 4 0 TR CR/8 (SUTURE) ×2 IMPLANT
SUT VIC AB 2-0 CP2 18 (SUTURE) ×1 IMPLANT
SYR CONTROL 10ML LL (SYRINGE) ×1 IMPLANT
TAPE CLOTH SURG 6X10 NS LF (GAUZE/BANDAGES/DRESSINGS) IMPLANT
TOWEL GREEN STERILE (TOWEL DISPOSABLE) ×1 IMPLANT
TOWEL GREEN STERILE FF (TOWEL DISPOSABLE) ×1 IMPLANT
TRAY FOLEY MTR SLVR 16FR STAT (SET/KITS/TRAYS/PACK) ×1 IMPLANT
TUBING FEATHERFLOW (TUBING) IMPLANT
UNDERPAD 30X36 HEAVY ABSORB (UNDERPADS AND DIAPERS) IMPLANT
WATER STERILE IRR 1000ML POUR (IV SOLUTION) ×1 IMPLANT

## 2024-05-04 NOTE — Anesthesia Procedure Notes (Signed)
 Procedure Name: Intubation Date/Time: 05/04/2024 2:02 PM  Performed by: Mannie Krystal LABOR, CRNAPre-anesthesia Checklist: Patient identified, Emergency Drugs available, Suction available and Patient being monitored Patient Re-evaluated:Patient Re-evaluated prior to induction Oxygen Delivery Method: Circle system utilized Preoxygenation: Pre-oxygenation with 100% oxygen Induction Type: IV induction Ventilation: Mask ventilation without difficulty Laryngoscope Size: Mac and 4 Grade View: Grade I Tube type: Oral Tube size: 7.0 mm Number of attempts: 1 Airway Equipment and Method: Stylet and Oral airway Placement Confirmation: ETT inserted through vocal cords under direct vision, positive ETCO2 and breath sounds checked- equal and bilateral Secured at: 22 cm Tube secured with: Tape Dental Injury: Teeth and Oropharynx as per pre-operative assessment

## 2024-05-04 NOTE — Op Note (Signed)
 05/04/2024  4:03 PM  PATIENT:  [name] [name]  78 y.o. 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 Dorn Ned, Suzen Pean, FNP  ANESTHESIA:   General  EBL: 100 ml  Total I/O In: 800 [I.V.:800] Out: 100 [Blood:100]  BLOOD ADMINISTERED: none  DRAINS: Hemovac  SPECIMEN:  none  INDICATION FOR PROCEDURE: This patient presented with swelling in the right frontal region. Imaging showed probable osteomyelitis of the right frontal bone flap with sinusitis.  She was seen by ENT and they referred to tertiary care center who recommended sinus surgery.  However she also needed craniectomy.  She was sent back here for that and she comes in today for planned craniectomy for osteomyelitis with exoneration of her right frontal sinus.  She is not on antibiotics. Patient understood the risks, benefits, and alternatives and potential outcomes and wished to proceed.  PROCEDURE DETAILS: The patient was taken the operating room and after induction of adequate general endotracheal anesthesia her head was placed in a doughnut.  I shaved this upon the hairline from the left frontal region across the right frontal region to the right temporal region, exposing the old incision.  This was then prepped with DuraPrep and draped in usual sterile fashion.  10 cc of local anesthesia was injected.  We then made a curvilinear incision from the left frontal region to the right temporal region.  We saved the paracranium in the left frontal region.  There is no paracranium in the right frontal region.  We exposed the old bone flap and removed the plates.  The screws did not have good purchase because of the osteomyelitis.  The plates and screws were removed.   The frontal bone was then removed.  Underlying dura looked good.  There is no evidence of purulence or infection.  We exposed the right frontal sinus through its small opening and exonerated the sinus with suction and irrigation and Bovie cautery.  We used a 15 blade scalpel to cut the paracranium in the left frontal region and left a pedicle at the left inferior frontal region and swept the paracranium over the hole in the sinus.  We made a separate incision in the belly and removed some fat and used this to pack the sinus.  This incision was then closed in layers of 2-0 Vicryl and 3-0 Vicryl and benzoin and Steri-Strips.  We covered the sinus and placed tack up sutures in the dura and graft in the frontal region and the supraorbital region.  The bone flap was sent for culture.  Placed medium Hemovac drain through separate stab incision and then closed the galea with interrupted 2-0 Vicryl.  Closed the skin with staples.  The drain was sewn into position.  I then placed antibiotic ointment and a dressing over the cranial incision.  The patient was then awakened from general anesthesia and transported to the recovery room in stable condition.  At the end of the procedure all sponge needle and instrument counts were correct   PLAN OF CARE: Admit to inpatient   PATIENT DISPOSITION:  PACU - hemodynamically stable.   Delay start of Pharmacological VTE agent (>24hrs) due to surgical blood loss or risk of bleeding:  yes

## 2024-05-04 NOTE — Consult Note (Signed)
 NAME:  CORRETTA MUNCE, MRN:  [id number], DOB:  02-23-1946, LOS: 0 ADMISSION DATE:  05/04/2024, CONSULTATION DATE:  05/04/24  REFERRING MD:  Kateleen Encarnacion CHIEF COMPLAINT:  Osteomyelitis of bone flap   History of Present Illness:  Patient is a 78 year old female with significant past medical history of right frontal meningioma status post right frontal craniotomy/resection on 02/05/23, former smoker, asthma, hypertension, diabetes type 2, polymyalgia rheumatica-on chronic prednisone , OSA status post hypoglossal nerve stimulator 12/18/2021, GERD, hepatitis, PAF-on warfarin, mild to moderate MR/moderate AR on echo 03/16/2024 who presents for scheduled right frontal craniectomy due to 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   Pertinent  Medical History   Past Medical History:  Diagnosis Date   Anemia    years ago after surgery   Atrial fibrillation (HCC)    Bronchitis    Dysrhythmia    A-fib   Elevated coronary artery calcium score 02/23/2024   FUO (fever of unknown origin) 03/03/2015   GERD (gastroesophageal reflux disease)    Heart murmur    Mild to moderate MR, moderate AR 03/16/24   Hepatitis 1980s   Hepatitis    non A- non B   HTN (hypertension)    Hypercholesterolemia    Night sweat 03/03/2015   PMR (polymyalgia rheumatica) (HCC) 03/15/2015   Polyarthritis 03/03/2015   Polymyalgia (HCC) 03/03/2015   Sleep apnea    Pt has Inspire Device     Significant Hospital Events: Including procedures, antibiotic start and stop dates in addition to other pertinent events   PCCM consult, NSG primary- 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 rate 15, height 5' 4 (1.626 m), weight 87.5 kg, SpO2 94%.        Intake/Output Summary (Last 24 hours) at 05/04/2024  1642 Last data filed at 05/04/2024 1600 Gross per 24 hour  Intake 900 ml  Output 100 ml  Net 800 ml   Filed Weights   05/04/24 1114  Weight: 87.5 kg    Examination: General: acute on chronic older adult female, status post op crani- NAD, lying in ICU bed  HENT: Normocephalic, PERRLA [id number] B/L, Pink intact  Lungs: clr, diminished in lower bases, NAD  Cardiovascular: s1,s2, RRR-sinus brady, No JVD, no MRG Abdomen: bs active  Extremities: moves all extremities on command Neuro: follows all commands, alert and oriented x 4  GU: foley intact   Resolved problem list   Assessment and Plan  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 s/p craniectomy  -exoneration of rt frontal sinus with coverage of paracranial graft- see OP note Right frontal meningioma status post right frontal craniectomy/resection on 02/05/23 P:  Post- op management per Neurosurgery, appreciate assistance  SBP goal <160  Multimodal pain management, with bowel regimen  Initiate Seizure and aspiration precautions  Neuro protective measures: euglycemia, euvolemia, normoxia, normocapnia, maintain electrolytes within normal limits-BMET daily   Continue ancef  for surgical prophylaxis  Dexamethasone  q 6hrs   Asthma  OSA-hypoglossal nerve stimulator (inspire) 12/18/2021 P: Albuterol  PRN Restart Singulair  on 8/13 Pulse ox continuous   PAF on warfarin  6/24 Echo- 60- 65% EF, no regional wall abnormalities, mild to moderate MR, moderate AR  HTN -on warfarin for AC -on solatol for afib  P:  Monitor on cardiac telemetry  Hold AC until approved by Neurosurg to restart  Restart irbesartan  and  chlorathiadone on 05/05/24 if renal function stable Restart solatol PRN hydralazine - SBP goal < 160   GERD  P: Protonix  PPI Takes omeprazole at home   Polymyalgia rheumatica  On chronic steroids- prednisone  5mg  tablet  P: Would hold prednisone  since on dexamethasone     Best Practice (right  click and Reselect all SmartList Selections daily)   Diet/type: NPO DVT prophylaxis SCD Pressure ulcer(s): N/A GI prophylaxis: PPI Lines: N/A Foley:  N/A Code Status:  full code Last date of multidisciplinary goals of care discussion - per primary   Labs   CBC: No results for input(s): WBC, NEUTROABS, HGB, HCT, MCV, PLT in the last 168 hours.  Basic Metabolic Panel: No results for input(s): NA, K, CL, CO2, GLUCOSE, BUN, CREATININE, CALCIUM, MG, PHOS in the last 168 hours. GFR: Estimated Creatinine Clearance: 42.5 mL/min (A) (by C-G formula based on SCr of 1.13 mg/dL (H)). No results for input(s): PROCALCITON, WBC, LATICACIDVEN in the last 168 hours.  Liver Function Tests: No results for input(s): AST, ALT, ALKPHOS, BILITOT, PROT, ALBUMIN in the last 168 hours. No results for input(s): LIPASE, AMYLASE in the last 168 hours. No results for input(s): AMMONIA in the last 168 hours.  ABG    Component Value Date/Time   TCO2 25 07/06/2008 0837     Coagulation Profile: Recent Labs  Lab 05/04/24 1101  INR 1.0    Cardiac Enzymes: No results for input(s): CKTOTAL, CKMB, CKMBINDEX, TROPONINI in the last 168 hours.  HbA1C: No results found for: HGBA1C  CBG: No results for input(s): GLUCAP in the last 168 hours.  Review of Systems:   See HPI   Past Medical History:  She,  has a past medical history of Anemia, Atrial fibrillation (HCC), Bronchitis, Dysrhythmia, Elevated coronary artery calcium score (02/23/2024), FUO (fever of unknown origin) (03/03/2015), GERD (gastroesophageal reflux disease), Heart murmur, Hepatitis (1980s), Hepatitis, HTN (hypertension), Hypercholesterolemia, Night sweat (03/03/2015), PMR (polymyalgia rheumatica) (HCC) (03/15/2015), Polyarthritis (03/03/2015), Polymyalgia (HCC) (03/03/2015), and Sleep apnea.   Surgical History:   Past Surgical History:  Procedure Laterality Date    ABDOMINAL HYSTERECTOMY  1973   APPENDECTOMY  09/24/1971   APPLICATION OF CRANIAL NAVIGATION Right 02/05/2023   Procedure: APPLICATION OF CRANIAL NAVIGATION;  Surgeon: Rodolphe Edmonston Alm RAMAN, MD;  Location: [facility] OR;  Service: Neurosurgery;  Laterality: Right;   CHOLECYSTECTOMY N/A 10/08/2022   Procedure: LAPAROSCOPIC CHOLECYSTECTOMY;  Surgeon: Belinda Cough, MD;  Location: [facility] OR;  Service: General;  Laterality: N/A;   CRANIOTOMY Right 02/05/2023   Procedure: Craniotomy - right for meningioma - Frontal;  Surgeon: Arneta Mahmood Alm RAMAN, MD;  Location[facility] OR;  Service: Neurosurgery;  Laterality: Right;   DRUG INDUCED ENDOSCOPY N/A 11/14/2021   Procedure: DRUG INDUCED SLEEP ENDOSCOPY;  Surgeon: Carlie Clark, MD;  Location: [facility];  Service: ENT;  Laterality: N/A;   FOOT SURGERY Left 09/24/2007   IMPLANTATION OF HYPOGLOSSAL NERVE STIMULATOR Right 12/18/2021   Procedure: IMPLANTATION OF HYPOGLOSSAL NERVE STIMULATOR;  Surgeon: Carlie Clark, MD;  Location: [facility];  Service: ENT;  Laterality: Right;   INTRAOPERATIVE CHOLANGIOGRAM N/A 10/08/2022   Procedure: INTRAOPERATIVE CHOLANGIOGRAM;  Surgeon: Belinda Cough, MD;  Location: [facility] OR;  Service: General;  Laterality: N/A;   KNEE ARTHROSCOPY  09/23/2005   right   OOPHORECTOMY Bilateral 2001   TUBAL LIGATION  09/23/1970     Social History:   reports that she quit smoking about 43 years ago. Her  smoking use included cigarettes. She started smoking about 48 years ago. She has a 1 pack-year smoking history. She has never used smokeless tobacco. She reports current alcohol  use of about 14.0 - 21.0 standard drinks of alcohol  per week. She reports that she does not use drugs.   Family History:  Her family history includes Allergies in her daughter, daughter, and son; Breast cancer in an other family member; Hypertension in her mother and sister; Lung cancer in her father; Multiple sclerosis in her daughter.   Allergies Allergies   Allergen Reactions   Atorvastatin Other (See Comments)    Muscle cramps severe      Simvastatin Other (See Comments)    Severe muscle cramps   Cephalosporins     'ran a fever   Nickel Rash   Sulfa Antibiotics Hives   Sulfasalazine Hives   Eliquis [Apixaban] Hives and Itching   Xarelto  [Rivaroxaban ] Hives and Itching     Home Medications  Prior to Admission medications   Medication Sig Start Date End Date Taking? Authorizing Provider  b complex vitamins capsule Take 1 capsule by mouth daily.   Yes [provider]  chlorthalidone  (HYGROTON ) 25 MG tablet Take 1 tablet (25 mg total) by mouth daily. 03/06/16  Yes [place], Peter M, MD  diphenhydrAMINE  (BENADRYL ) 25 MG tablet Take 25 mg by mouth at bedtime.   Yes [provider]  doxylamine, Sleep, (UNISOM) 25 MG tablet Take 25 mg by mouth at bedtime.   Yes [provider]  estradiol (VIVELLE-DOT) 0.025 MG/24HR Place 1 patch onto the skin 2 (two) times a week. 06/02/23  Yes [provider]  ezetimibe  (ZETIA ) 10 MG tablet TAKE 1 TABLET BY MOUTH EVERY DAY 07/24/22  Yes [place], Peter M, MD  fluticasone  (FLONASE ) 50 MCG/ACT nasal spray Place 2 sprays into both nostrils daily. 08/28/19  Yes [provider]  irbesartan  (AVAPRO ) 300 MG tablet Take 300 mg by mouth daily.   Yes [provider]  Magnesium  Oxide (MAG-OXIDE PO) Take 200 mg by mouth at bedtime.   Yes [provider]  montelukast  (SINGULAIR ) 10 MG tablet Take 10 mg by mouth at bedtime. 10/17/19  Yes [provider]  Multiple Vitamin (MULTI-VITAMINS) TABS Take 1 tablet by mouth daily.    Yes [provider]  Multiple Vitamins-Minerals (OCUVITE EYE HEALTH FORMULA) CAPS Take 1 tablet by mouth daily. 12/30/11  Yes [provider]  omeprazole (PRILOSEC) 20 MG capsule Take 20 mg by mouth daily.   Yes [provider]  OVER THE COUNTER MEDICATION Take 2 tablets by mouth at bedtime. Restful monk sleep  supplement (without melatonin)   Yes [provider]  predniSONE  (DELTASONE ) 5 MG tablet Take 5 mg by mouth daily with breakfast.   Yes [provider]  REPATHA SURECLICK 140 MG/ML SOAJ Inject 140 mg into the skin every 14 (fourteen) days. 04/10/24  Yes [provider]  sotalol  (BETAPACE ) 80 MG tablet Take 80 mg by mouth 2 (two) times daily.   Yes [provider]  traZODone (DESYREL) 50 MG tablet Take 50 mg by mouth at bedtime. 01/10/23  Yes [provider]  TRYPTOPHAN  PO Take 1 capsule by mouth at bedtime.   Yes [provider]  venlafaxine  XR (EFFEXOR -XR) 150 MG 24 hr capsule Take 150 mg by mouth daily.   Yes [provider]  Vitamin D, Ergocalciferol, (DRISDOL) 1.25 MG (50000 UNIT) CAPS capsule Take 50,000 Units by mouth every Monday. 10/21/22  Yes [provider]  warfarin (COUMADIN ) 5 MG tablet TAKE 1 TABLET BY MOUTH IN THE AFTERNOON OR AS DIRECTED BY COUMADIN  CLINIC 03/05/24  Yes Cindie Ole DASEN, MD  albuterol  (PROAIR  HFA) 108 (90 Base) MCG/ACT inhaler Inhale 2 puffs into the lungs every 6 (six) hours as needed. 09/14/18   Jude Harden GAILS, MD     Critical care time: 50 mins     Christian Claudene CANNON   [facility] 05/04/2024, 5:12 PM  Please see [email or web address] for pager details.  From 7A-7P if no response, please call [phone]. After hours, please call ELink [phone].

## 2024-05-04 NOTE — Progress Notes (Addendum)
 Patient arrived from PACU alert x4 on L [place]. Rash noted on neck chin and back of upper arms and back near arms per PACU RN rash decreased

## 2024-05-04 NOTE — H&P (Signed)
 Subjective: Patient is a 78 y.o. female admitted for infected bone flap via frontal sinus after sinusitis. Onset of symptoms was a few months ago, unchanged since that time.  The pain is rated mild, and is located at the R frontal region. The pain is described as aching and occurs intermittently. The symptoms have been progressive. Symptoms are exacerbated by nothing in particular. MRI or CT showed infected bone flap. Has seen ENT at [facility].  Past Medical History:  Diagnosis Date   Anemia    years ago after surgery   Atrial fibrillation (HCC)    Bronchitis    Dysrhythmia    A-fib   Elevated coronary artery calcium score 02/23/2024   FUO (fever of unknown origin) 03/03/2015   GERD (gastroesophageal reflux disease)    Heart murmur    Mild to moderate MR, moderate AR 03/16/24   Hepatitis 1980s   Hepatitis    non A- non B   HTN (hypertension)    Hypercholesterolemia    Night sweat 03/03/2015   PMR (polymyalgia rheumatica) (HCC) 03/15/2015   Polyarthritis 03/03/2015   Polymyalgia (HCC) 03/03/2015   Sleep apnea    Pt has Inspire Device    Past Surgical History:  Procedure Laterality Date   ABDOMINAL HYSTERECTOMY  1973   APPENDECTOMY  09/24/1971   APPLICATION OF CRANIAL NAVIGATION Right 02/05/2023   Procedure: APPLICATION OF CRANIAL NAVIGATION;  Surgeon: Joshua Alm RAMAN, MD;  Location: [facility] OR;  Service: Neurosurgery;  Laterality: Right;   CHOLECYSTECTOMY N/A 10/08/2022   Procedure: LAPAROSCOPIC CHOLECYSTECTOMY;  Surgeon: Belinda Cough, MD;  Location: [facility] OR;  Service: General;  Laterality: N/A;   CRANIOTOMY Right 02/05/2023   Procedure: Craniotomy - right for meningioma - Frontal;  Surgeon: Joshua Alm RAMAN, MD;  Location: [facility] OR;  Service: Neurosurgery;  Laterality: Right;   DRUG INDUCED ENDOSCOPY N/A 11/14/2021   Procedure: DRUG INDUCED SLEEP ENDOSCOPY;  Surgeon: Carlie Clark, MD;  Location: [facility];  Service: ENT;  Laterality: N/A;   FOOT SURGERY Left 09/24/2007    IMPLANTATION OF HYPOGLOSSAL NERVE STIMULATOR Right 12/18/2021   Procedure: IMPLANTATION OF HYPOGLOSSAL NERVE STIMULATOR;  Surgeon: Carlie Clark, MD;  Location: [facility];  Service: ENT;  Laterality: Right;   INTRAOPERATIVE CHOLANGIOGRAM N/A 10/08/2022   Procedure: INTRAOPERATIVE CHOLANGIOGRAM;  Surgeon: Belinda Cough, MD;  Location: [facility] OR;  Service: General;  Laterality: N/A;   KNEE ARTHROSCOPY  09/23/2005   right   OOPHORECTOMY Bilateral 2001   TUBAL LIGATION  09/23/1970    Prior to Admission medications   Medication Sig Start Date End Date Taking? Authorizing Provider  b complex vitamins capsule Take 1 capsule by mouth daily.   Yes [provider]  chlorthalidone  (HYGROTON ) 25 MG tablet Take 1 tablet (25 mg total) by mouth daily. 03/06/16  Yes [place], Peter M, MD  diphenhydrAMINE  (BENADRYL ) 25 MG tablet Take 25 mg by mouth at bedtime.   Yes [provider]  doxylamine, Sleep, (UNISOM) 25 MG tablet Take 25 mg by mouth at bedtime.   Yes [provider]  estradiol (VIVELLE-DOT) 0.025 MG/24HR Place 1 patch onto the skin 2 (two) times a week. 06/02/23  Yes [provider]  ezetimibe  (ZETIA ) 10 MG tablet TAKE 1 TABLET BY MOUTH EVERY DAY 07/24/22  Yes [place], Peter M, MD  fluticasone  (FLONASE ) 50 MCG/ACT nasal spray Place 2 sprays into both nostrils daily. 08/28/19  Yes [provider]  irbesartan  (AVAPRO ) 300 MG tablet Take 300 mg by mouth daily.  Yes [provider]  Magnesium  Oxide (MAG-OXIDE PO) Take 200 mg by mouth at bedtime.   Yes [provider]  montelukast  (SINGULAIR ) 10 MG tablet Take 10 mg by mouth at bedtime. 10/17/19  Yes [provider]  Multiple Vitamin (MULTI-VITAMINS) TABS Take 1 tablet by mouth daily.    Yes [provider]  Multiple Vitamins-Minerals (OCUVITE EYE HEALTH FORMULA) CAPS Take 1 tablet by mouth daily. 12/30/11  Yes [provider]  omeprazole (PRILOSEC) 20 MG capsule  Take 20 mg by mouth daily.   Yes [provider]  OVER THE COUNTER MEDICATION Take 2 tablets by mouth at bedtime. Restful monk sleep supplement (without melatonin)   Yes [provider]  predniSONE  (DELTASONE ) 5 MG tablet Take 5 mg by mouth daily with breakfast.   Yes [provider]  REPATHA SURECLICK 140 MG/ML SOAJ Inject 140 mg into the skin every 14 (fourteen) days. 04/10/24  Yes [provider]  sotalol  (BETAPACE ) 80 MG tablet Take 80 mg by mouth 2 (two) times daily.   Yes [provider]  traZODone (DESYREL) 50 MG tablet Take 50 mg by mouth at bedtime. 01/10/23  Yes [provider]  TRYPTOPHAN  PO Take 1 capsule by mouth at bedtime.   Yes [provider]  venlafaxine  XR (EFFEXOR -XR) 150 MG 24 hr capsule Take 150 mg by mouth daily.   Yes [provider]  Vitamin D, Ergocalciferol, (DRISDOL) 1.25 MG (50000 UNIT) CAPS capsule Take 50,000 Units by mouth every Monday. 10/21/22  Yes [provider]  warfarin (COUMADIN ) 5 MG tablet TAKE 1 TABLET BY MOUTH IN THE AFTERNOON OR AS DIRECTED BY COUMADIN  CLINIC 03/05/24  Yes Cindie Ole DASEN, MD  albuterol  (PROAIR  HFA) 108 (90 Base) MCG/ACT inhaler Inhale 2 puffs into the lungs every 6 (six) hours as needed. 09/14/18   Jude Harden GAILS, MD   Allergies  Allergen Reactions   Atorvastatin Other (See Comments)    Muscle cramps severe      Simvastatin Other (See Comments)    Severe muscle cramps   Cephalosporins     'ran a fever   Nickel Rash   Sulfa Antibiotics Hives   Sulfasalazine Hives   Eliquis [Apixaban] Hives and Itching   Xarelto  [Rivaroxaban ] Hives and Itching    Social History   Tobacco Use   Smoking status: Former    Current packs/day: 0.00    Average packs/day: 0.2 packs/day for 5.0 years (1.0 ttl pk-yrs)    Types: Cigarettes    Start date: 09/24/1975    Quit date: 09/23/1980    Years since quitting: 43.6   Smokeless tobacco: Never  Substance Use Topics    Alcohol  use: Yes    Alcohol /week: 14.0 - 21.0 standard drinks of alcohol     Types: 14 - 21 Glasses of wine per week    Family History  Problem Relation Age of Onset   Hypertension Sister    Lung cancer Father    Hypertension Mother    Multiple sclerosis Daughter    Breast cancer Other        maternal aunt   Allergies Daughter    Allergies Daughter    Allergies Son      Review of Systems  Positive ROS: neg  All other systems have been reviewed and were otherwise negative with the exception of those mentioned in the HPI and as above.  Objective: Vital signs in last 24 hours: Temp:  [98.1 F (36.7 C)] 98.1 F (36.7 C) (08/12  1114) Pulse Rate:  [60] 60 (08/12 1114) Resp:  [18] 18 (08/12 1114) BP: (173-194)/(53-56) 173/53 (08/12 1204) SpO2:  [98 %] 98 % (08/12 1114) Weight:  [87.5 kg] 87.5 kg (08/12 1114)  General Appearance: Alert, cooperative, no distress, appears stated age Head: Normocephalic, without obvious abnormality, atraumatic Eyes: PERRL, conjunctiva/corneas clear, EOM's intact    Neck: Supple, symmetrical, trachea midline Back: Symmetric, no curvature, ROM normal, no CVA tenderness Lungs:  respirations unlabored Heart: Regular rate and rhythm Abdomen: Soft, non-tender Extremities: Extremities normal, atraumatic, no cyanosis or edema Pulses: 2+ and symmetric all extremities Skin: Skin color, texture, turgor normal, no rashes or lesions  NEUROLOGIC:   Mental status: Alert and oriented x4,  no aphasia, good attention span, fund of knowledge, and memory Motor Exam - grossly normal Sensory Exam - grossly normal Reflexes: 1+ Coordination - grossly normal Gait - grossly normal Balance - grossly normal Cranial Nerves: I: smell Not tested  II: visual acuity  OS: nl    OD: nl  II: visual fields Full to confrontation  II: pupils Equal, round, reactive to light  III,VII: ptosis None  III,IV,VI: extraocular muscles  Full ROM  V: mastication Normal  V: facial  light touch sensation  Normal  V,VII: corneal reflex  Present  VII: facial muscle function - upper  Normal  VII: facial muscle function - lower Normal  VIII: hearing Not tested  IX: soft palate elevation  Normal  IX,X: gag reflex Present  XI: trapezius strength  5/5  XI: sternocleidomastoid strength 5/5  XI: neck flexion strength  5/5  XII: tongue strength  Normal    Data Review Lab Results  Component Value Date   WBC 10.4 04/27/2024   HGB 13.7 04/27/2024   HCT 41.7 04/27/2024   MCV 98.8 04/27/2024   PLT 200 04/27/2024   Lab Results  Component Value Date   NA 134 (L) 04/27/2024   K 3.8 04/27/2024   CL 99 04/27/2024   CO2 22 04/27/2024   BUN 30 (H) 04/27/2024   CREATININE 1.13 (H) 04/27/2024   GLUCOSE 169 (H) 04/27/2024   Lab Results  Component Value Date   INR 1.0 05/04/2024    Assessment/Plan:  Estimated body mass index is 33.13 kg/m as calculated from the following:   Height as of this encounter: 5' 4 (1.626 m).   Weight as of this encounter: 87.5 kg. Patient admitted for R frontal craniectomy for infected bone flap. Patient has failed a reasonable attempt at conservative therapy.  I explained the condition and procedure to the patient and answered any questions.  Patient wishes to proceed with procedure as planned. Understands risks/ benefits and typical outcomes of procedure.   Alm GORMAN Molt 05/04/2024 12:46 PM

## 2024-05-04 NOTE — Transfer of Care (Signed)
 Immediate Anesthesia Transfer of Care Note  Patient: [name] [name]  Procedure(s) Performed: REMOVAL OF CRANIOTOMY BONE FLAP AND ABDOMINAL FAT GRAFT (Right)  Patient Location: PACU  Anesthesia Type:General  Level of Consciousness: awake, alert , and oriented  Airway & Oxygen Therapy: Patient Spontanous Breathing  Post-op Assessment: Report given to RN and Post -op Vital signs reviewed and stable  Post vital signs: Reviewed and stable  Last Vitals:  Vitals Value Taken Time  BP 182/71 05/04/24 15:45  Temp 36.8 C 05/04/24 15:45  Pulse 74 05/04/24 15:48  Resp 10 05/04/24 15:48  SpO2 90 % 05/04/24 15:48  Vitals shown include unfiled device data.  Last Pain:  Vitals:   05/04/24 1204  TempSrc:   PainSc: 0-No pain         Complications: No notable events documented.

## 2024-05-04 NOTE — Progress Notes (Signed)
 PHARMACIST - PHYSICIAN ORDER COMMUNICATION  CONCERNING: P&T Medication Policy on Herbal Medications  DESCRIPTION:  This patient's order for:  tryptophan   has been noted.  This product(s) is classified as an "herbal" or natural product. Due to a lack of definitive safety studies or FDA approval, nonstandard manufacturing practices, plus the potential risk of unknown drug-drug interactions while on inpatient medications, the Pharmacy and Therapeutics Committee does not permit the use of "herbal" or natural products of this type within [facility].   ACTION TAKEN: The pharmacy department is unable to verify this order at this time and your patient has been informed of this safety policy. Please reevaluate patient's clinical condition at discharge and address if the herbal or natural product(s) should be resumed at that time.   Vito Ralph, PharmD, BCPS Please see amion for complete clinical pharmacist phone list 05/04/2024 6:19 PM

## 2024-05-05 ENCOUNTER — Encounter (HOSPITAL_COMMUNITY): Payer: Self-pay | Admitting: Neurological Surgery

## 2024-05-05 DIAGNOSIS — M353 Polymyalgia rheumatica: Secondary | ICD-10-CM

## 2024-05-05 DIAGNOSIS — G4733 Obstructive sleep apnea (adult) (pediatric): Secondary | ICD-10-CM | POA: Diagnosis not present

## 2024-05-05 DIAGNOSIS — I10 Essential (primary) hypertension: Secondary | ICD-10-CM

## 2024-05-05 DIAGNOSIS — Z9889 Other specified postprocedural states: Secondary | ICD-10-CM | POA: Diagnosis not present

## 2024-05-05 DIAGNOSIS — I480 Paroxysmal atrial fibrillation: Secondary | ICD-10-CM | POA: Diagnosis not present

## 2024-05-05 LAB — CBC
HCT: 35.6 % — ABNORMAL LOW (ref 36.0–46.0)
Hemoglobin: 11.9 g/dL — ABNORMAL LOW (ref 12.0–15.0)
MCH: 32.8 pg (ref 26.0–34.0)
MCHC: 33.4 g/dL (ref 30.0–36.0)
MCV: 98.1 fL (ref 80.0–100.0)
Platelets: 142 K/uL — ABNORMAL LOW (ref 150–400)
RBC: 3.63 MIL/uL — ABNORMAL LOW (ref 3.87–5.11)
RDW: 13.9 % (ref 11.5–15.5)
WBC: 9.5 K/uL (ref 4.0–10.5)
nRBC: 0 % (ref 0.0–0.2)

## 2024-05-05 LAB — BASIC METABOLIC PANEL WITH GFR
Anion gap: 10 (ref 5–15)
BUN: 27 mg/dL — ABNORMAL HIGH (ref 8–23)
CO2: 24 mmol/L (ref 22–32)
Calcium: 8.4 mg/dL — ABNORMAL LOW (ref 8.9–10.3)
Chloride: 100 mmol/L (ref 98–111)
Creatinine, Ser: 1.03 mg/dL — ABNORMAL HIGH (ref 0.44–1.00)
GFR, Estimated: 55 mL/min — ABNORMAL LOW (ref >60)
Glucose, Bld: 173 mg/dL — ABNORMAL HIGH (ref 70–99)
Potassium: 4.3 mmol/L (ref 3.5–5.1)
Sodium: 134 mmol/L — ABNORMAL LOW (ref 135–145)

## 2024-05-05 LAB — MAGNESIUM: Magnesium: 1.9 mg/dL (ref 1.7–2.4)

## 2024-05-05 LAB — PHOSPHORUS: Phosphorus: 4 mg/dL (ref 2.5–4.6)

## 2024-05-05 MED ORDER — PANTOPRAZOLE SODIUM 40 MG PO TBEC: 40.0000 mg | DELAYED_RELEASE_TABLET | Freq: Every day | ORAL | Status: DC

## 2024-05-05 MED ORDER — HYDROCODONE-ACETAMINOPHEN 5-325 MG PO TABS: 1.0000 | ORAL_TABLET | ORAL | 0 refills | Status: DC | PRN

## 2024-05-05 NOTE — Anesthesia Postprocedure Evaluation (Signed)
 Anesthesia Post Note  Patient: [name] [name]  Procedure(s) Performed: REMOVAL OF CRANIOTOMY BONE FLAP AND ABDOMINAL FAT GRAFT (Right)     Patient location during evaluation: PACU Anesthesia Type: General Level of consciousness: sedated and patient cooperative Pain management: pain level controlled Vital Signs Assessment: post-procedure vital signs reviewed and stable Respiratory status: spontaneous breathing Cardiovascular status: stable Anesthetic complications: no   No notable events documented.  Last Vitals:  Vitals:   05/05/24 0700 05/05/24 0800  BP: (!) 127/58 (!) 129/58  Pulse: 62 66  Resp: 14 18  Temp:  36.4 C  SpO2: 94% 95%    Last Pain:  Vitals:   05/05/24 0928  TempSrc:   PainSc: 4                  Norleen Pope

## 2024-05-05 NOTE — Discharge Summary (Signed)
 Patient ID: [name] [name] MRN: [id number] DOB/AGE: 1946-07-03 78 y.o.  Admit date: 05/04/2024 Discharge date: 05/05/2024  Admission Diagnoses: Infection of bone flap, initial encounter (HCC) [U15.7XXA] Status post craniotomy [Z98.890] S/P craniotomy [Z98.890]   Discharge Diagnoses: Same   Discharged Condition: Stable  Hospital Course:  [name] [name] is a 78 y.o. female who was admitted following a Right frontal craniectomy with exoneration of the right frontal sinus and coverage with a paracranial graft . They were recovered in PACU and transferred to the ICU. Hospital course was uncomplicated. Pt stable for discharge today as she is tolerating a normal diet and pain is well controlled. Pt to f/u in office for routine post op visit. Pt is in agreement w/ plan.    Discharge Exam: Blood pressure (!) 129/58, pulse 66, temperature 97.8 F (36.6 C), temperature source Oral, resp. rate 18, height 5' 4 (1.626 m), weight 90.5 kg, SpO2 95%. A&O Speech fluent, appropriate CN grossly intact Strength grossly intact BUE/BLE.  SILTx4.  Dressing c/d/I.  Cranial drain removed at beside without acute complication  Disposition: Discharge disposition: 01-Home or Self Care       Discharge Instructions     Leave dressing on - Keep it clean, dry, and intact until clinic visit   Complete by: As directed       Allergies as of 05/05/2024       Reactions   Atorvastatin Other (See Comments)   Muscle cramps severe   Simvastatin Other (See Comments)   Severe muscle cramps   Cephalosporins    'ran a fever   Nickel Rash   Sulfa Antibiotics Hives   Sulfasalazine Hives   Eliquis [apixaban] Hives, Itching   Xarelto  [rivaroxaban ] Hives, Itching        Medication List     PAUSE taking these medications    warfarin 5 MG tablet Wait to take this until: May 11, 2024 Commonly known as: COUMADIN  Take as directed. If you are unsure how to take this medication, talk to your nurse  or doctor. Original instructions: TAKE 1 TABLET BY MOUTH IN THE AFTERNOON OR AS DIRECTED BY COUMADIN  CLINIC       TAKE these medications    albuterol  108 (90 Base) MCG/ACT inhaler Commonly known as: ProAir  HFA Inhale 2 puffs into the lungs every 6 (six) hours as needed.   b complex vitamins capsule Take 1 capsule by mouth daily.   chlorthalidone  25 MG tablet Commonly known as: HYGROTON  Take 1 tablet (25 mg total) by mouth daily.   diphenhydrAMINE  25 MG tablet Commonly known as: BENADRYL  Take 25 mg by mouth at bedtime.   doxylamine (Sleep) 25 MG tablet Commonly known as: UNISOM Take 25 mg by mouth at bedtime.   estradiol 0.025 MG/24HR Commonly known as: VIVELLE-DOT Place 1 patch onto the skin 2 (two) times a week.   ezetimibe  10 MG tablet Commonly known as: ZETIA  TAKE 1 TABLET BY MOUTH EVERY DAY   fluticasone  50 MCG/ACT nasal spray Commonly known as: FLONASE  Place 2 sprays into both nostrils daily.   HYDROcodone -acetaminophen  5-325 MG tablet Commonly known as: NORCO/VICODIN Take 1 tablet by mouth every 4 (four) hours as needed for moderate pain (pain score 4-6).   irbesartan  300 MG tablet Commonly known as: AVAPRO  Take 300 mg by mouth daily.   MAG-OXIDE PO Take 200 mg by mouth at bedtime.   montelukast  10 MG tablet Commonly known as: SINGULAIR  Take 10 mg by mouth at bedtime.   Multi-Vitamins Tabs Take 1  tablet by mouth daily.   Ocuvite Eye Health Formula Caps Take 1 tablet by mouth daily.   omeprazole 20 MG capsule Commonly known as: PRILOSEC Take 20 mg by mouth daily.   OVER THE COUNTER MEDICATION Take 2 tablets by mouth at bedtime. Restful monk sleep supplement (without melatonin)   predniSONE  5 MG tablet Commonly known as: DELTASONE  Take 5 mg by mouth daily with breakfast.   Repatha SureClick 140 MG/ML Soaj Generic drug: Evolocumab Inject 140 mg into the skin every 14 (fourteen) days.   sotalol  80 MG tablet Commonly known as:  BETAPACE  Take 80 mg by mouth 2 (two) times daily.   traZODone 50 MG tablet Commonly known as: DESYREL Take 50 mg by mouth at bedtime.   TRYPTOPHAN  PO Take 1 capsule by mouth at bedtime.   venlafaxine  XR 150 MG 24 hr capsule Commonly known as: EFFEXOR -XR Take 150 mg by mouth daily.   Vitamin D (Ergocalciferol) 1.25 MG (50000 UNIT) Caps capsule Commonly known as: DRISDOL Take 50,000 Units by mouth every Monday.               Discharge Care Instructions  (From admission, onward)           Start     Ordered   05/05/24 0000  Leave dressing on - Keep it clean, dry, and intact until clinic visit        05/05/24 0935             Signed: CAMIE CAYLIN Jamarious Febo 05/05/2024, 9:36 AM

## 2024-05-05 NOTE — Progress Notes (Signed)
 Pt being d/c.  PIVs removed Discharge information given to pt and family at bedside Pt verb understanding and had no further questions.  Volunteer services took pt down to her vehicle.

## 2024-05-05 NOTE — Evaluation (Signed)
 Physical Therapy Evaluation and DISCHARGE  Patient Details Name: [name] [name] MRN: [id number] DOB: 17-Sep-1946 Today's Date: 05/05/2024  History of Present Illness  [name] [name] is a 78 y.o. 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 Pt in good spirits, reports feeling great and is functioning at contact guard level. Pt amb without AD and is able to negotiate full flight of stairs with L handrail. Pt with good home set up and support. Pt with no further acute PT needs at this time. PT SIGNING OFF. Pt safe to d/c home with family. Pt will not need follow up PT once home. Please re-consult if needed in future.        If plan is discharge home, recommend the following: A little help with walking and/or transfers;A little help with bathing/dressing/bathroom;Assist for transportation   Can travel by private vehicle        Equipment Recommendations None recommended by PT  Recommendations for Other Services       Functional Status Assessment Patient has had a recent decline in their functional status and demonstrates the ability to make significant improvements in function in a reasonable and predictable amount of time.     Precautions / Restrictions Precautions Precautions: Other (comment) Precaution/Restrictions Comments: R frontal craniectomy Restrictions Weight Bearing Restrictions Per Provider Order: No      Mobility  Bed Mobility Overal bed mobility: Modified Independent             General bed mobility comments: HOB slightly elevated    Transfers Overall transfer level: Needs assistance Equipment used:  None Transfers: Sit to/from Stand Sit to Stand: Contact guard assist           General transfer comment: contact guard due to not getting up OOB for 24 hours    Ambulation/Gait Ambulation/Gait assistance: Contact guard assist Gait Distance (Feet): 200 Feet Assistive device: None Gait Pattern/deviations: Step-through pattern, Decreased stride length, Wide base of support Gait velocity: wfl Gait velocity interpretation: 1.31 - 2.62 ft/sec, indicative of limited community ambulator   General Gait Details: pt guarded with bilat UEs but steady, reports I haven't really walked in a couple of days, not LOB  Stairs Stairs: Yes Stairs assistance: Contact guard assist Stair Management: One rail Left, Alternating pattern, Forwards Number of Stairs: 12 General stair comments: ascended reciprocally, descended side ways in step to pattern leading with L LE  Wheelchair Mobility     Tilt Bed    Modified Rankin (Stroke Patients Only)       Balance Overall balance assessment: Mild deficits observed, not formally tested                                           Pertinent Vitals/Pain Pain Assessment Pain Assessment: 0-10 Pain Score: 2  Pain Location: forehead soreness at incision    Home Living Family/patient expects to be discharged to:: Private residence Living Arrangements: Spouse/significant other;Children Available Help at Discharge: Family;Available 24 hours/day Type of Home: House Home Access: Stairs to enter Entrance Stairs-Rails: Left Entrance Stairs-Number of Steps: 3 Alternate Level  Stairs-Number of Steps: 2 flights of 6 steps with bilat HR but also has chair lifts on both sets Home Layout: Two level Home Equipment: Rolling Walker (2 wheels);Shower seat (chair lift for stairs)      Prior Function Prior Level of Function : Independent/Modified Independent;Driving             Mobility Comments: indep, no AD ADLs Comments: indep, does  grocery shopping     Extremity/Trunk Assessment   Upper Extremity Assessment Upper Extremity Assessment: Overall WFL for tasks assessed    Lower Extremity Assessment Lower Extremity Assessment: Generalized weakness    Cervical / Trunk Assessment Cervical / Trunk Assessment: Normal  Communication   Communication Communication: No apparent difficulties    Cognition Arousal: Alert Behavior During Therapy: WFL for tasks assessed/performed   PT - Cognitive impairments: No apparent impairments                         Following commands: Intact       Cueing Cueing Techniques: Verbal cues     General Comments General comments (skin integrity, edema, etc.): VSS, noted mild SOB with activity, pt reports this to be normal    Exercises     Assessment/Plan    PT Assessment Patient does not need any further PT services  PT Problem List         PT Treatment Interventions      PT Goals (Current goals can be found in the Care Plan section)  Acute Rehab PT Goals Patient Stated Goal: home ASAP PT Goal Formulation: All assessment and education complete, DC therapy    Frequency       Co-evaluation               AM-PAC PT 6 Clicks Mobility  Outcome Measure Help needed turning from your back to your side while in a flat bed without using bedrails?: None Help needed moving from lying on your back to sitting on the side of a flat bed without using bedrails?: None Help needed moving to and from a bed to a chair (including a wheelchair)?: None Help needed standing up from a chair using your arms (e.g., wheelchair or bedside chair)?: A Little Help needed to walk in hospital room?: A Little Help needed climbing 3-5 steps with a railing? : A Little 6 Click Score: 21    End of Session   Activity Tolerance: Patient tolerated treatment well Patient left: in chair;with call bell/phone within reach;with family/visitor present Nurse Communication: Mobility  status PT Visit Diagnosis: Unsteadiness on feet (R26.81)    Time: 1000-1021 PT Time Calculation (min) (ACUTE ONLY): 21 min   Charges:   PT Evaluation $PT Eval Low Complexity: 1 Low   PT General Charges $$ ACUTE PT VISIT: 1 Visit         Norene Ames, PT, DPT Acute Rehabilitation Services Secure chat preferred Office #: [phone]   Norene CHRISTELLA Ames 05/05/2024, 11:14 AM

## 2024-05-05 NOTE — Progress Notes (Signed)
 NAME:  [name] [name], MRN:  [id number], DOB:  01/04/1946, LOS: 1 ADMISSION DATE:  05/04/2024, CONSULTATION DATE:  05/04/24  REFERRING MD:  Gilma Bessette CHIEF COMPLAINT:  Osteomyelitis of bone flap   History of Present Illness:  Patient is a 78 year old female with significant past medical history of right frontal meningioma status post right frontal craniotomy/resection on 02/05/23, former smoker, asthma, hypertension, diabetes type 2, polymyalgia rheumatica-on chronic prednisone , OSA status post hypoglossal nerve stimulator 12/18/2021, GERD, hepatitis, PAF-on warfarin, mild to moderate MR/moderate AR on echo 03/16/2024 who presents for scheduled right frontal craniectomy due to 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   Pertinent  Medical History   Past Medical History:  Diagnosis Date   Anemia    years ago after surgery   Atrial fibrillation (HCC)    Bronchitis    Dysrhythmia    A-fib   Elevated coronary artery calcium score 02/23/2024   FUO (fever of unknown origin) 03/03/2015   GERD (gastroesophageal reflux disease)    Heart murmur    Mild to moderate MR, moderate AR 03/16/24   Hepatitis 1980s   Hepatitis    non A- non B   HTN (hypertension)    Hypercholesterolemia    Night sweat 03/03/2015   PMR (polymyalgia rheumatica) (HCC) 03/15/2015   Polyarthritis 03/03/2015   Polymyalgia (HCC) 03/03/2015   Sleep apnea    Pt has Inspire Device     Significant Hospital Events: Including procedures, antibiotic start and stop dates in addition to other pertinent events   8/12 PCCM consult, NSG primary- osteomyelitis of bone flap secondary to sinusitis s/p craniectomy  8/13 Patient stable, surgical pain improving, no current ICU needs   Interim History / Subjective:  Patient stable  Surgical pain improving No issues overnight   Objective    Blood pressure (!) 129/58, pulse 66, temperature 97.8 F (36.6 C), temperature source Oral, resp. rate 18, height 5' 4 (1.626  m), weight 90.5 kg, SpO2 95%.        Intake/Output Summary (Last 24 hours) at 05/05/2024 9072 Last data filed at 05/05/2024 0900 Gross per 24 hour  Intake 1920.72 ml  Output 200 ml  Net 1720.72 ml   Filed Weights   05/04/24 1114 05/04/24 1815  Weight: 87.5 kg 90.5 kg    Examination: General: acute on chronic elderly female, status post-op day 1 crani, NAD, sitting up in ICU bed HEENT: Normocephalic, JP drain output-minimal dark serosanguineous output, PERRLA intact-55mm b/l, pink intact Lungs: clear, diminished in lower bases, NAD Abdomen: bs active Extremities: moves all extremities to command Neuro: follows all commands, alert and oriented x 4 GU: deferred    Resolved problem list   Assessment and Plan  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 s/p craniectomy  -exoneration of rt frontal sinus with coverage of paracranial graft- see OP note Right frontal meningioma status post right frontal craniectomy/resection on 02/05/23 P:  Continue post-op management per neurosurg, appreciate assistance SBP goal <160  Continue multimodal pain management, with bowel regimen  Continue seizure/aspiration precautions Continue Neuro protective measures: euglycemia, euvolemia, normoxia, normocapnia, continue to keep electrolytes in normal ranges Continue ancef  for surgical prophylaxis  Continue dexamethasone   Continue to monitor JP drain output  PT/OT consult   Asthma  OSA-hypoglossal nerve stimulator (inspire) 12/18/2021 Follows MD Jude with PCCM outpatient  P: Continue albuterol  PRN Continue singulair   Will need to reach out to neurosurg in regards to restarting inspire device  Continue pulse ox continuous  PAF on warfarin  6/24 Echo- 60- 65% EF, no regional wall abnormalities, mild to moderate MR, moderate AR  HTN -on warfarin for AC -on solatol for afib  P:  Continue on cardiac tele  Continue to hold AC until neurosurg approves  Continue irbesartan   and chlorathiadone  Continue solatol  PRN hydralazine    GERD  P: Continue protonix  PPI  Polymyalgia rheumatica  On chronic steroids- prednisone  5mg  tablet  P: Continue to hold prednisone  while on dexamethasone      Best Practice (right click and Reselect all SmartList Selections daily)   Diet/type: NPO DVT prophylaxis SCD Pressure ulcer(s): N/A GI prophylaxis: PPI Lines: N/A Foley:  N/A Code Status:  full code Last date of multidisciplinary goals of care discussion - per primary   Labs   CBC: Recent Labs  Lab 05/05/24 0517  WBC 9.5  HGB 11.9*  HCT 35.6*  MCV 98.1  PLT 142*    Basic Metabolic Panel: Recent Labs  Lab 05/05/24 0517  NA 134*  K 4.3  CL 100  CO2 24  GLUCOSE 173*  BUN 27*  CREATININE 1.03*  CALCIUM 8.4*  MG 1.9  PHOS 4.0   GFR: Estimated Creatinine Clearance: 47.5 mL/min (A) (by C-G formula based on SCr of 1.03 mg/dL (H)). Recent Labs  Lab 05/05/24 0517  WBC 9.5    Liver Function Tests: No results for input(s): AST, ALT, ALKPHOS, BILITOT, PROT, ALBUMIN in the last 168 hours. No results for input(s): LIPASE, AMYLASE in the last 168 hours. No results for input(s): AMMONIA in the last 168 hours.  ABG    Component Value Date/Time   TCO2 25 07/06/2008 0837     Coagulation Profile: Recent Labs  Lab 05/04/24 1101  INR 1.0    Cardiac Enzymes: No results for input(s): CKTOTAL, CKMB, CKMBINDEX, TROPONINI in the last 168 hours.  HbA1C: No results found for: HGBA1C  CBG: No results for input(s): GLUCAP in the last 168 hours.  Review of Systems:   See HPI   Past Medical History:  She,  has a past medical history of Anemia, Atrial fibrillation (HCC), Bronchitis, Dysrhythmia, Elevated coronary artery calcium score (02/23/2024), FUO (fever of unknown origin) (03/03/2015), GERD (gastroesophageal reflux disease), Heart murmur, Hepatitis (1980s), Hepatitis, HTN (hypertension), Hypercholesterolemia,  Night sweat (03/03/2015), PMR (polymyalgia rheumatica) (HCC) (03/15/2015), Polyarthritis (03/03/2015), Polymyalgia (HCC) (03/03/2015), and Sleep apnea.   Surgical History:   Past Surgical History:  Procedure Laterality Date   ABDOMINAL HYSTERECTOMY  1973   APPENDECTOMY  09/24/1971   APPLICATION OF CRANIAL NAVIGATION Right 02/05/2023   Procedure: APPLICATION OF CRANIAL NAVIGATION;  Surgeon: Paislei Dorval Alm RAMAN, MD;  [facility] OR;  Service: Neurosurgery;  Laterality: Right;   CHOLECYSTECTOMY N/A 10/08/2022   Procedure: LAPAROSCOPIC CHOLECYSTECTOMY;  Surgeon: Belinda Cough, MD;  Location: [facility] OR;  Service: General;  Laterality: N/A;   CRANIOTOMY Right 02/05/2023   Procedure: Craniotomy - right for meningioma - Frontal;  Surgeon: Janelly Switalski Alm RAMAN, MD;  Location: [facility] OR;  Service: Neurosurgery;  Laterality: Right;   DRUG INDUCED ENDOSCOPY N/A 11/14/2021   Procedure: DRUG INDUCED SLEEP ENDOSCOPY;  Surgeon: Carlie Clark, MD;  Location: [facility];  Service: ENT;  Laterality: N/A;   FOOT SURGERY Left 09/24/2007   IMPLANTATION OF HYPOGLOSSAL NERVE STIMULATOR Right 12/18/2021   Procedure: IMPLANTATION OF HYPOGLOSSAL NERVE STIMULATOR;  Surgeon: Carlie Clark, MD;  Location: [facility];  Service: ENT;  Laterality: Right;   INTRAOPERATIVE CHOLANGIOGRAM N/A 10/08/2022   Procedure: INTRAOPERATIVE CHOLANGIOGRAM;  Surgeon:  Belinda Cough, MD;  Location: [facility] OR;  Service: General;  Laterality: N/A;   KNEE ARTHROSCOPY  09/23/2005   right   OOPHORECTOMY Bilateral 2001   TUBAL LIGATION  09/23/1970     Social History:   reports that she quit smoking about 43 years ago. Her smoking use included cigarettes. She started smoking about 48 years ago. She has a 1 pack-year smoking history. She has never used smokeless tobacco. She reports current alcohol  use of about 14.0 - 21.0 standard drinks of alcohol  per week. She reports that she does not use drugs.   Family History:  Her family  history includes Allergies in her daughter, daughter, and son; Breast cancer in an other family member; Hypertension in her mother and sister; Lung cancer in her father; Multiple sclerosis in her daughter.   Allergies Allergies  Allergen Reactions   Atorvastatin Other (See Comments)    Muscle cramps severe      Simvastatin Other (See Comments)    Severe muscle cramps   Cephalosporins     'ran a fever   Nickel Rash   Sulfa Antibiotics Hives   Sulfasalazine Hives   Eliquis [Apixaban] Hives and Itching   Xarelto  [Rivaroxaban ] Hives and Itching     Home Medications  Prior to Admission medications   Medication Sig Start Date End Date Taking? Authorizing Provider  b complex vitamins capsule Take 1 capsule by mouth daily.   Yes [provider]  chlorthalidone  (HYGROTON ) 25 MG tablet Take 1 tablet (25 mg total) by mouth daily. 03/06/16  Yes [place], Peter M, MD  diphenhydrAMINE  (BENADRYL ) 25 MG tablet Take 25 mg by mouth at bedtime.   Yes [provider]  doxylamine, Sleep, (UNISOM) 25 MG tablet Take 25 mg by mouth at bedtime.   Yes [provider]  estradiol (VIVELLE-DOT) 0.025 MG/24HR Place 1 patch onto the skin 2 (two) times a week. 06/02/23  Yes [provider]  ezetimibe  (ZETIA ) 10 MG tablet TAKE 1 TABLET BY MOUTH EVERY DAY 07/24/22  Yes [place], Peter M, MD  fluticasone  (FLONASE ) 50 MCG/ACT nasal spray Place 2 sprays into both nostrils daily. 08/28/19  Yes [provider]  irbesartan  (AVAPRO ) 300 MG tablet Take 300 mg by mouth daily.   Yes [provider]  Magnesium  Oxide (MAG-OXIDE PO) Take 200 mg by mouth at bedtime.   Yes [provider]  montelukast  (SINGULAIR ) 10 MG tablet Take 10 mg by mouth at bedtime. 10/17/19  Yes [provider]  Multiple Vitamin (MULTI-VITAMINS) TABS Take 1 tablet by mouth daily.    Yes [provider]  Multiple Vitamins-Minerals (OCUVITE EYE HEALTH FORMULA) CAPS Take 1 tablet by  mouth daily. 12/30/11  Yes [provider]  omeprazole (PRILOSEC) 20 MG capsule Take 20 mg by mouth daily.   Yes [provider]  OVER THE COUNTER MEDICATION Take 2 tablets by mouth at bedtime. Restful monk sleep supplement (without melatonin)   Yes [provider]  predniSONE  (DELTASONE ) 5 MG tablet Take 5 mg by mouth daily with breakfast.   Yes [provider]  REPATHA SURECLICK 140 MG/ML SOAJ Inject 140 mg into the skin every 14 (fourteen) days. 04/10/24  Yes [provider]  sotalol  (BETAPACE ) 80 MG tablet Take 80 mg by mouth 2 (two) times daily.   Yes [provider]  traZODone (DESYREL) 50 MG tablet Take 50 mg by mouth at bedtime. 01/10/23  Yes [provider]  TRYPTOPHAN  PO Take 1 capsule by mouth at bedtime.  Yes [provider]  venlafaxine  XR (EFFEXOR -XR) 150 MG 24 hr capsule Take 150 mg by mouth daily.   Yes [provider]  Vitamin D, Ergocalciferol, (DRISDOL) 1.25 MG (50000 UNIT) CAPS capsule Take 50,000 Units by mouth every Monday. 10/21/22  Yes [provider]  warfarin (COUMADIN ) 5 MG tablet TAKE 1 TABLET BY MOUTH IN THE AFTERNOON OR AS DIRECTED BY COUMADIN  CLINIC 03/05/24  Yes Cindie Ole DASEN, MD  albuterol  (PROAIR  HFA) 108 (90 Base) MCG/ACT inhaler Inhale 2 puffs into the lungs every 6 (six) hours as needed. 09/14/18   Jude Harden GAILS, MD     Critical care time: 50 mins     Christian Claudene CANNON   [facility] 05/05/2024, 9:27 AM  Please see [email or web address] for pager details.  From 7A-7P if no response, please call [phone]. After hours, please call ELink [phone].

## 2024-05-09 ENCOUNTER — Telehealth: Payer: Self-pay | Admitting: Pulmonary Disease

## 2024-05-09 DIAGNOSIS — R0683 Snoring: Secondary | ICD-10-CM | POA: Diagnosis not present

## 2024-05-09 LAB — AEROBIC/ANAEROBIC CULTURE W GRAM STAIN (SURGICAL/DEEP WOUND)
Culture: NO GROWTH
Gram Stain: NONE SEEN

## 2024-05-09 NOTE — Telephone Encounter (Signed)
 HST on inspire 0.9 V >> very mild OSA persists AHI 8/h with minimal desat  >> this would be acceptable settings if she is otherwise sleeping OK.  Hope she is recovering well from surgery. Keep routine FU OV 4-6 months

## 2024-05-10 NOTE — Telephone Encounter (Signed)
 Mrs. Pennie INSPIRE follow-up office visit with Dr. Alva is scheduled for 11/10. She has been informed of her upcoming appointment, and a printed reminder has been mailed.

## 2024-05-10 NOTE — Telephone Encounter (Signed)
 I will contact Mrs. [name] [name] to schedule her INSPIRE office visit follow-up in November. Once SNAP HST has been signed and sent; it will be uploaded.

## 2024-05-11 ENCOUNTER — Ambulatory Visit: Attending: Cardiology | Admitting: *Deleted

## 2024-05-11 DIAGNOSIS — Z7901 Long term (current) use of anticoagulants: Secondary | ICD-10-CM | POA: Diagnosis not present

## 2024-05-11 DIAGNOSIS — I480 Paroxysmal atrial fibrillation: Secondary | ICD-10-CM

## 2024-05-11 LAB — POCT INR: INR: 0.9 — AB (ref 2.0–3.0)

## 2024-05-11 NOTE — Patient Instructions (Signed)
 Description   Take 1.5 tablets today then continue taking warfarin 1 tablet daily except 1/2 tablet on Mondays. Recheck INR 1 week. Anticoagulation Clinic [phone]

## 2024-05-11 NOTE — Progress Notes (Signed)
 INR-0.9; Take 1.5 tablets today then continue taking warfarin 1 tablet daily except 1/2 tablet on Mondays. Recheck INR 1 week. Anticoagulation Clinic [phone]

## 2024-05-19 DIAGNOSIS — J341 Cyst and mucocele of nose and nasal sinus: Secondary | ICD-10-CM | POA: Diagnosis not present

## 2024-05-19 DIAGNOSIS — J324 Chronic pansinusitis: Secondary | ICD-10-CM | POA: Diagnosis not present

## 2024-05-19 DIAGNOSIS — G060 Intracranial abscess and granuloma: Secondary | ICD-10-CM | POA: Diagnosis not present

## 2024-05-20 ENCOUNTER — Ambulatory Visit: Attending: Cardiology | Admitting: Pharmacist Clinician (PhC)/ Clinical Pharmacy Specialist

## 2024-05-20 ENCOUNTER — Other Ambulatory Visit: Payer: Self-pay

## 2024-05-20 ENCOUNTER — Emergency Department (HOSPITAL_BASED_OUTPATIENT_CLINIC_OR_DEPARTMENT_OTHER)
Admission: EM | Admit: 2024-05-20 | Discharge: 2024-05-20 | Disposition: A | Discharge status: Home / Self Care | Attending: Emergency Medicine | Admitting: Emergency Medicine

## 2024-05-20 DIAGNOSIS — I480 Paroxysmal atrial fibrillation: Secondary | ICD-10-CM | POA: Diagnosis not present

## 2024-05-20 DIAGNOSIS — I4891 Unspecified atrial fibrillation: Secondary | ICD-10-CM | POA: Diagnosis not present

## 2024-05-20 DIAGNOSIS — Z7901 Long term (current) use of anticoagulants: Secondary | ICD-10-CM | POA: Insufficient documentation

## 2024-05-20 DIAGNOSIS — I152 Hypertension secondary to endocrine disorders: Secondary | ICD-10-CM | POA: Diagnosis not present

## 2024-05-20 DIAGNOSIS — G4733 Obstructive sleep apnea (adult) (pediatric): Secondary | ICD-10-CM | POA: Diagnosis not present

## 2024-05-20 DIAGNOSIS — R000 Tachycardia, unspecified: Secondary | ICD-10-CM | POA: Diagnosis not present

## 2024-05-20 DIAGNOSIS — E119 Type 2 diabetes mellitus without complications: Secondary | ICD-10-CM | POA: Diagnosis not present

## 2024-05-20 DIAGNOSIS — I10 Essential (primary) hypertension: Secondary | ICD-10-CM | POA: Diagnosis not present

## 2024-05-20 LAB — CBC WITH DIFFERENTIAL/PLATELET
Abs Immature Granulocytes: 0.14 K/uL — ABNORMAL HIGH (ref 0.00–0.07)
Basophils Absolute: 0 K/uL (ref 0.0–0.1)
Basophils Relative: 0 %
Eosinophils Absolute: 0 K/uL (ref 0.0–0.5)
Eosinophils Relative: 0 %
HCT: 35.4 % — ABNORMAL LOW (ref 36.0–46.0)
Hemoglobin: 12.1 g/dL (ref 12.0–15.0)
Immature Granulocytes: 1 %
Lymphocytes Relative: 23 %
Lymphs Abs: 2.3 K/uL (ref 0.7–4.0)
MCH: 32.8 pg (ref 26.0–34.0)
MCHC: 34.2 g/dL (ref 30.0–36.0)
MCV: 95.9 fL (ref 80.0–100.0)
Monocytes Absolute: 0.7 K/uL (ref 0.1–1.0)
Monocytes Relative: 7 %
Neutro Abs: 6.7 K/uL (ref 1.7–7.7)
Neutrophils Relative %: 69 %
Platelets: 246 K/uL (ref 150–400)
RBC: 3.69 MIL/uL — ABNORMAL LOW (ref 3.87–5.11)
RDW: 13.7 % (ref 11.5–15.5)
WBC: 10 K/uL (ref 4.0–10.5)
nRBC: 0 % (ref 0.0–0.2)

## 2024-05-20 LAB — POCT INR: INR: 2.1 (ref 2.0–3.0)

## 2024-05-20 LAB — COMPREHENSIVE METABOLIC PANEL WITH GFR
ALT: 19 U/L (ref 0–44)
AST: 22 U/L (ref 15–41)
Albumin: 4.1 g/dL (ref 3.5–5.0)
Alkaline Phosphatase: 115 U/L (ref 38–126)
Anion gap: 17 — ABNORMAL HIGH (ref 5–15)
BUN: 21 mg/dL (ref 8–23)
CO2: 20 mmol/L — ABNORMAL LOW (ref 22–32)
Calcium: 9.5 mg/dL (ref 8.9–10.3)
Chloride: 94 mmol/L — ABNORMAL LOW (ref 98–111)
Creatinine, Ser: 1 mg/dL (ref 0.44–1.00)
GFR, Estimated: 57 mL/min — ABNORMAL LOW (ref >60)
Glucose, Bld: 133 mg/dL — ABNORMAL HIGH (ref 70–99)
Potassium: 4 mmol/L (ref 3.5–5.1)
Sodium: 130 mmol/L — ABNORMAL LOW (ref 135–145)
Total Bilirubin: 0.6 mg/dL (ref 0.0–1.2)
Total Protein: 7.4 g/dL (ref 6.5–8.1)

## 2024-05-20 LAB — TROPONIN T, HIGH SENSITIVITY
Troponin T High Sensitivity: 23 ng/L — ABNORMAL HIGH (ref 0–19)
Troponin T High Sensitivity: 42 ng/L — ABNORMAL HIGH (ref 0–19)

## 2024-05-20 LAB — LACTIC ACID, PLASMA: Lactic Acid, Venous: 1.9 mmol/L (ref 0.5–1.9)

## 2024-05-20 LAB — MAGNESIUM: Magnesium: 1.8 mg/dL (ref 1.7–2.4)

## 2024-05-20 MED ORDER — SODIUM CHLORIDE 0.9 % IV BOLUS
500.0000 mL | Freq: Once | INTRAVENOUS | Status: AC
  Administered 2024-05-20: 500 mL via INTRAVENOUS

## 2024-05-20 NOTE — Patient Instructions (Signed)
 Continue taking warfarin 1 tablet daily except 1/2 tablet on Mondays. Sinus surgery, needs to hold x 5 day, last dose Sept 3.  Resume Sept 9 or 10 as directed Anticoagulation Clinic [phone]

## 2024-05-20 NOTE — Discharge Instructions (Addendum)
 1.  Continue all of your regular medications. 2.  Call your cardiologist to schedule follow-up as soon as possible. 3.  Return to the emergency department if you have return of symptoms of racing heart, chest pain, lightheadedness or any other concerning changes.

## 2024-05-20 NOTE — ED Triage Notes (Signed)
 A+Ox4. Ambulatory to triage. Feels rapid HR. Denies CP or pressure. Denies SOB. Removal of bone flap 8/12- bruising to face. Takes Coumadin - hx afib.

## 2024-05-20 NOTE — Progress Notes (Signed)
 Inr today is 2.1, please see anticoag encounter Lab Results  Component Value Date   INR 2.1 05/20/2024   INR 0.9 (A) 05/11/2024   INR 1.0 05/04/2024    Description   Continue taking warfarin 1 tablet daily except 1/2 tablet on Mondays. Sinus surgery, needs to hold x 5 day, last dose Sept 3.  Resume Sept 9 or 10 as directed [facility] [phone]

## 2024-05-20 NOTE — Unmapped External Note (Signed)
 PREOPERATIVE TELEMEDICINE ASSESSMENT EVALUATION Encounter Date: 05/20/24   Location Information: Patient State (at time of visit): [place]  Patient Location (at time of visit):Home/Other Non-Medical  Provider Location: [place]  Is provider licensed to provide clinical care in the current location/state of the patient? Yes   Consent:  Patient's identity was confirmed. Presenting condition or illness was discussed with the patient/personal representative. Current proposed treatment for presenting condition or illness was explained to patient/personal representative along with the likely benefits and any significant risks or complications associated with the provision of treatment by audio/video means. The patient/personal representative verbally authorized treatment to be provided by audio/video, which may include a limited review of patient's current health status, medication, or other treatment recommendations, patient education, and an opportunity to ask questions about condition and treatment. Verbal Consent Granted by Patient/Personal Representative:Yes    Visit Information: Modality: Audio-Only  Time Spent on Phone w/ Patient: 10  PRE-OP preformed via telephone, reviewed medical problems, ROS, Anesthesia Considerations, and Discussed Medication Instructions. If the patient has a [facility] account the AVS was shared with them.  Patient has been advised of the limitations and limited nature of physical exam due to nature of a telephone visit, the possibility of privacy risk in the use of a telephone visit, and that the healthcare provider may recommend visiting a healthcare clinic for in-person care and follow up. Pt Needs a Physical exam performed on DOS. Patient may need to sign consent on DOS.    PROCEDURE PLANNED   [name] [name] ([id number]) is a 78 y.o. female scheduled for a  Procedure Information     Date: 06/01/24   Procedure: ENDOSCOPY FUNCTIONAL SINUS  SURGERY (Bilateral: Nose) - right 31259, left 31288, bilateral 68732, right 31276   Location: [facility] OR   Surgeons: Bard Jakie Ill, MD     .  [name] [name] is an ASA 3, 88 kg,  Body mass index is 33.3 kg/m., who presents today for medical optimization and to ensure stabilization, of the following sPMH: OSA, HTN,GERD,DM and PMR and atrial fib.  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 For full details regarding this pt's reason for surgery please review their surgeon's H&P.      ASSESSMENT AND PLAN  Anesthesia History:  - Pt denies history of PONV or other anesthesia complications   - No family history of adverse reaction to anesthesia.    - 05-04-24-REMOVAL OF CRANIOTOMY BONE FLAP -MAC and 4, Grade 1 view,7.0 mm ETT  - 02-05-23-Craniotomy - right for meningioma -Grade 2; Endotracheal Tube; MAC, 3; Oral; 7 mm; Min.occ.pres.; 1; Stylet   - 10-08-22-LAPAROSCOPIC CHOLECYSTECTOMY -Grade 1; Endotracheal Tube; MAC, 3; Oral; 7 mm; Min.occ.pres.; 1; Stylet   - 12-18-21-IMPLANTATION OF HYPOGLOSSAL NERVE STIMULATOR -Grade 2; Endotracheal Tube; MAC, 3; Oral; 7 mm; Min.occ.pres.; 1; Stylet   Surgical History: Surgical History[1]  Assessment of Medical Problems: -------------------------------------------------------------------------------------------------- 1.) Surgical Complaint  -- ENDOSCOPY FUNCTIONAL SINUS SURGERY : as discussed in HPI; reason for surgery -------------------------------------------------------------------------------------------------- 2.) CARDIAC: -- Exercise Tolerance: METs= 7.34, DASI=37.45, (DASI scores above 35 are less likely to need cardiac testing prior to surgery); Typical activity level consists of doing housework -- A fib: Stable, Patient is rate controlled with BB which they will continue DOS. AC w/ Warfarin (Coumadin ). Their last dose of anticoagulation prior to  surgery will be 05-26-24.. -- HTN:  stop ARB 24 hours prior to surgery, continue BB, and hold [place]  the  day of surgery  BP Readings from Last 3 Encounters:  05/19/24 (!) 141/52  04/05/24 (!) 136/50     Pt denies recent chest pain. PT/INR ordered for DOS  Per cardiology note 02-04-24 in care everywhere by DR. Cindie: [name] [name] is a 78 year old woman who I am seeing today for an evaluation of atrial fibrillation at the request of Dr. [place]. The patient has a history of atrial fibrillation dating back to 2003 managed with sotalol . She was previously on Xarelto  but this was changed to Eliquis because of a drug rash. This was also subsequently stopped and she was transitioned to Coumadin .  ASSESSMENT AND PLAN:   1. Paroxysmal atrial fibrillation (HCC)  2. Encounter for long-term (current) use of high-risk medication   #Atrial fibrillation #High risk med monitoring-sotalol  The patient has persistent atrial fibrillation managed with sotalol . Today's EKG showed QTc acceptable for ongoing sotalol  use.  She is currently on warfarin for stroke prophylaxis after having to stop Eliquis and Xarelto . We discussed the role of left atrial appendage occlusion in patients with atrial fibrillation. She is interested in proceeding with evaluation. She is concerned about the bleeding risk of Coumadin  given her history of meningioma.   03-16-24-ECHO: IMPRESSIONS  1. Left ventricular ejection fraction, by estimation, is 60 to 65%. The left ventricle has normal function. The left ventricle has no regional wall motion abnormalities. Left ventricular diastolic parameters are consistent with Grade I diastolic  dysfunction (impaired relaxation).   2. Right ventricular systolic function is normal. The right ventricular size is normal.   3. Left atrial size was mildly dilated.   4. Right atrial size was mildly dilated.   5. The mitral valve is abnormal. Mild to moderate mitral valve regurgitation. No evidence of  mitral stenosis. There is mild late systolic prolapse of both leaflets of the mitral valve.   6. The aortic valve is tricuspid. There is mild calcification of the aortic valve. Aortic valve regurgitation is moderate. No aortic stenosis is present.   7. The inferior vena cava is normal in size with greater than 50% respiratory variability, suggesting right atrial pressure of 3 mmHg.    02-23-24-CT Heart Eval card & morph: MPRESSION:  1. There is no thrombus in the left atrial appendage. LAA landing  zone measures 22mm x 20mm in diameter with depth 15mm, appropriate  for a 27mm FLX device   2. Coronary calcium score 99 (51st percentile). Appears at least  moderate stenosis in proximal LAD, but study was performed without  use of NTG and insufficient for plaque evaluation; recommend  coronary CTA to evaluate for obstructive CAD  -------------------------------------------------------------------------------------------------- 3.) RESPIRATORY:  -- OSA: pt has HYPOGLOSSAL NERVE STIMULATOR --Asthma-,stable, per pt is breathing at her baseline.  -- Patient is stable and does not require further pulmonary testing prior to surgery  Per pt is breathing at her baseline -------------------------------------------------------------------------------------------------- 4.) NEURO:  -- Hx of meningioma: s/p right frontal craniotomy for removal in May 2024  -------------------------------------------------------------------------------------------------- 5.) ENDOCRINE:  -- DMT 2: Stable, pt does not check home glucose, HgbA1c done in PAC -------------------------------------------------------------------------------------------------- 6.) GI:  -- GERD: Stable, Continue PPI on DOS -------------------------------------------------------------------------------------------------- 7.) PSYCH:  -- Depression: Stable, continue psych meds  DOS -------------------------------------------------------------------------------------------------- 8.) HEMATOLOGY:  -- Anemia: last Hgb was 11.9 on 05-05-24. -------------------------------------------------------------------------------------------------- 9.)PMR dx'ed in 2016. Instructed to take prednisone  as usual. --------------------------------------------------------------------------------------------------  Diagnostics pending: CBC, BMP, and A1C-instructed [facility].  Diagnostics Review: BP Readings from Last 3 Encounters:  05/19/24 (!) 141/52  04/05/24 (!) 136/50    Wt Readings from Last 3 Encounters:  05/20/24 88 kg (194 lb)  04/05/24 88.5 kg (195 lb)  02/19/24 93 kg (205 lb)     For pt's complete list of medication instructions please review separate AVS note.  -- Addendum for Additional Evaluation, Management, and Time ---  During the course of this preoperative evaluation, additional time and effort was required that extends beyond a routine presurgical assessment.  The assessment and plan that is detailed above which  identifies the extenuating medical or social circumstances that required  further diagnostic evaluation, planning  and active management in order to optimize this patient prior to surgery.   I have personally spent 30 minutes involved in face-to-face and non-face-to-face activities for this patient on the day of the visit.  Professional time spent includes the following activities, in addition to those noted in the documentation: preparing to see the patient by review of history and previous tests, obtaining and reviewing separately obtained history, performing medically appropriate examination and evaluation, counseling and engaging in shared decision making with the patient and family available, and ordering further tests with independent interpretation       D[facility] Activity Status Index: No DASI Score Found   Anesthesia  Plan/Consents:  Anesthesia Plan  Plan ASA score: 3     PAC Attestation: Disposition: I have completed the preoperative examination and reviewed pending studies. At this time, this patient is medically prepared for the planned procedure and can proceed.  Anesthesia was discussed with notations that the final disposition regarding anesthetic management will be addressed by the anesthesiologist on the day of surgery.   I have reviewed the patient's presurgical instructions and preop medication instructions. The patient verbalized understanding of this information. Please refer to the surgeon's clinic notes for documentation of the reason for surgery, details of the surgical pathology, and consents related to the procedure.  Patient instructed to bring photo id the day of surgery, remain NPO after 11PM except for clear liquids 2 hours prior to arrival time, and to call for interval change mandating surgery cancellation.   Electronically signed by: Debby Dallas Cedar, NP, 05/20/2024 7:01 AM   Supervised by Dr. Athena    HISTORY/ROS/OBJECTIVE  Active Problem list: Patient Active Problem List   Diagnosis Date Noted   . 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 Chronic pansinusitis 02/19/2024  . BMI 30.0-30.9,adult   . OSA on CPAP   . Excessive cerumen in ear canal, right 11/14/2020    Resolved Problems  No resolved problems to display.   Past Medical History: Medical History[2] Social History:   Social History   Tobacco Use  . Smoking status: Former  . Smokeless tobacco: Never  Substance Use Topics  . Alcohol  use: Yes    Family History: Family History[3] ROS:  Review of Systems  Constitutional: Negative for chills, fever, weight gain and weight loss.  HENT:  Negative for congestion, nosebleeds and sore throat.   Eyes:  Negative for blurred vision, discharge, double vision and pain.  Cardiovascular:  Negative for chest pain, irregular heartbeat, near-syncope, orthopnea and  palpitations.  Respiratory:  Negative for cough, shortness of breath and wheezing.   Endocrine: Positive for heat intolerance. Negative for cold intolerance.  Hematologic/Lymphatic: Negative for bleeding problem. Bruises/bleeds easily.  Skin:  Positive for dry skin and itching. Negative for rash.  Musculoskeletal:  Negative for back pain, joint pain and neck pain.  Gastrointestinal:  Negative for abdominal pain, constipation, diarrhea,  heartburn, nausea and vomiting.  Genitourinary:  Negative for dysuria and hematuria.  Neurological:  Positive for headaches. Negative for dizziness and seizures.       Occ headaches    Vitals: Vitals:   05/20/24 0730  Weight: 88 kg (194 lb)  Height: 1.626 m (5' 4)   Please update DOS Physical Exam:  Physical Exam  Dental - normal exam   Please update DOS Allergies: Allergies[4] Medications: Current Medications[5]  Labs: No results found for: WBC, HGB, HCT, PLT No results found for: NA, K, CL, CO2, CREATININE, GLU, CALCIUM, PROT, ALBUMIN, BILITOT, AST, ALT No components found for: HBA1C No results found for: INR No results found for: TSH       [1] Past Surgical History: Procedure Laterality Date  . APPENDECTOMY    . BRAIN SURGERY     removal of right frontal meningioma  . CATARACT EXTRACTION     bilateral  . CHOLECYSTECTOMY  09/2022  . COLONOSCOPY    . DILATION AND CURETTAGE OF UTERUS    . EYE SURGERY Bilateral    Procedure: EYE SURGERY  . FOOT SURGERY Left    Procedure: FOOT SURGERY  . HYSTERECTOMY      Procedure: HYSTERECTOMY  . INSPIRE APNEA STIMULATOR PLACEMENT     Procedure: INSPIRE APNEA STIMULATOR PLACEMENT  . KNEE ARTHROPLASTY  2007   right  . OOPHORECTOMY  2001  . OTHER SURGICAL HISTORY Right 2022   Procedure: OTHER SURGICAL HISTORY (RIGT HAND)  . PARTIAL HYSTERECTOMY  1972  . SKIN BIOPSY     Procedure: SKIN BIOPSY  . TOE SURGERY  2009   left foot  . TOENAIL EXCISION      bilateral  . TONSILLECTOMY  1955  . TOTAL ABDOMINAL HYSTERECTOMY    . TUBAL LIGATION    [2] Past Medical History: Diagnosis Date  . Allergy    . Atrial fibrillation    (CMD) 2001  . Diabetes mellitus    (CMD)   . GERD (gastroesophageal reflux disease)   . Hypertension   . Irregular heart beat 2001  . Long term current use of anticoagulant   . Lung disease   . OSA on CPAP   . Varicella   [3] Family History Problem Relation Name Age of Onset  . Psoriasis Mother    . Cancer Mother    . Hypertension Mother    . Hypertension Sister    . Cancer Father Lamar 80 - 99  . Eczema Neg Hx    [4] Allergies Allergen Reactions  . Atorvastatin GI Intolerance    Muscle cramps severe  . Simvastatin GI Intolerance    Severe muscle cramps  . Cephalosporins Other (See Comments)  . Nickel Rash  . Eliquis [Apixaban] Itching  . Semaglutide     Other Reaction(s): Recurrent nausea vomiting  . Sulfasalazine Hives    hives  . Hydromorphone  Itching and Rash  . Rivaroxaban  Hives and Itching    Other Reaction(s): 2001 treeated for A-fib, Not available, Unknown  [5]  Current Outpatient Medications:  .  amoxicillin (AMOXIL) 500 mg capsule, Take 2000 mg (4 capsules) by mouth once one hour prior to dental procedure., Disp: , Rfl: 4 .  cetirizine (ZyrTEC) 10 mg tablet, Take 10 mg by mouth daily as needed for allergies., Disp: , Rfl:  .  chlorthalidone  (HYGROTON ) 25 mg tablet, Take 25 mg by mouth every morning., Disp: , Rfl:  .  diphenhydrAMINE  (BENADRYL ) 25 mg capsule, Take 25 mg by mouth nightly as needed for  sleep., Disp: , Rfl:  .  ergocalciferol (VITAMIN D2) 1,250 mcg (50,000 unit) capsule, Take 50,000 Units by mouth once a week., Disp: , Rfl:  .  estradioL (VIVELLE-DOT) 0.025 mg/24 hr transdermal patch, Place 1 patch on the skin 2 (two) times a week., Disp: , Rfl:  .  ezetimibe  (ZETIA ) 10 mg tablet, Take 10 mg by mouth daily., Disp: , Rfl:  .  fluticasone  propionate (FLONASE ) 50 mcg/spray nasal  spray, Administer 1 spray into each nostril daily., Disp: , Rfl:  .  irbesartan  (AVAPRO ) 300 mg tablet, Take 300 mg by mouth daily., Disp: , Rfl:  .  meloxicam (MOBIC) 15 mg tablet, Take 15 mg by mouth daily as needed for mild pain (1-3)., Disp: , Rfl:  .  montelukast  (SINGULAIR ) 10 mg tablet, Take 10 mg by mouth daily., Disp: , Rfl:  .  multivitamin (THERAGRAN) tab tablet, Take 1 tablet by mouth daily., Disp: , Rfl:  .  omeprazole 20 mg TbEC, Take 20 mg by mouth in the morning., Disp: , Rfl:  .  ondansetron  (ZOFRAN -ODT) 4 mg disintegrating tablet, Dissolve 4 mg on tongue every 8 (eight) hours as needed for nausea or vomiting., Disp: , Rfl:  .  predniSONE  (DELTASONE ) 5 mg tablet, Take 5 mg by mouth daily., Disp: , Rfl:  .  Repatha SureClick 140 mg/mL pnij, Inject 140 mg under the skin every 14 (fourteen) days., Disp: , Rfl:  .  sotaloL  (BETAPACE ) 80 mg tablet, Take 80 mg by mouth 2 (two) times a day., Disp: , Rfl:  .  traZODone (DESYREL) 50 mg tablet, Take 50 mg by mouth at bedtime., Disp: , Rfl:  .  venlafaxine  (EFFEXOR  XR) 150 mg 24 hr capsule, Take 150 mg by mouth daily., Disp: , Rfl:  .  warfarin (COUMADIN ) 5 mg tablet, Current dose as of 05-19-2024:  Take 5 mg by mouth Tuesday, Wednesday, Thursday, Friday, Saturday, Sunday. Take 2.5 mg by mouth on Monday., Disp: , Rfl:

## 2024-05-20 NOTE — ED Provider Notes (Signed)
 [place] EMERGENCY DEPARTMENT AT [facility] Provider Note   CSN: [id number] Arrival date & time: 05/20/24  1727     Patient presents with: Tachycardia   [name] [name] is a 78 y.o. female.   HPI Patient reports that she has a history of atrial fibrillation.  However she is always in sinus rhythm.  Patient reports that she takes sotalol  and is anticoagulated on Coumadin .  Patient reports that this afternoon at about 2 PM she felt her heart beating fast.  She has a monitor on her phone and it reported her heart rate at 150.  Patient reports that he was not having any chest pain did not feel if she is in a pass out or short of breath.  Symptoms lasted for about an hour and she decided to come to the hospital.  Upon arrival to the emergency department, it had resolved.  Patient feels fine at this time with no chest pain no shortness of breath no lightheadedness.  She has been compliant with her sotalol  and her Coumadin .  She reports she had her INR checked today and it is at 2.1.  Patient more recently is having some complex ENT and skull surgeries for previous flap and sinusitis.  These things are being managed and stable.  No acute complaints regarding this.    Prior to Admission medications   Medication Sig Start Date End Date Taking? Authorizing Provider  albuterol  (PROAIR  HFA) 108 (90 Base) MCG/ACT inhaler Inhale 2 puffs into the lungs every 6 (six) hours as needed. 09/14/18   Jude Harden GAILS, MD  b complex vitamins capsule Take 1 capsule by mouth daily.    [provider]  chlorthalidone  (HYGROTON ) 25 MG tablet Take 1 tablet (25 mg total) by mouth daily. 03/06/16   [place], Peter M, MD  diphenhydrAMINE  (BENADRYL ) 25 MG tablet Take 25 mg by mouth at bedtime.    [provider]  doxylamine, Sleep, (UNISOM) 25 MG tablet Take 25 mg by mouth at bedtime.    [provider]  estradiol (VIVELLE-DOT) 0.025 MG/24HR Place 1 patch onto the skin 2 (two) times a  week. 06/02/23   [provider]  ezetimibe  (ZETIA ) 10 MG tablet TAKE 1 TABLET BY MOUTH EVERY DAY 07/24/22   [place], Peter M, MD  fluticasone  (FLONASE ) 50 MCG/ACT nasal spray Place 2 sprays into both nostrils daily. 08/28/19   [provider]  HYDROcodone -acetaminophen  (NORCO/VICODIN) 5-325 MG tablet Take 1 tablet by mouth every 4 (four) hours as needed for moderate pain (pain score 4-6). 05/05/24   Johnanna Credit Caylin, PA-C  irbesartan  (AVAPRO ) 300 MG tablet Take 300 mg by mouth daily.    [provider]  Magnesium  Oxide (MAG-OXIDE PO) Take 200 mg by mouth at bedtime.    [provider]  montelukast  (SINGULAIR ) 10 MG tablet Take 10 mg by mouth at bedtime. 10/17/19   [provider]  Multiple Vitamin (MULTI-VITAMINS) TABS Take 1 tablet by mouth daily.     [provider]  Multiple Vitamins-Minerals (OCUVITE EYE HEALTH FORMULA) CAPS Take 1 tablet by mouth daily. 12/30/11   [provider]  omeprazole (PRILOSEC) 20 MG capsule Take 20 mg by mouth daily.    [provider]  OVER THE COUNTER MEDICATION Take 2 tablets by mouth at bedtime. Restful monk sleep supplement (without melatonin)    [provider]  predniSONE  (DELTASONE ) 5 MG tablet Take 5 mg by mouth daily with breakfast.    [provider]  REPATHA  SURECLICK 140 MG/ML SOAJ Inject 140 mg into the skin every 14 (fourteen) days. 04/10/24   [provider]  sotalol  (BETAPACE ) 80 MG tablet Take 80 mg by mouth 2 (two) times daily.    [provider]  traZODone (DESYREL) 50 MG tablet Take 50 mg by mouth at bedtime. 01/10/23   [provider]  TRYPTOPHAN  PO Take 1 capsule by mouth at bedtime.    [provider]  venlafaxine  XR (EFFEXOR -XR) 150 MG 24 hr capsule Take 150 mg by mouth daily.    [provider]  Vitamin D, Ergocalciferol, (DRISDOL) 1.25 MG (50000 UNIT) CAPS capsule Take 50,000 Units by mouth every Monday. 10/21/22    [provider]  warfarin (COUMADIN ) 5 MG tablet TAKE 1 TABLET BY MOUTH IN THE AFTERNOON OR AS DIRECTED BY COUMADIN  CLINIC 03/05/24   Cindie Ole DASEN, MD    Allergies: Atorvastatin, Simvastatin, Cephalosporins, Nickel, Sulfa antibiotics, Sulfasalazine, Dilaudid  [hydromorphone ], Eliquis [apixaban], and Xarelto  [rivaroxaban ]    Review of Systems  Updated Vital Signs BP (!) 148/60   Pulse 61   Temp 98.2 F (36.8 C) (Oral)   Resp 14   SpO2 96%   Physical Exam Constitutional:      Comments: Alert nontoxic clear mental status no respiratory distress.  HENT:     Head:     Comments: Patient has diffuse resolving bruising of the face.  This is a postoperative from her described surgical interventions.    Mouth/Throat:     Pharynx: Oropharynx is clear.  Eyes:     Extraocular Movements: Extraocular movements intact.  Cardiovascular:     Rate and Rhythm: Normal rate and regular rhythm.  Pulmonary:     Effort: Pulmonary effort is normal.     Breath sounds: Normal breath sounds.  Abdominal:     General: There is no distension.     Palpations: Abdomen is soft.  Musculoskeletal:        General: No swelling or tenderness. Normal range of motion.     Right lower leg: No edema.     Left lower leg: No edema.  Skin:    General: Skin is warm and dry.  Neurological:     General: No focal deficit present.     Mental Status: She is oriented to person, place, and time.     Motor: No weakness.     Coordination: Coordination normal.  Psychiatric:        Mood and Affect: Mood normal.     (all labs ordered are listed, but only abnormal results are displayed) Labs Reviewed  COMPREHENSIVE METABOLIC PANEL WITH GFR - Abnormal; Notable for the following components:      Result Value   Sodium 130 (*)    Chloride 94 (*)    CO2 20 (*)    Glucose, Bld 133 (*)    GFR, Estimated 57 (*)    Anion gap 17 (*)    All other components within normal limits  CBC WITH DIFFERENTIAL/PLATELET -  Abnormal; Notable for the following components:   RBC 3.69 (*)    HCT 35.4 (*)    Abs Immature Granulocytes 0.14 (*)    All other components within normal limits  TROPONIN T, HIGH SENSITIVITY - Abnormal; Notable for the following components:   Troponin T High Sensitivity 23 (*)    All other components within normal limits  TROPONIN T, HIGH SENSITIVITY - Abnormal; Notable for the following components:   Troponin T High Sensitivity 42 (*)  All other components within normal limits  LACTIC ACID, PLASMA  MAGNESIUM   LACTIC ACID, PLASMA    EKG: None  Radiology: No results found.   Procedures   Medications Ordered in the ED  sodium chloride  0.9 % bolus 500 mL (0 mLs Intravenous Stopped 05/20/24 1950)                                    Medical Decision Making Amount and/or Complexity of Data Reviewed Labs: ordered.  Patient presents as outlined.  She has known history of atrial fibrillation chronically treated with sotalol  and Coumadin .  Patient describes an episode of A-fib or other possible tachycardia self limited this afternoon.  Will obtain diagnostic studies for ACS\electrolyte derangement  GFR 57 sodium 130 chloride 94 potassium 4.0 lactic acid 1.9 white count 10 H&H 12 and 35 troponin 23 repeat 42  Patient been completely asymptomatic throughout stay in the emergency department.  Patient did not experience chest pain, shortness of breath or near syncope with her event today.  Consult: Reviewed with cardiology fellow Dr. Garrel Gray, we reviewed the patient's medical record, recent diagnostic testing and results with troponin.  At this time he advises this is most likely of mild episode of demand ischemia.  At this time does not need repeat troponin or inpatient observation.  Patient may be discharged with plan for follow-up on outpatient basis.      Final diagnoses:  Paroxysmal atrial fibrillation [facility])    ED Discharge Orders     None          Armenta Canning, MD 05/20/24 2218

## 2024-05-21 ENCOUNTER — Encounter: Payer: Self-pay | Admitting: Cardiology

## 2024-05-25 ENCOUNTER — Telehealth (HOSPITAL_COMMUNITY): Payer: Self-pay

## 2024-05-25 NOTE — Telephone Encounter (Signed)
 Patient decline follow-up appointment

## 2024-05-27 ENCOUNTER — Other Ambulatory Visit (HOSPITAL_COMMUNITY): Payer: Self-pay | Admitting: Neurological Surgery

## 2024-05-27 DIAGNOSIS — T847XXD Infection and inflammatory reaction due to other internal orthopedic prosthetic devices, implants and grafts, subsequent encounter: Secondary | ICD-10-CM

## 2024-05-28 NOTE — Telephone Encounter (Signed)
 See documentation.

## 2024-05-29 ENCOUNTER — Other Ambulatory Visit: Payer: Self-pay | Admitting: Cardiology

## 2024-05-29 DIAGNOSIS — I480 Paroxysmal atrial fibrillation: Secondary | ICD-10-CM

## 2024-05-31 ENCOUNTER — Ambulatory Visit (HOSPITAL_COMMUNITY)
Admission: RE | Admit: 2024-05-31 | Discharge: 2024-05-31 | Disposition: A | Discharge status: Home / Self Care | Source: Ambulatory Visit | Attending: Neurological Surgery | Admitting: Neurological Surgery

## 2024-05-31 DIAGNOSIS — I6789 Other cerebrovascular disease: Secondary | ICD-10-CM | POA: Diagnosis not present

## 2024-05-31 DIAGNOSIS — T847XXD Infection and inflammatory reaction due to other internal orthopedic prosthetic devices, implants and grafts, subsequent encounter: Secondary | ICD-10-CM | POA: Insufficient documentation

## 2024-05-31 DIAGNOSIS — Y832 Surgical operation with anastomosis, bypass or graft as the cause of abnormal reaction of the patient, or of later complication, without mention of misadventure at the time of the procedure: Secondary | ICD-10-CM | POA: Insufficient documentation

## 2024-05-31 DIAGNOSIS — J3489 Other specified disorders of nose and nasal sinuses: Secondary | ICD-10-CM | POA: Insufficient documentation

## 2024-05-31 DIAGNOSIS — G9389 Other specified disorders of brain: Secondary | ICD-10-CM | POA: Diagnosis not present

## 2024-05-31 DIAGNOSIS — R220 Localized swelling, mass and lump, head: Secondary | ICD-10-CM | POA: Diagnosis not present

## 2024-05-31 DIAGNOSIS — I6381 Other cerebral infarction due to occlusion or stenosis of small artery: Secondary | ICD-10-CM | POA: Diagnosis not present

## 2024-06-02 ENCOUNTER — Other Ambulatory Visit: Payer: Self-pay | Admitting: Cardiology

## 2024-06-02 DIAGNOSIS — I480 Paroxysmal atrial fibrillation: Secondary | ICD-10-CM

## 2024-06-02 NOTE — Telephone Encounter (Signed)
 Warfarin 5mg  refill Afib Last INR 05/20/24 Last OV 02/04/24

## 2024-06-03 ENCOUNTER — Encounter: Payer: Self-pay | Admitting: Cardiology

## 2024-06-03 DIAGNOSIS — T847XXD Infection and inflammatory reaction due to other internal orthopedic prosthetic devices, implants and grafts, subsequent encounter: Secondary | ICD-10-CM | POA: Diagnosis not present

## 2024-06-03 DIAGNOSIS — Z6833 Body mass index (BMI) 33.0-33.9, adult: Secondary | ICD-10-CM | POA: Diagnosis not present

## 2024-06-07 ENCOUNTER — Ambulatory Visit

## 2024-06-13 NOTE — Progress Notes (Unsigned)
 Reason for Infectious Disease Consult: osteomyelitis of bone flap and sinusitis  Requesting Physicians: Suzen Pean, NP and Alm Molt, MD  PCP: Searcy Overcast, MD  Subjective:    Patient ID: [name] [name], female    DOB: 1946-06-14, 78 y.o.   MRN: [id number]  HPI  Past Medical History:  Diagnosis Date   Anemia    years ago after surgery   Atrial fibrillation (HCC)    Bronchitis    Dysrhythmia    A-fib   Elevated coronary artery calcium score 02/23/2024   FUO (fever of unknown origin) 03/03/2015   GERD (gastroesophageal reflux disease)    Heart murmur    Mild to moderate MR, moderate AR 03/16/24   Hepatitis 1980s   Hepatitis    non A- non B   HTN (hypertension)    Hypercholesterolemia    Night sweat 03/03/2015   PMR (polymyalgia rheumatica) (HCC) 03/15/2015   Polyarthritis 03/03/2015   Polymyalgia (HCC) 03/03/2015   Sleep apnea    Pt has Inspire Device    Past Surgical History:  Procedure Laterality Date   ABDOMINAL HYSTERECTOMY  1973   APPENDECTOMY  09/24/1971   APPLICATION OF CRANIAL NAVIGATION Right 02/05/2023   Procedure: APPLICATION OF CRANIAL NAVIGATION;  Surgeon: Molt Alm RAMAN, MD;  Location: [facility] OR;  Service: Neurosurgery;  Laterality: Right;   CHOLECYSTECTOMY N/A 10/08/2022   Procedure: LAPAROSCOPIC CHOLECYSTECTOMY;  Surgeon: Belinda Cough, MD;  Location: [facility] OR;  Service: General;  Laterality: N/A;   CRANIOTOMY Right 02/05/2023   Procedure: Craniotomy - right for meningioma - Frontal;  Surgeon: Molt Alm RAMAN, MD;  Location: [facility] OR;  Service: Neurosurgery;  Laterality: Right;   CRANIOTOMY Right 05/04/2024   Procedure: REMOVAL OF CRANIOTOMY BONE FLAP AND ABDOMINAL FAT GRAFT;  Surgeon: Molt Alm Hamilton, MD;  Location: [facility] OR;  Service: Neurosurgery;  Laterality: Right;  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   DRUG INDUCED ENDOSCOPY N/A 11/14/2021   Procedure: DRUG INDUCED SLEEP ENDOSCOPY;  Surgeon: Carlie Clark, MD;  Location: [facility];  Service: ENT;  Laterality: N/A;   FOOT SURGERY Left 09/24/2007   IMPLANTATION OF HYPOGLOSSAL NERVE STIMULATOR Right 12/18/2021   Procedure: IMPLANTATION OF HYPOGLOSSAL NERVE STIMULATOR;  Surgeon: Carlie Clark, MD;  Location: [facility];  Service: ENT;  Laterality: Right;   INTRAOPERATIVE CHOLANGIOGRAM N/A 10/08/2022   Procedure: INTRAOPERATIVE CHOLANGIOGRAM;  Surgeon: Belinda Cough, MD;  Location: [facility] OR;  Service: General;  Laterality: N/A;   KNEE ARTHROSCOPY  09/23/2005   right   OOPHORECTOMY Bilateral 2001   TUBAL LIGATION  09/23/1970    Family History  Problem Relation Age of Onset   Hypertension Sister    Lung cancer Father    Hypertension Mother    Multiple sclerosis Daughter    Breast cancer Other        maternal aunt   Allergies Daughter    Allergies Daughter    Allergies Son       Social History   Socioeconomic History   Marital status: Married    Spouse name: Not on file   Number of children: 3   Years of education: Not on file   Highest education level: Not on file  Occupational History   Occupation: RN    Employer: RETIRED  Tobacco Use   Smoking status: Former    Current packs/day: 0.00    Average packs/day: 0.2 packs/day for 5.0 years (1.0 ttl pk-yrs)    Types: Cigarettes    Start date:  09/24/1975    Quit date: 09/23/1980    Years since quitting: 43.7   Smokeless tobacco: Never  Vaping Use   Vaping status: Never Used  Substance and Sexual Activity   Alcohol  use: Yes    Alcohol /week: 14.0 - 21.0 standard drinks of alcohol     Types: 14 - 21 Glasses of wine per week   Drug use: No   Sexual activity: Not Currently    Birth control/protection: Surgical  Other Topics Concern   Not on file  Social History Narrative   Not on file   Social Drivers of Health   Financial Resource Strain: Not on file  Food Insecurity: No Food Insecurity (05/04/2024)   Hunger Vital Sign    Worried About Running Out of Food in the Last Year:  Never true    Ran Out of Food in the Last Year: Never true  Transportation Needs: No Transportation Needs (05/04/2024)   PRAPARE - Administrator, Civil Service (Medical): No    Lack of Transportation (Non-Medical): No  Physical Activity: Not on file  Stress: Not on file  Social Connections: Socially Integrated (05/04/2024)   Social Connection and Isolation Panel    Frequency of Communication with Friends and Family: More than three times a week    Frequency of Social Gatherings with Friends and Family: More than three times a week    Attends Religious Services: More than 4 times per year    Active Member of [employer] or Organizations: Yes    Attends Engineer, structural: More than 4 times per year    Marital Status: Married    Allergies  Allergen Reactions   Atorvastatin Other (See Comments)    Muscle cramps severe      Simvastatin Other (See Comments)    Severe muscle cramps   Cephalosporins     'ran a fever   Nickel Rash   Sulfa Antibiotics Hives   Sulfasalazine Hives   Dilaudid  [Hydromorphone ]    Eliquis [Apixaban] Hives and Itching   Xarelto  [Rivaroxaban ] Hives and Itching     Current Outpatient Medications:    albuterol  (PROAIR  HFA) 108 (90 Base) MCG/ACT inhaler, Inhale 2 puffs into the lungs every 6 (six) hours as needed., Disp: 1 Inhaler, Rfl: 5   b complex vitamins capsule, Take 1 capsule by mouth daily., Disp: , Rfl:    chlorthalidone  (HYGROTON ) 25 MG tablet, Take 1 tablet (25 mg total) by mouth daily., Disp: 90 tablet, Rfl: 1   diphenhydrAMINE  (BENADRYL ) 25 MG tablet, Take 25 mg by mouth at bedtime., Disp: , Rfl:    doxylamine, Sleep, (UNISOM) 25 MG tablet, Take 25 mg by mouth at bedtime., Disp: , Rfl:    estradiol (VIVELLE-DOT) 0.025 MG/24HR, Place 1 patch onto the skin 2 (two) times a week., Disp: , Rfl:    ezetimibe  (ZETIA ) 10 MG tablet, TAKE 1 TABLET BY MOUTH EVERY DAY, Disp: 90 tablet, Rfl: 3   fluticasone  (FLONASE ) 50 MCG/ACT nasal spray,  Place 2 sprays into both nostrils daily., Disp: , Rfl:    HYDROcodone -acetaminophen  (NORCO/VICODIN) 5-325 MG tablet, Take 1 tablet by mouth every 4 (four) hours as needed for moderate pain (pain score 4-6)., Disp: 30 tablet, Rfl: 0   irbesartan  (AVAPRO ) 300 MG tablet, Take 300 mg by mouth daily., Disp: , Rfl:    Magnesium  Oxide (MAG-OXIDE PO), Take 200 mg by mouth at bedtime., Disp: , Rfl:    montelukast  (SINGULAIR ) 10 MG tablet, Take 10 mg by mouth at bedtime.,  Disp: , Rfl:    Multiple Vitamin (MULTI-VITAMINS) TABS, Take 1 tablet by mouth daily. , Disp: , Rfl:    Multiple Vitamins-Minerals (OCUVITE EYE HEALTH FORMULA) CAPS, Take 1 tablet by mouth daily., Disp: , Rfl:    omeprazole (PRILOSEC) 20 MG capsule, Take 20 mg by mouth daily., Disp: , Rfl:    OVER THE COUNTER MEDICATION, Take 2 tablets by mouth at bedtime. Restful monk sleep supplement (without melatonin), Disp: , Rfl:    predniSONE  (DELTASONE ) 5 MG tablet, Take 5 mg by mouth daily with breakfast., Disp: , Rfl:    REPATHA SURECLICK 140 MG/ML SOAJ, Inject 140 mg into the skin every 14 (fourteen) days., Disp: , Rfl:    sotalol  (BETAPACE ) 80 MG tablet, Take 80 mg by mouth 2 (two) times daily., Disp: , Rfl:    traZODone (DESYREL) 50 MG tablet, Take 50 mg by mouth at bedtime., Disp: , Rfl:    TRYPTOPHAN  PO, Take 1 capsule by mouth at bedtime., Disp: , Rfl:    venlafaxine  XR (EFFEXOR -XR) 150 MG 24 hr capsule, Take 150 mg by mouth daily., Disp: , Rfl:    Vitamin D, Ergocalciferol, (DRISDOL) 1.25 MG (50000 UNIT) CAPS capsule, Take 50,000 Units by mouth every Monday., Disp: , Rfl:    warfarin (COUMADIN ) 5 MG tablet, TAKE 1 TABLET BY MOUTH IN THE AFTERNOON OR AS DIRECTED BY COUMADIN  CLINIC, Disp: 100 tablet, Rfl: 1    Review of Systems     Objective:   Physical Exam        Assessment & Plan:

## 2024-06-14 ENCOUNTER — Encounter: Payer: Self-pay | Admitting: Infectious Disease

## 2024-06-14 ENCOUNTER — Other Ambulatory Visit: Payer: Self-pay

## 2024-06-14 ENCOUNTER — Ambulatory Visit (INDEPENDENT_AMBULATORY_CARE_PROVIDER_SITE_OTHER): Admitting: Infectious Disease

## 2024-06-14 VITALS — BP 156/72 | HR 64 | Temp 97.7°F | Resp 16 | Wt 202.4 lb

## 2024-06-14 DIAGNOSIS — M353 Polymyalgia rheumatica: Secondary | ICD-10-CM | POA: Diagnosis not present

## 2024-06-14 DIAGNOSIS — J0140 Acute pansinusitis, unspecified: Secondary | ICD-10-CM

## 2024-06-14 DIAGNOSIS — J329 Chronic sinusitis, unspecified: Secondary | ICD-10-CM | POA: Insufficient documentation

## 2024-06-14 DIAGNOSIS — Z9889 Other specified postprocedural states: Secondary | ICD-10-CM | POA: Diagnosis not present

## 2024-06-14 DIAGNOSIS — D329 Benign neoplasm of meninges, unspecified: Secondary | ICD-10-CM

## 2024-06-14 DIAGNOSIS — M869 Osteomyelitis, unspecified: Secondary | ICD-10-CM

## 2024-06-14 MED ORDER — AMOXICILLIN-POT CLAVULANATE 875-125 MG PO TABS: ORAL_TABLET | ORAL | 0 refills | Status: AC

## 2024-06-15 ENCOUNTER — Ambulatory Visit: Attending: Cardiology

## 2024-06-15 DIAGNOSIS — I480 Paroxysmal atrial fibrillation: Secondary | ICD-10-CM

## 2024-06-15 DIAGNOSIS — Z7901 Long term (current) use of anticoagulants: Secondary | ICD-10-CM

## 2024-06-15 LAB — COMPLETE METABOLIC PANEL WITHOUT GFR
AG Ratio: 1.6 (calc) (ref 1.0–2.5)
ALT: 41 U/L — ABNORMAL HIGH (ref 6–29)
AST: 20 U/L (ref 10–35)
Albumin: 4.2 g/dL (ref 3.6–5.1)
Alkaline phosphatase (APISO): 82 U/L (ref 37–153)
BUN: 24 mg/dL (ref 7–25)
CO2: 27 mmol/L (ref 20–32)
Calcium: 9.1 mg/dL (ref 8.6–10.4)
Chloride: 98 mmol/L (ref 98–110)
Creat: 0.93 mg/dL (ref 0.60–0.95)
Globulin: 2.7 g/dL (ref 1.9–3.7)
Glucose, Bld: 141 mg/dL — ABNORMAL HIGH (ref 65–99)
Potassium: 3.7 mmol/L (ref 3.5–5.3)
Sodium: 136 mmol/L (ref 135–146)
Total Bilirubin: 0.6 mg/dL (ref 0.2–1.2)
Total Protein: 6.9 g/dL (ref 6.1–8.1)

## 2024-06-15 LAB — CBC WITH DIFFERENTIAL/PLATELET
Absolute Lymphocytes: 2020 {cells}/uL (ref 850–3900)
Absolute Monocytes: 683 {cells}/uL (ref 200–950)
Basophils Absolute: 64 {cells}/uL (ref 0–200)
Basophils Relative: 0.7 %
Eosinophils Absolute: 36 {cells}/uL (ref 15–500)
Eosinophils Relative: 0.4 %
HCT: 38.3 % (ref 35.0–45.0)
Hemoglobin: 12.9 g/dL (ref 11.7–15.5)
MCH: 33.5 pg — ABNORMAL HIGH (ref 27.0–33.0)
MCHC: 33.7 g/dL (ref 32.0–36.0)
MCV: 99.5 fL (ref 80.0–100.0)
MPV: 10.2 fL (ref 7.5–12.5)
Monocytes Relative: 7.5 %
Neutro Abs: 6297 {cells}/uL (ref 1500–7800)
Neutrophils Relative %: 69.2 %
Platelets: 187 Thousand/uL (ref 140–400)
RBC: 3.85 Million/uL (ref 3.80–5.10)
RDW: 14.4 % (ref 11.0–15.0)
Total Lymphocyte: 22.2 %
WBC: 9.1 Thousand/uL (ref 3.8–10.8)

## 2024-06-15 LAB — POCT INR: INR: 1.3 — AB (ref 2.0–3.0)

## 2024-06-15 LAB — C-REACTIVE PROTEIN: CRP: 5.3 mg/L (ref <8.0)

## 2024-06-15 LAB — SEDIMENTATION RATE: Sed Rate: 25 mm/h (ref 0–30)

## 2024-06-15 NOTE — Patient Instructions (Signed)
 Description   INR 1.3, Take 1.5 tablets today and tomorrow, then resume taking warfarin 1 tablet daily except 1/2 tablet on Mondays. Recheck in 2 weeks.  Anticoagulation Clinic [phone]

## 2024-06-15 NOTE — Progress Notes (Signed)
 Description   INR 1.3, Take 1.5 tablets today and tomorrow, then resume taking warfarin 1 tablet daily except 1/2 tablet on Mondays. Recheck in 2 weeks.  Anticoagulation Clinic [phone]

## 2024-06-18 ENCOUNTER — Other Ambulatory Visit (HOSPITAL_COMMUNITY)

## 2024-06-29 ENCOUNTER — Ambulatory Visit: Attending: Cardiology | Admitting: *Deleted

## 2024-06-29 DIAGNOSIS — Z7901 Long term (current) use of anticoagulants: Secondary | ICD-10-CM | POA: Diagnosis not present

## 2024-06-29 DIAGNOSIS — K080 Exfoliation of teeth due to systemic causes: Secondary | ICD-10-CM | POA: Diagnosis not present

## 2024-06-29 DIAGNOSIS — I480 Paroxysmal atrial fibrillation: Secondary | ICD-10-CM

## 2024-06-29 LAB — POCT INR: INR: 2.1 (ref 2.0–3.0)

## 2024-06-29 NOTE — Progress Notes (Signed)
 Description   INR-2.1; Continue taking warfarin 1 tablet daily except 1/2 tablet on Mondays. Recheck in 4 weeks. Anticoagulation Clinic [phone]

## 2024-06-29 NOTE — Patient Instructions (Signed)
 Description   INR-2.1; Continue taking warfarin 1 tablet daily except 1/2 tablet on Mondays. Recheck in 4 weeks. Anticoagulation Clinic [phone]

## 2024-07-07 DIAGNOSIS — M19012 Primary osteoarthritis, left shoulder: Secondary | ICD-10-CM | POA: Diagnosis not present

## 2024-07-07 DIAGNOSIS — M19011 Primary osteoarthritis, right shoulder: Secondary | ICD-10-CM | POA: Diagnosis not present

## 2024-07-08 ENCOUNTER — Telehealth: Payer: Self-pay | Admitting: Pulmonary Disease

## 2024-07-08 NOTE — Telephone Encounter (Signed)
 Fax received from Dr. Alm Molt with [facility] to perform a right frontal cranioplasty on patient.  Patient needs surgery clearance. Surgery is TBD. Patient was seen on 03/25/24. Office protocol is a risk assessment can be sent to surgeon if patient has been seen in 60 days or less.   She has upcoming visit with Dr. Jude. Will add to appt notes she needs risk assessment and will route to clearance pool until the visit is complete.

## 2024-07-09 ENCOUNTER — Other Ambulatory Visit: Payer: Self-pay | Admitting: Neurological Surgery

## 2024-07-14 ENCOUNTER — Ambulatory Visit: Payer: Self-pay | Admitting: Infectious Diseases

## 2024-07-16 NOTE — Pre-Procedure Instructions (Signed)
 Surgical Instructions   Your procedure is scheduled on July 28, 2024. Report to [facility] Main Entrance A at 11:00 A.M., then check in with the Admitting office. Any questions or running late day of surgery: call [phone]  Questions prior to your surgery date: call [phone], Monday-Friday, 8am-4pm. If you experience any cold or flu symptoms such as cough, fever, chills, shortness of breath, etc. between now and your scheduled surgery, please notify us  at the above number.     Remember:  Do not eat after midnight the night before your surgery   You may drink clear liquids until 10:00 AM the morning of your surgery.   Clear liquids allowed are: Water, Non-Citrus Juices (without pulp), Carbonated Beverages, Clear Tea (no milk, honey, etc.), Black Coffee Only (NO MILK, CREAM OR POWDERED CREAMER of any kind), and Gatorade.    Take these medicines the morning of surgery with A SIP OF WATER: amoxicillin -clavulanate (AUGMENTIN )  colesevelam [facility])  ezetimibe  (ZETIA )  fluticasone  (FLONASE ) nasal spray  omeprazole (PRILOSEC)  predniSONE  (DELTASONE )  sotalol  (BETAPACE )  venlafaxine  XR (EFFEXOR -XR)    Please contact your prescribing physician's office regarding warfarin (COUMADIN ) instructions.   One week prior to surgery, STOP taking any Aspirin (unless otherwise instructed by your surgeon) Aleve, Naproxen, Ibuprofen , Motrin , Advil , Goody's, BC's, all herbal medications, fish oil, and non-prescription vitamins.                     Do NOT Smoke (Tobacco/Vaping) for 24 hours prior to your procedure.  If you use a CPAP at night, you may bring your mask/headgear for your overnight stay.   You will be asked to remove any contacts, glasses, piercing's, hearing aid's, dentures/partials prior to surgery. Please bring cases for these items if needed.    Patients discharged the day of surgery will not be allowed to drive home, and someone needs to stay with them for 24  hours.  SURGICAL WAITING ROOM VISITATION Patients may have no more than 2 support people in the waiting area - these visitors may rotate.   Pre-op nurse will coordinate an appropriate time for 1 ADULT support person, who may not rotate, to accompany patient in pre-op.  Children under the age of 38 must have an adult with them who is not the patient and must remain in the main waiting area with an adult.  If the patient needs to stay at the hospital during part of their recovery, the visitor guidelines for inpatient rooms apply.  Please refer to the [facility] website for the visitor guidelines for any additional information.   If you received a COVID test during your pre-op visit  it is requested that you wear a mask when out in public, stay away from anyone that may not be feeling well and notify your surgeon if you develop symptoms. If you have been in contact with anyone that has tested positive in the last 10 days please notify you surgeon.      Pre-operative CHG Bathing Instructions   You can play a key role in reducing the risk of infection after surgery. Your skin needs to be as free of germs as possible. You can reduce the number of germs on your skin by washing with CHG (chlorhexidine  gluconate) soap before surgery. CHG is an antiseptic soap that kills germs and continues to kill germs even after washing.   DO NOT use if you have an allergy  to chlorhexidine /CHG or antibacterial soaps. If your skin becomes reddened or  irritated, stop using the CHG and notify one of our RNs at [phone].              TAKE A SHOWER THE NIGHT BEFORE SURGERY   Please keep in mind the following:  DO NOT shave, including legs and underarms, 48 hours prior to surgery.   You may shave your face before/day of surgery.  Place clean sheets on your bed the night before surgery Use a clean washcloth (not used since being washed) for shower. DO NOT sleep with pet's night before surgery.  CHG Shower  Instructions:  Wash your face and private area with normal soap. If you choose to wash your hair, wash first with your normal shampoo.  After you use shampoo/soap, rinse your hair and body thoroughly to remove shampoo/soap residue.  Turn the water OFF and apply half the bottle of CHG soap to a CLEAN washcloth.  Apply CHG soap ONLY FROM YOUR NECK DOWN TO YOUR TOES (washing for 3-5 minutes)  DO NOT use CHG soap on face, private areas, open wounds, or sores.  Pay special attention to the area where your surgery is being performed.  If you are having back surgery, having someone wash your back for you may be helpful. Wait 2 minutes after CHG soap is applied, then you may rinse off the CHG soap.  Pat dry with a clean towel  Put on clean pajamas    Additional instructions for the day of surgery: If you choose, you may shower the morning of surgery with an antibacterial soap.  DO NOT APPLY any lotions, deodorants, cologne, or perfumes.   Do not wear jewelry or makeup Do not wear nail polish, gel polish, artificial nails, or any other type of covering on natural nails (fingers and toes) Do not bring valuables to the hospital. [facility] is not responsible for valuables/personal belongings. Put on clean/comfortable clothes.  Please brush your teeth.  Ask your nurse before applying any prescription medications to the skin.

## 2024-07-19 ENCOUNTER — Encounter (HOSPITAL_COMMUNITY): Payer: Self-pay

## 2024-07-19 ENCOUNTER — Encounter (HOSPITAL_COMMUNITY)
Admission: RE | Admit: 2024-07-19 | Discharge: 2024-07-19 | Disposition: A | Discharge status: Home / Self Care | Source: Ambulatory Visit | Attending: Neurological Surgery | Admitting: Neurological Surgery

## 2024-07-19 ENCOUNTER — Other Ambulatory Visit: Payer: Self-pay

## 2024-07-19 VITALS — BP 191/69 | HR 62 | Temp 97.6°F | Resp 17 | Ht 64.0 in | Wt 200.7 lb

## 2024-07-19 DIAGNOSIS — X58XXXD Exposure to other specified factors, subsequent encounter: Secondary | ICD-10-CM | POA: Diagnosis not present

## 2024-07-19 DIAGNOSIS — M353 Polymyalgia rheumatica: Secondary | ICD-10-CM | POA: Diagnosis not present

## 2024-07-19 DIAGNOSIS — Z7952 Long term (current) use of systemic steroids: Secondary | ICD-10-CM | POA: Diagnosis not present

## 2024-07-19 DIAGNOSIS — Z8619 Personal history of other infectious and parasitic diseases: Secondary | ICD-10-CM | POA: Insufficient documentation

## 2024-07-19 DIAGNOSIS — M1991 Primary osteoarthritis, unspecified site: Secondary | ICD-10-CM | POA: Diagnosis not present

## 2024-07-19 DIAGNOSIS — I10 Essential (primary) hypertension: Secondary | ICD-10-CM | POA: Insufficient documentation

## 2024-07-19 DIAGNOSIS — E7800 Pure hypercholesterolemia, unspecified: Secondary | ICD-10-CM | POA: Diagnosis not present

## 2024-07-19 DIAGNOSIS — Z9889 Other specified postprocedural states: Secondary | ICD-10-CM | POA: Insufficient documentation

## 2024-07-19 DIAGNOSIS — Z86011 Personal history of benign neoplasm of the brain: Secondary | ICD-10-CM | POA: Diagnosis not present

## 2024-07-19 DIAGNOSIS — Z87891 Personal history of nicotine dependence: Secondary | ICD-10-CM | POA: Diagnosis not present

## 2024-07-19 DIAGNOSIS — G4733 Obstructive sleep apnea (adult) (pediatric): Secondary | ICD-10-CM | POA: Diagnosis not present

## 2024-07-19 DIAGNOSIS — I480 Paroxysmal atrial fibrillation: Secondary | ICD-10-CM | POA: Diagnosis not present

## 2024-07-19 DIAGNOSIS — I2510 Atherosclerotic heart disease of native coronary artery without angina pectoris: Secondary | ICD-10-CM | POA: Insufficient documentation

## 2024-07-19 DIAGNOSIS — M79644 Pain in right finger(s): Secondary | ICD-10-CM | POA: Diagnosis not present

## 2024-07-19 DIAGNOSIS — E119 Type 2 diabetes mellitus without complications: Secondary | ICD-10-CM | POA: Diagnosis not present

## 2024-07-19 DIAGNOSIS — Z7901 Long term (current) use of anticoagulants: Secondary | ICD-10-CM | POA: Insufficient documentation

## 2024-07-19 DIAGNOSIS — T847XXD Infection and inflammatory reaction due to other internal orthopedic prosthetic devices, implants and grafts, subsequent encounter: Secondary | ICD-10-CM | POA: Diagnosis not present

## 2024-07-19 DIAGNOSIS — Z01812 Encounter for preprocedural laboratory examination: Secondary | ICD-10-CM | POA: Insufficient documentation

## 2024-07-19 DIAGNOSIS — Z01818 Encounter for other preprocedural examination: Secondary | ICD-10-CM

## 2024-07-19 LAB — CBC
HCT: 40.8 % (ref 36.0–46.0)
Hemoglobin: 14 g/dL (ref 12.0–15.0)
MCH: 32.6 pg (ref 26.0–34.0)
MCHC: 34.3 g/dL (ref 30.0–36.0)
MCV: 95.1 fL (ref 80.0–100.0)
Platelets: 178 K/uL (ref 150–400)
RBC: 4.29 MIL/uL (ref 3.87–5.11)
RDW: 13.6 % (ref 11.5–15.5)
WBC: 12.3 K/uL — ABNORMAL HIGH (ref 4.0–10.5)
nRBC: 0 % (ref 0.0–0.2)

## 2024-07-19 LAB — COMPREHENSIVE METABOLIC PANEL WITH GFR
ALT: 21 U/L (ref 0–44)
AST: 23 U/L (ref 15–41)
Albumin: 3.8 g/dL (ref 3.5–5.0)
Alkaline Phosphatase: 66 U/L (ref 38–126)
Anion gap: 14 (ref 5–15)
BUN: 24 mg/dL — ABNORMAL HIGH (ref 8–23)
CO2: 25 mmol/L (ref 22–32)
Calcium: 9.1 mg/dL (ref 8.9–10.3)
Chloride: 93 mmol/L — ABNORMAL LOW (ref 98–111)
Creatinine, Ser: 1.08 mg/dL — ABNORMAL HIGH (ref 0.44–1.00)
GFR, Estimated: 52 mL/min — ABNORMAL LOW (ref >60)
Glucose, Bld: 123 mg/dL — ABNORMAL HIGH (ref 70–99)
Potassium: 3.8 mmol/L (ref 3.5–5.1)
Sodium: 132 mmol/L — ABNORMAL LOW (ref 135–145)
Total Bilirubin: 1.1 mg/dL (ref 0.0–1.2)
Total Protein: 7.3 g/dL (ref 6.5–8.1)

## 2024-07-19 LAB — TYPE AND SCREEN
ABO/RH(D): O POS
Antibody Screen: NEGATIVE

## 2024-07-19 NOTE — Progress Notes (Signed)
 PCP - Dr. Searcy Overcast Cardiologist - Dr. Ole Holts - last office visit 02/04/2024 Pulmonologist - Dr. Harden Staff - last office visit 03/25/2024  PPM/ICD - Denies Device Orders - n/a Rep Notified - n/a  Chest x-ray - n/a EKG - 05/20/2024 Stress Test - Denies ECHO - 03/16/2024 Cardiac Cath - Denies  Sleep Study - +OSA. Pt has Doctor, Hospital. Pt instructed to bring device remote with her day of surgery.  No DM  Last dose of GLP1 agonist- n/a GLP1 instructions: n/a  Blood Thinner Instructions: Pt instructed to stop Warfarin 5 days prior to surgery. She has already stopped it with last dose on 10/24 Aspirin Instructions: n/a  ERAS Protcol - Clear liquids until 1000 morning of surgery PRE-SURGERY Ensure or G2- n/a  COVID TEST- n/a   Anesthesia review: Yes. Hx of HTN, A. Fib on Warfarin, OSA, CAD. Chart previously reviewed by Allison Zelenak, PA-C for surgery in August 2025.   Patient denies shortness of breath, fever, cough and chest pain at PAT appointment. Pt denies any current symptoms of sinus infection, but she continues to take Augmentin  as prescribed by Infectious Disease (sinus infection is what ultimately caused cranial bone flap infection).   All instructions explained to the patient, with a verbal understanding of the material. Patient agrees to go over the instructions while at home for a better understanding. Patient also instructed to self quarantine after being tested for COVID-19. The opportunity to ask questions was provided.

## 2024-07-20 NOTE — Anesthesia Preprocedure Evaluation (Addendum)
 Anesthesia Evaluation  Patient identified by MRN, date of birth, ID band Patient awake    Reviewed: Allergy  & Precautions, H&P , NPO status , Patient's Chart, lab work & pertinent test results  Airway Mallampati: III  TM Distance: >3 FB Neck ROM: Full    Dental  (+) Dental Advisory Given, Teeth Intact ANTERIOR:   Pulmonary asthma , sleep apnea , former smoker   Pulmonary exam normal breath sounds clear to auscultation       Cardiovascular Exercise Tolerance: Good hypertension, Pt. on medications + CAD  + dysrhythmias Atrial Fibrillation + Valvular Problems/Murmurs AI and MR  Rhythm:Regular Rate:Normal + Systolic murmurs Echo 03/16/24: 1. Left ventricular ejection fraction, by estimation, is 60 to 65%. The left ventricle has normal function. The left ventricle has no regional wall motion abnormalities. Left ventricular diastolic parameters are consistent with Grade I diastolic dysfunction (impaired relaxation).  2. Right ventricular systolic function is normal. The right ventricular size is normal.  3. Left atrial size was mildly dilated.  4. Right atrial size was mildly dilated.  5. The mitral valve is abnormal. Mild to moderate mitral valve regurgitation. No evidence of mitral stenosis. There is mild late systolic prolapse of both leaflets of the mitral valve.  6. The aortic valve is tricuspid. There is mild calcification of the aortic valve. Aortic valve regurgitation is moderate. No aortic stenosis is present.  7. The inferior vena cava is normal in size with greater than 50% respiratory variability, suggesting right atrial pressure of 3 mmHg.    CT Cardiac Morph/Pulm Vein (pre-Watchman) 02/23/24:  1. There is no thrombus in the left atrial appendage. LAA landing zone measures 22mm x 20mm in diameter with depth 15mm, appropriate for a 27mm FLX device 2. Coronary calcium score 99 (51st percentile). Appears at least moderate  stenosis in proximal LAD, but study was performed without use of NTG and insufficient for plaque evaluation; recommend coronary CTA to evaluate for obstructive CAD - Comparison CT CAC 07/03/21: CAC 49.7 (44th percentile), aortic atherosclerosis, small-moderate hiatal hernia     Neuro/Psych  Neuromuscular disease negative neurological ROS  negative psych ROS   GI/Hepatic negative GI ROS, Neg liver ROS,GERD  Medicated,,(+) Hepatitis -  Endo/Other  negative endocrine ROSdiabetes    Renal/GU negative Renal ROS  negative genitourinary   Musculoskeletal  (+) Arthritis ,    Abdominal  (+) + obese  Peds  Hematology negative hematology ROS (+) Blood dyscrasia, anemia   Anesthesia Other Findings   Reproductive/Obstetrics negative OB ROS                              Anesthesia Physical Anesthesia Plan  ASA: 3  Anesthesia Plan: General   Post-op Pain Management: Minimal or no pain anticipated   Induction: Intravenous  PONV Risk Score and Plan: 3 and Ondansetron , Dexamethasone  and Treatment may vary due to age or medical condition  Airway Management Planned: Oral ETT and Video Laryngoscope Planned  Additional Equipment: None  Intra-op Plan:   Post-operative Plan: Extubation in OR  Informed Consent: I have reviewed the patients History and Physical, chart, labs and discussed the procedure including the risks, benefits and alternatives for the proposed anesthesia with the patient or authorized representative who has indicated his/her understanding and acceptance.     Dental advisory given  Plan Discussed with: CRNA  Anesthesia Plan Comments: (PAT note written 07/20/2024 by Allison Zelenak, PA-C. For PT/INR on the day of surgery.  DISCUSSION: Patient is an 11 female scheduled for the above procedure. 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     Cultures were negative. ID, neurosurgery, and ENT have been following. ID recommended 6 weeks of antibiotics before surgery in hopes to avoid a new flap infection.    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   She is s/p right frontal craniotomy for resection of a large meningioma on 02/05/2023.  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 Imaging in May 2025 concerning for infected bone flap. S/p /p right frontal craniectomy with exoneration of the right frontal sinus and coverage with a paracranial graft on 05/04/2024 by Dr. Joshua. Cultures were negative. ID, neurosurgery, and ENT have been following. Now scheduled for right cranioplasty. ID recommended 6 weeks of antibiotics before surgery in hopes to avoid a new flap infection. She has been on Augmentin .  She denied acute sick symptoms.   She is followed by Dr. Jordan and Dr. Cindie for elevated CAC and afib. She was being considered for Watchman device (see CV testing below), but then referred to neurosurgery by ENT for concern of infected bone flap. She had preoperative cardiology evaluation on 04/26/24 by Emelia Hazy, NP before her right craniectomy. She was felt to be acceptable risk for that procedure without additional CV testing. She was given  permission to hold warfarin for 5 days then without a Lovenox bridge. Of note, she did contact Dr. Jordan on 05/21/2024 after she went to the ED for HR 150's with suspected breakthrough with RVR, but was back in SR per EKG. Troponin mildly elevated, flat and cardiology suspected related to demand ischemia. She had no chest pain, SOB or near syncope. Was already on sotalol  and warfarin for know PAF. Her next routine cardiology visit is scheduled for 08/04/2024.   OSA is followed by pulmonologist Dr. Jude. She is s/p Inspire in [place] 2023.  She will bring the [facility] remote for surgery.  Her next visit is scheduled for 09/09/2024.   She is on chronic prednisone  5 mg daily for polymyalgia rheumatica.   Last warfarin reported as 07/16/2024.   EKG:  EKG 05/20/2024: Sinus rhythm at 95 bpm Consider left atrial enlargement Borderline T abnormalities, anterior leads No significant change since last tracing Confirmed by Patt Alm DEL [phone]) on 05/21/2024 10:34:22 PM     Echo 03/16/2024: IMPRESSIONS   1. Left ventricular ejection fraction, by estimation, is 60 to 65%. The  left ventricle has normal function. The left ventricle has no regional  wall motion abnormalities. Left ventricular diastolic parameters are  consistent with Grade I diastolic  dysfunction (impaired relaxation).   2. Right ventricular systolic function is normal. The right ventricular  size is normal.   3. Left atrial size was mildly dilated.   4. Right atrial size was mildly dilated.   5. The mitral  valve is abnormal. Mild to moderate mitral valve  regurgitation. No evidence of mitral stenosis. There is mild late systolic  prolapse of both leaflets of the mitral valve.   6. The aortic valve is tricuspid. There is mild calcification of the  aortic valve. Aortic valve regurgitation is moderate. No aortic stenosis  is present.   7. The inferior vena cava is normal in size with greater than 50%  respiratory variability, suggesting  right atrial pressure of 3 mmHg.      CT Cardiac Morph/Pulm Vein (pre-Watchman) 02/23/2024: IMPRESSION: 1. There is no thrombus in the left atrial appendage. LAA landing zone measures 22mm x 20mm in diameter with depth 15mm, appropriate for a 27mm FLX device 2. Coronary calcium score 99 (51st percentile). Appears at least moderate stenosis in proximal LAD, but study was performed without use of NTG and insufficient for plaque evaluation; recommend coronary CTA to evaluate for obstructive CAD - Comparison CT CAC 07/03/21: CAC 49.7 (44th percentile), aortic atherosclerosis, small-moderate hiatal hernia   )        Anesthesia Quick Evaluation

## 2024-07-20 NOTE — Progress Notes (Signed)
 Anesthesia Chart Review:  Case: [id number] Date/Time: 07/28/24 1252   Procedure: CRANIOPLASTY (Right) - Cranioplasty, right frontal   Anesthesia type: General   Diagnosis: Infection of bone flap, subsequent encounter [T84.7XXD]   Pre-op diagnosis: Infection of bone flap   [facility] ROOM 20 / [facility]   Surgeons: Joshua Alm Hamilton, MD       DISCUSSION: Patient is an 62 female scheduled for the above procedure. 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    Cultures were negative. ID, neurosurgery, and ENT have been following. ID recommended 6 weeks of antibiotics before surgery in hopes to avoid a new flap infection.    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   She is s/p right frontal craniotomy for resection of a large meningioma on 02/05/2023.  She had issues with chronic pansinusitis and frontal mucocele and was referred to [facility] Bard Ill, MD. Imaging in May 2025 concerning for infected bone flap. S/p /p right frontal craniectomy with exoneration of the right frontal sinus and coverage with a paracranial graft on 05/04/2024 by Dr. Joshua. Cultures were negative. ID, neurosurgery, and ENT have been following. Now scheduled for right cranioplasty. ID  recommended 6 weeks of antibiotics before surgery in hopes to avoid a new flap infection. She has been on Augmentin .  She denied acute sick symptoms.  She is followed by Dr. Jordan and Dr. Cindie for elevated CAC and afib. She was being considered for Watchman device (see CV testing below), but then referred to neurosurgery by ENT for concern of infected bone flap. She had preoperative cardiology evaluation on 04/26/24 by Emelia Hazy, NP before her right craniectomy. She was felt to be acceptable risk for that procedure without additional CV testing. She was given permission to hold warfarin for 5 days then without a Lovenox bridge. Of note, she did contact Dr. Jordan on 05/21/2024 after she went to the ED for HR 150's with suspected breakthrough with RVR, but was back in SR per EKG. Troponin mildly elevated, flat and cardiology suspected related to demand ischemia. She had no chest pain, SOB or near syncope. Was already on sotalol  and warfarin for know PAF. Her next routine cardiology visit is scheduled for 08/04/2024.  OSA is followed by pulmonologist Dr. Jude. She is s/p Inspire in [place] 2023.  She will bring the [facility] remote for surgery.  Her next visit is scheduled for 09/09/2024.  She is on chronic prednisone  5 mg daily for polymyalgia rheumatica.  Last warfarin reported as 07/16/2024.  Anesthesia team to evaluate on the day of surgery.   VS: BP (!) 191/69   Pulse 62   Temp 36.4 C   Resp 17   Ht 5' 4 (1.626 m)   Wt 91 kg   SpO2 99%   BMI 34.45 kg/m  BP Readings from Last 3 Encounters:  07/19/24 (!) 191/69  06/14/24 (!) 156/72  05/20/24 (!) 148/60  PROVIDERS: Avva, Ravisankar, MD is PCP  Jude Donning, MD is pulmonologist, last visit 03/25/24.  Mai Agent, MD is rheumatologist Jordan, Peter, MD is cardiologist Cindie Smalls, MD is EP cardiologist, last visit 02/04/24. She may be considered for Watchman Device in the future. Carlie Clark, MD ([facility]) and Loria Blamer, MD ([facility]) are her ENTs Fleeta Rothman, Jomarie, MD is ID   LABS: Labs reviewed: Acceptable for surgery. (all labs ordered are listed, but only abnormal results are displayed)  Labs Reviewed  COMPREHENSIVE METABOLIC PANEL WITH GFR - Abnormal; Notable for the following components:      Result Value   Sodium 132 (*)    Chloride 93 (*)    Glucose, Bld 123 (*)    BUN 24 (*)    Creatinine, Ser 1.08 (*)    GFR, Estimated 52 (*)    All other components within normal limits  CBC - Abnormal; Notable for the following components:   WBC 12.3 (*)    All other components within normal limits  TYPE AND SCREEN    OTHER: Home Sleep Study 04/20/2024: Patient demonstrated a mild level of abnormal breathing events with clinically significant levels of oxygen desaturation.  Study performed on inspire settings 0.9 V.  Oxygen level at or under 88% for 2% of the time.  Minimum oxygen desaturation was 85%.  The AHI 4% was 8.6/h; this index includes hypopneas that exhibit oxygen desaturations of 4% or greater, and all apneas.  Home Sleep Study 09/04/2022: Per Dr. Jude, HST showed AHI of 8/hour, events predominantly in supine sleep, low saturation of 88% On inspire set is 1.2     IMAGES: CT Head 05/31/2024: IMPRESSION: 1. Interval postsurgical changes of right frontal craniectomy with underlying dural thickening/mineralization and encephalomalacia in the anterior right frontal lobe. 2. Possible extraaxial fluid collection at the craniectomy site. Consider MRI head with and without contrast for further evaluation. 3. Focal soft tissue swelling overlying the midline frontal bones adjacent to the craniectomy site. 4. Near complete opacification of the right frontal sinus with slightly improved aeration of the outflow tract compared to prior. Additional paranasal sinus disease as above, decreased since 03/23/24. 5. Chronic small vessel disease.  CT Chest (over read CT cardiac morph)  02/23/24: IMPRESSION: No significant extracardiac findings within the visualized chest.     EKG:  EKG 05/20/2024: Sinus rhythm at 95 bpm Consider left atrial enlargement Borderline T abnormalities, anterior leads No significant change since last tracing Confirmed by Patt Alm DEL [phone]) on 05/21/2024 10:34:22 PM    CV: Echo 03/16/2024: IMPRESSIONS   1. Left ventricular ejection fraction, by estimation, is 60 to 65%. The  left ventricle has normal function. The left ventricle has no regional  wall motion abnormalities. Left ventricular diastolic parameters are  consistent with Grade I diastolic  dysfunction (impaired relaxation).   2. Right ventricular systolic function is normal. The right ventricular  size is normal.   3. Left atrial size was mildly dilated.   4. Right atrial size was mildly dilated.   5. The mitral valve is abnormal. Mild to moderate mitral valve  regurgitation. No evidence of mitral stenosis. There is mild late systolic  prolapse of both leaflets of the mitral valve.   6. The aortic valve is tricuspid. There is mild calcification of the  aortic valve. Aortic valve regurgitation is moderate. No aortic stenosis  is present.   7. The inferior vena cava is normal in size with greater than 50%  respiratory  variability, suggesting right atrial pressure of 3 mmHg.      CT Cardiac Morph/Pulm Vein (pre-Watchman) 02/23/2024: IMPRESSION: 1. There is no thrombus in the left atrial appendage. LAA landing zone measures 22mm x 20mm in diameter with depth 15mm, appropriate for a 27mm FLX device 2. Coronary calcium score 99 (51st percentile). Appears at least moderate stenosis in proximal LAD, but study was performed without use of NTG and insufficient for plaque evaluation; recommend coronary CTA to evaluate for obstructive CAD - Comparison CT CAC 07/03/21: CAC 49.7 (44th percentile), aortic atherosclerosis, small-moderate hiatal hernia     Long term monitor  04/25/2019-05/08/2019: Normal sinus rhythm Rare PVCs, trigeminy One 6 beat run of NSVT Rare PACs. 6 runs of SVT. longest 18 seconds at rate max 152. symptomatic.  Past Medical History:  Diagnosis Date   Anemia    years ago after surgery   Atrial fibrillation (HCC)    Bronchitis    Dysrhythmia    A-fib   Elevated coronary artery calcium score 02/23/2024   FUO (fever of unknown origin) 03/03/2015   GERD (gastroesophageal reflux disease)    Heart murmur    Mild to moderate MR, moderate AR 03/16/24   Hepatitis 1980s   Hepatitis    non A- non B   HTN (hypertension)    Hypercholesterolemia    Night sweat 03/03/2015   PMR (polymyalgia rheumatica) 03/15/2015   Polyarthritis 03/03/2015   Polymyalgia 03/03/2015   Sinusitis 06/14/2024   Sleep apnea    Pt has Inspire Device    Past Surgical History:  Procedure Laterality Date   ABDOMINAL HYSTERECTOMY  1973   APPENDECTOMY  09/24/1971   APPLICATION OF CRANIAL NAVIGATION Right 02/05/2023   Procedure: APPLICATION OF CRANIAL NAVIGATION;  Surgeon: Joshua Alm RAMAN, MD;  Location[facility] OR;  Service: Neurosurgery;  Laterality: Right;   CHOLECYSTECTOMY N/A 10/08/2022   Procedure: LAPAROSCOPIC CHOLECYSTECTOMY;  Surgeon: Belinda Cough, MD;  Location: [facility] OR;  Service: General;  Laterality: N/A;   CRANIOTOMY Right 02/05/2023   Procedure: Craniotomy - right for meningioma - Frontal;  Surgeon: Joshua Alm RAMAN, MD;  Location: [facility] OR;  Service: Neurosurgery;  Laterality: Right;   CRANIOTOMY Right 05/04/2024   Procedure: REMOVAL OF CRANIOTOMY BONE FLAP AND ABDOMINAL FAT GRAFT;  Surgeon: Joshua Alm Hamilton, MD;  Location: [facility] OR;  Service: Neurosurgery;  Laterality: Right;  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   DRUG INDUCED ENDOSCOPY N/A 11/14/2021   Procedure: DRUG INDUCED SLEEP ENDOSCOPY;  Surgeon: Carlie Clark, MD;  Location: [facility];  Service: ENT;  Laterality: N/A;   FOOT SURGERY Left 09/24/2007   IMPLANTATION OF  HYPOGLOSSAL NERVE STIMULATOR Right 12/18/2021   Procedure: IMPLANTATION OF HYPOGLOSSAL NERVE STIMULATOR;  Surgeon: Carlie Clark, MD;  Location: [facility];  Service: ENT;  Laterality: Right;   INTRAOPERATIVE CHOLANGIOGRAM N/A 10/08/2022   Procedure: INTRAOPERATIVE CHOLANGIOGRAM;  Surgeon: Belinda Cough, MD;  Location: [facility] OR;  Service: General;  Laterality: N/A;   KNEE ARTHROSCOPY  09/23/2005   right   OOPHORECTOMY Bilateral 2001   TUBAL LIGATION  09/23/1970    MEDICATIONS:  albuterol  (PROAIR  HFA) 108 (90 Base) MCG/ACT inhaler   amoxicillin -clavulanate (AUGMENTIN ) 875-125 MG tablet   b complex vitamins capsule   chlorthalidone  (HYGROTON ) 25 MG tablet   colesevelam (WELCHOL) 625 MG tablet   diphenhydrAMINE  (BENADRYL ) 25 MG tablet   doxylamine, Sleep, (UNISOM) 25 MG tablet   ezetimibe  (ZETIA ) 10 MG tablet   fluticasone  (FLONASE ) 50 MCG/ACT nasal spray  HYDROcodone -acetaminophen  (NORCO/VICODIN) 5-325 MG tablet   irbesartan  (AVAPRO ) 300 MG tablet   Magnesium  Oxide -Mg Supplement 200 MG TABS   montelukast  (SINGULAIR ) 10 MG tablet   Multiple Vitamin (MULTI-VITAMINS) TABS   Multiple Vitamins-Minerals (OCUVITE EYE HEALTH FORMULA) CAPS   omeprazole (PRILOSEC) 20 MG capsule   OVER THE COUNTER MEDICATION   predniSONE  (DELTASONE ) 5 MG tablet   REPATHA SURECLICK 140 MG/ML SOAJ   sotalol  (BETAPACE ) 80 MG tablet   traZODone (DESYREL) 50 MG tablet   TRYPTOPHAN  PO   venlafaxine  XR (EFFEXOR -XR) 150 MG 24 hr capsule   Vitamin D, Ergocalciferol, (DRISDOL) 1.25 MG (50000 UNIT) CAPS capsule   warfarin (COUMADIN ) 5 MG tablet   No current facility-administered medications for this encounter.    Isaiah Ruder, PA-C Surgical Short Stay/Anesthesiology [facility] Phone [phone] [facility] Phone [phone] 07/20/2024 4:30 PM

## 2024-07-27 ENCOUNTER — Encounter

## 2024-07-28 ENCOUNTER — Inpatient Hospital Stay (HOSPITAL_COMMUNITY)
Admission: RE | Admit: 2024-07-28 | Discharge: 2024-07-29 | DRG: 983 | Disposition: A | Discharge status: Home / Self Care | Attending: Neurological Surgery | Admitting: Neurological Surgery

## 2024-07-28 ENCOUNTER — Encounter (HOSPITAL_COMMUNITY): Payer: Self-pay | Admitting: Neurological Surgery

## 2024-07-28 ENCOUNTER — Inpatient Hospital Stay (HOSPITAL_COMMUNITY): Payer: Self-pay | Admitting: Vascular Surgery

## 2024-07-28 ENCOUNTER — Other Ambulatory Visit: Payer: Self-pay

## 2024-07-28 ENCOUNTER — Inpatient Hospital Stay (HOSPITAL_COMMUNITY): Payer: Self-pay

## 2024-07-28 ENCOUNTER — Encounter (HOSPITAL_COMMUNITY): Admission: RE | Disposition: A | Payer: Self-pay | Source: Home / Self Care | Attending: Neurological Surgery

## 2024-07-28 DIAGNOSIS — I10 Essential (primary) hypertension: Secondary | ICD-10-CM | POA: Diagnosis present

## 2024-07-28 DIAGNOSIS — E7800 Pure hypercholesterolemia, unspecified: Secondary | ICD-10-CM | POA: Diagnosis not present

## 2024-07-28 DIAGNOSIS — I2510 Atherosclerotic heart disease of native coronary artery without angina pectoris: Secondary | ICD-10-CM

## 2024-07-28 DIAGNOSIS — Z7901 Long term (current) use of anticoagulants: Secondary | ICD-10-CM

## 2024-07-28 DIAGNOSIS — M952 Other acquired deformity of head: Secondary | ICD-10-CM | POA: Diagnosis not present

## 2024-07-28 DIAGNOSIS — Z86011 Personal history of benign neoplasm of the brain: Secondary | ICD-10-CM | POA: Diagnosis not present

## 2024-07-28 DIAGNOSIS — Z9049 Acquired absence of other specified parts of digestive tract: Secondary | ICD-10-CM

## 2024-07-28 DIAGNOSIS — Z87891 Personal history of nicotine dependence: Secondary | ICD-10-CM

## 2024-07-28 DIAGNOSIS — M130 Polyarthritis, unspecified: Secondary | ICD-10-CM | POA: Diagnosis not present

## 2024-07-28 DIAGNOSIS — Z7952 Long term (current) use of systemic steroids: Secondary | ICD-10-CM

## 2024-07-28 DIAGNOSIS — I080 Rheumatic disorders of both mitral and aortic valves: Secondary | ICD-10-CM | POA: Diagnosis not present

## 2024-07-28 DIAGNOSIS — Z91048 Other nonmedicinal substance allergy status: Secondary | ICD-10-CM

## 2024-07-28 DIAGNOSIS — Z820 Family history of epilepsy and other diseases of the nervous system: Secondary | ICD-10-CM

## 2024-07-28 DIAGNOSIS — G4733 Obstructive sleep apnea (adult) (pediatric): Secondary | ICD-10-CM | POA: Diagnosis not present

## 2024-07-28 DIAGNOSIS — Z79899 Other long term (current) drug therapy: Secondary | ICD-10-CM | POA: Diagnosis not present

## 2024-07-28 DIAGNOSIS — Z9889 Other specified postprocedural states: Principal | ICD-10-CM

## 2024-07-28 DIAGNOSIS — Z881 Allergy status to other antibiotic agents status: Secondary | ICD-10-CM | POA: Diagnosis not present

## 2024-07-28 DIAGNOSIS — M353 Polymyalgia rheumatica: Secondary | ICD-10-CM | POA: Diagnosis not present

## 2024-07-28 DIAGNOSIS — Z428 Encounter for other plastic and reconstructive surgery following medical procedure or healed injury: Secondary | ICD-10-CM | POA: Diagnosis not present

## 2024-07-28 DIAGNOSIS — Z888 Allergy status to other drugs, medicaments and biological substances status: Secondary | ICD-10-CM

## 2024-07-28 DIAGNOSIS — Z801 Family history of malignant neoplasm of trachea, bronchus and lung: Secondary | ICD-10-CM | POA: Diagnosis not present

## 2024-07-28 DIAGNOSIS — G9389 Other specified disorders of brain: Secondary | ICD-10-CM | POA: Diagnosis not present

## 2024-07-28 DIAGNOSIS — Z882 Allergy status to sulfonamides status: Secondary | ICD-10-CM | POA: Diagnosis not present

## 2024-07-28 DIAGNOSIS — Z8249 Family history of ischemic heart disease and other diseases of the circulatory system: Secondary | ICD-10-CM

## 2024-07-28 HISTORY — PX: CRANIOPLASTY: SHX1407

## 2024-07-28 LAB — PROTIME-INR
INR: 0.9 (ref 0.8–1.2)
Prothrombin Time: 12.6 s (ref 11.4–15.2)

## 2024-07-28 SURGERY — CRANIOPLASTY
Anesthesia: General | Laterality: Right

## 2024-07-28 MED ORDER — SURGIRINSE WOUND IRRIGATION SYSTEM - OPTIME
TOPICAL | Status: DC | PRN
  Administered 2024-07-28: 450 mL via TOPICAL

## 2024-07-28 MED ORDER — PROPOFOL 10 MG/ML IV BOLUS
INTRAVENOUS | Status: DC | PRN
  Administered 2024-07-28: 120 mg via INTRAVENOUS
  Administered 2024-07-28: 40 mg via INTRAVENOUS

## 2024-07-28 MED ORDER — MAGNESIUM OXIDE -MG SUPPLEMENT 400 (240 MG) MG PO TABS
400.0000 mg | ORAL_TABLET | Freq: Every day | ORAL | Status: DC
  Administered 2024-07-28: 400 mg via ORAL
  Filled 2024-07-28: qty 1

## 2024-07-28 MED ORDER — ONDANSETRON HCL 4 MG/2ML IJ SOLN
INTRAMUSCULAR | Status: AC
Start: 2024-07-28 — End: 2024-07-28
  Filled 2024-07-28: qty 2

## 2024-07-28 MED ORDER — FENTANYL CITRATE (PF) 100 MCG/2ML IJ SOLN
INTRAMUSCULAR | Status: AC
  Filled 2024-07-28: qty 2

## 2024-07-28 MED ORDER — PREDNISONE 5 MG PO TABS
5.0000 mg | ORAL_TABLET | Freq: Every day | ORAL | Status: DC
  Administered 2024-07-29: 5 mg via ORAL
  Filled 2024-07-28: qty 1

## 2024-07-28 MED ORDER — LABETALOL HCL 5 MG/ML IV SOLN
INTRAVENOUS | Status: AC
Start: 2024-07-28 — End: 2024-07-28
  Filled 2024-07-28: qty 4

## 2024-07-28 MED ORDER — PHENYLEPHRINE HCL-NACL 20-0.9 MG/250ML-% IV SOLN
INTRAVENOUS | Status: DC | PRN
  Administered 2024-07-28: 30 ug/min via INTRAVENOUS

## 2024-07-28 MED ORDER — ACETAMINOPHEN 650 MG RE SUPP
650.0000 mg | RECTAL | Status: DC | PRN
Start: 2024-07-28 — End: 2024-07-29

## 2024-07-28 MED ORDER — THROMBIN 5000 UNITS EX KIT
PACK | CUTANEOUS | Status: AC
  Filled 2024-07-28: qty 1

## 2024-07-28 MED ORDER — LIDOCAINE-EPINEPHRINE 1 %-1:100000 IJ SOLN
INTRAMUSCULAR | Status: DC | PRN
Start: 2024-07-28 — End: 2024-07-28
  Administered 2024-07-28: 5 mL

## 2024-07-28 MED ORDER — ROCURONIUM BROMIDE 10 MG/ML (PF) SYRINGE
PREFILLED_SYRINGE | INTRAVENOUS | Status: AC
  Filled 2024-07-28: qty 10

## 2024-07-28 MED ORDER — CHLORTHALIDONE 25 MG PO TABS
25.0000 mg | ORAL_TABLET | Freq: Every day | ORAL | Status: DC
  Administered 2024-07-28 – 2024-07-29 (×2): 25 mg via ORAL
  Filled 2024-07-28 (×2): qty 1

## 2024-07-28 MED ORDER — LIDOCAINE-EPINEPHRINE 1 %-1:100000 IJ SOLN
INTRAMUSCULAR | Status: AC
  Filled 2024-07-28: qty 1

## 2024-07-28 MED ORDER — CHLORHEXIDINE GLUCONATE CLOTH 2 % EX PADS: 6.0000 | MEDICATED_PAD | Freq: Once | CUTANEOUS | Status: DC

## 2024-07-28 MED ORDER — ONDANSETRON HCL 4 MG/2ML IJ SOLN
INTRAMUSCULAR | Status: DC | PRN
Start: 2024-07-28 — End: 2024-07-28
  Administered 2024-07-28: 4 mg via INTRAVENOUS

## 2024-07-28 MED ORDER — PROSIGHT PO TABS
1.0000 | ORAL_TABLET | Freq: Every day | ORAL | Status: DC
  Administered 2024-07-28 – 2024-07-29 (×2): 1 via ORAL
  Filled 2024-07-28 (×2): qty 1

## 2024-07-28 MED ORDER — ONDANSETRON HCL 4 MG/2ML IJ SOLN: 4.0000 mg | INTRAMUSCULAR | Status: DC | PRN

## 2024-07-28 MED ORDER — PROMETHAZINE HCL 12.5 MG PO TABS: 12.5000 mg | ORAL_TABLET | ORAL | Status: DC | PRN

## 2024-07-28 MED ORDER — OXYCODONE HCL 5 MG/5ML PO SOLN
ORAL | Status: AC
  Filled 2024-07-28: qty 5

## 2024-07-28 MED ORDER — LACTATED RINGERS IV SOLN: INTRAVENOUS | Status: DC | PRN

## 2024-07-28 MED ORDER — THROMBIN 20000 UNITS EX SOLR
CUTANEOUS | Status: DC | PRN
  Administered 2024-07-28: 20 mL via TOPICAL

## 2024-07-28 MED ORDER — DOXYLAMINE SUCCINATE (SLEEP) 25 MG PO TABS
25.0000 mg | ORAL_TABLET | Freq: Every day | ORAL | Status: DC
  Administered 2024-07-28: 25 mg via ORAL
  Filled 2024-07-28: qty 1

## 2024-07-28 MED ORDER — COLESEVELAM HCL 625 MG PO TABS
625.0000 mg | ORAL_TABLET | Freq: Every day | ORAL | Status: DC
  Administered 2024-07-29: 625 mg via ORAL
  Filled 2024-07-28: qty 1

## 2024-07-28 MED ORDER — ONDANSETRON HCL 4 MG/2ML IJ SOLN
4.0000 mg | Freq: Once | INTRAMUSCULAR | Status: DC | PRN
Start: 2024-07-28 — End: 2024-07-28

## 2024-07-28 MED ORDER — THROMBIN 5000 UNITS EX SOLR
CUTANEOUS | Status: DC | PRN
  Administered 2024-07-28: 5 mL via TOPICAL

## 2024-07-28 MED ORDER — ORAL CARE MOUTH RINSE
15.0000 mL | Freq: Once | OROMUCOSAL | Status: AC
Start: 2024-07-28 — End: 2024-07-28

## 2024-07-28 MED ORDER — IRBESARTAN 300 MG PO TABS
300.0000 mg | ORAL_TABLET | Freq: Every day | ORAL | Status: DC
  Administered 2024-07-29: 300 mg via ORAL
  Filled 2024-07-28: qty 1

## 2024-07-28 MED ORDER — FENTANYL CITRATE (PF) 250 MCG/5ML IJ SOLN
INTRAMUSCULAR | Status: AC
  Filled 2024-07-28: qty 5

## 2024-07-28 MED ORDER — LIDOCAINE 2% (20 MG/ML) 5 ML SYRINGE
INTRAMUSCULAR | Status: AC
  Filled 2024-07-28: qty 5

## 2024-07-28 MED ORDER — ACETAMINOPHEN 325 MG PO TABS: 650.0000 mg | ORAL_TABLET | ORAL | Status: DC | PRN

## 2024-07-28 MED ORDER — PROPOFOL 10 MG/ML IV BOLUS
INTRAVENOUS | Status: AC
  Filled 2024-07-28: qty 20

## 2024-07-28 MED ORDER — CEFAZOLIN SODIUM-DEXTROSE 2-4 GM/100ML-% IV SOLN
2.0000 g | INTRAVENOUS | Status: AC
  Administered 2024-07-28: 2 g via INTRAVENOUS

## 2024-07-28 MED ORDER — SODIUM CHLORIDE 0.9 % IV SOLN
0.1500 ug/kg/min | INTRAVENOUS | Status: AC
  Administered 2024-07-28: .15 ug/kg/min via INTRAVENOUS
  Filled 2024-07-28: qty 2000

## 2024-07-28 MED ORDER — OXYCODONE HCL 5 MG PO TABS: 5.0000 mg | ORAL_TABLET | Freq: Once | ORAL | Status: AC | PRN

## 2024-07-28 MED ORDER — LIDOCAINE 2% (20 MG/ML) 5 ML SYRINGE
INTRAMUSCULAR | Status: DC | PRN
  Administered 2024-07-28: 80 mg via INTRAVENOUS

## 2024-07-28 MED ORDER — PHENYLEPHRINE HCL-NACL 20-0.9 MG/250ML-% IV SOLN
INTRAVENOUS | Status: AC
  Filled 2024-07-28: qty 500

## 2024-07-28 MED ORDER — 0.9 % SODIUM CHLORIDE (POUR BTL) OPTIME
TOPICAL | Status: DC | PRN
  Administered 2024-07-28: 2000 mL

## 2024-07-28 MED ORDER — CEFAZOLIN SODIUM-DEXTROSE 2-4 GM/100ML-% IV SOLN
INTRAVENOUS | Status: AC
  Filled 2024-07-28: qty 100

## 2024-07-28 MED ORDER — ALBUMIN HUMAN 5 % IV SOLN: INTRAVENOUS | Status: DC | PRN

## 2024-07-28 MED ORDER — POTASSIUM CHLORIDE IN NACL 20-0.9 MEQ/L-% IV SOLN
INTRAVENOUS | Status: DC
  Filled 2024-07-28: qty 1000

## 2024-07-28 MED ORDER — FENTANYL CITRATE (PF) 100 MCG/2ML IJ SOLN
25.0000 ug | INTRAMUSCULAR | Status: DC | PRN
  Administered 2024-07-28 (×2): 25 ug via INTRAVENOUS

## 2024-07-28 MED ORDER — HYDROMORPHONE HCL 1 MG/ML IJ SOLN
INTRAMUSCULAR | Status: AC
  Filled 2024-07-28: qty 0.5

## 2024-07-28 MED ORDER — HYDROCODONE-ACETAMINOPHEN 5-325 MG PO TABS
1.0000 | ORAL_TABLET | ORAL | Status: DC | PRN
Start: 2024-07-28 — End: 2024-07-29
  Administered 2024-07-28 – 2024-07-29 (×3): 1 via ORAL
  Filled 2024-07-28 (×3): qty 1

## 2024-07-28 MED ORDER — TRYPTOPHAN 500 MG PO CAPS: ORAL_CAPSULE | Freq: Every day | ORAL | Status: DC

## 2024-07-28 MED ORDER — PANTOPRAZOLE SODIUM 40 MG PO TBEC
40.0000 mg | DELAYED_RELEASE_TABLET | Freq: Every day | ORAL | Status: DC
  Administered 2024-07-28 – 2024-07-29 (×2): 40 mg via ORAL
  Filled 2024-07-28 (×2): qty 1

## 2024-07-28 MED ORDER — CHLORHEXIDINE GLUCONATE 0.12 % MT SOLN
OROMUCOSAL | Status: AC
  Administered 2024-07-28: 15 mL via OROMUCOSAL
  Filled 2024-07-28: qty 15

## 2024-07-28 MED ORDER — BACITRACIN ZINC 500 UNIT/GM EX OINT
TOPICAL_OINTMENT | CUTANEOUS | Status: DC | PRN
  Administered 2024-07-28: 1 via TOPICAL

## 2024-07-28 MED ORDER — ONDANSETRON HCL 4 MG PO TABS: 4.0000 mg | ORAL_TABLET | ORAL | Status: DC | PRN

## 2024-07-28 MED ORDER — SOTALOL HCL 80 MG PO TABS
80.0000 mg | ORAL_TABLET | Freq: Two times a day (BID) | ORAL | Status: DC
  Administered 2024-07-28 – 2024-07-29 (×2): 80 mg via ORAL
  Filled 2024-07-28 (×2): qty 1

## 2024-07-28 MED ORDER — EZETIMIBE 10 MG PO TABS
10.0000 mg | ORAL_TABLET | Freq: Every day | ORAL | Status: DC
  Administered 2024-07-28 – 2024-07-29 (×2): 10 mg via ORAL
  Filled 2024-07-28 (×2): qty 1

## 2024-07-28 MED ORDER — AMOXICILLIN-POT CLAVULANATE 875-125 MG PO TABS
1.0000 | ORAL_TABLET | Freq: Two times a day (BID) | ORAL | Status: DC
  Administered 2024-07-28 – 2024-07-29 (×2): 1 via ORAL
  Filled 2024-07-28 (×2): qty 1

## 2024-07-28 MED ORDER — FENTANYL CITRATE (PF) 250 MCG/5ML IJ SOLN
INTRAMUSCULAR | Status: DC | PRN
  Administered 2024-07-28: 100 ug via INTRAVENOUS
  Administered 2024-07-28: 50 ug via INTRAVENOUS

## 2024-07-28 MED ORDER — TRAZODONE HCL 50 MG PO TABS
50.0000 mg | ORAL_TABLET | Freq: Every day | ORAL | Status: DC
  Administered 2024-07-28: 50 mg via ORAL
  Filled 2024-07-28: qty 1

## 2024-07-28 MED ORDER — THROMBIN 20000 UNITS EX SOLR
CUTANEOUS | Status: AC
  Filled 2024-07-28: qty 20000

## 2024-07-28 MED ORDER — CHLORHEXIDINE GLUCONATE 0.12 % MT SOLN: 15.0000 mL | Freq: Once | OROMUCOSAL | Status: AC

## 2024-07-28 MED ORDER — LACTATED RINGERS IV SOLN: INTRAVENOUS | Status: DC

## 2024-07-28 MED ORDER — MONTELUKAST SODIUM 10 MG PO TABS
10.0000 mg | ORAL_TABLET | Freq: Every day | ORAL | Status: DC
  Administered 2024-07-28: 10 mg via ORAL
  Filled 2024-07-28: qty 1

## 2024-07-28 MED ORDER — ROCURONIUM BROMIDE 10 MG/ML (PF) SYRINGE
PREFILLED_SYRINGE | INTRAVENOUS | Status: DC | PRN
  Administered 2024-07-28: 60 mg via INTRAVENOUS

## 2024-07-28 MED ORDER — OXYCODONE HCL 5 MG/5ML PO SOLN
5.0000 mg | Freq: Once | ORAL | Status: AC | PRN
  Administered 2024-07-28: 5 mg via ORAL

## 2024-07-28 MED ORDER — BACITRACIN ZINC 500 UNIT/GM EX OINT
TOPICAL_OINTMENT | CUTANEOUS | Status: AC
  Filled 2024-07-28: qty 28.35

## 2024-07-28 MED ORDER — HYDROMORPHONE HCL 1 MG/ML IJ SOLN
INTRAMUSCULAR | Status: DC | PRN
  Administered 2024-07-28: .5 mg via INTRAVENOUS

## 2024-07-28 MED ORDER — LABETALOL HCL 5 MG/ML IV SOLN
INTRAVENOUS | Status: DC | PRN
  Administered 2024-07-28 (×4): 5 mg via INTRAVENOUS

## 2024-07-28 MED ORDER — MEPERIDINE HCL 25 MG/ML IJ SOLN: 6.2500 mg | INTRAMUSCULAR | Status: DC | PRN

## 2024-07-28 MED ORDER — MORPHINE SULFATE (PF) 2 MG/ML IV SOLN: 1.0000 mg | INTRAVENOUS | Status: DC | PRN

## 2024-07-28 MED ORDER — ADULT MULTIVITAMIN W/MINERALS CH
1.0000 | ORAL_TABLET | Freq: Every day | ORAL | Status: DC
  Administered 2024-07-28 – 2024-07-29 (×2): 1 via ORAL
  Filled 2024-07-28 (×2): qty 1

## 2024-07-28 MED ORDER — SUGAMMADEX SODIUM 200 MG/2ML IV SOLN
INTRAVENOUS | Status: DC | PRN
Start: 2024-07-28 — End: 2024-07-28
  Administered 2024-07-28 (×2): 200 mg via INTRAVENOUS

## 2024-07-28 MED ORDER — VENLAFAXINE HCL ER 150 MG PO CP24
150.0000 mg | ORAL_CAPSULE | Freq: Every day | ORAL | Status: DC
  Administered 2024-07-29: 150 mg via ORAL
  Filled 2024-07-28: qty 1
  Filled 2024-07-28: qty 2

## 2024-07-28 MED ORDER — SENNA 8.6 MG PO TABS
1.0000 | ORAL_TABLET | Freq: Two times a day (BID) | ORAL | Status: DC
Start: 2024-07-28 — End: 2024-07-29
  Administered 2024-07-28 – 2024-07-29 (×2): 8.6 mg via ORAL
  Filled 2024-07-28 (×2): qty 1

## 2024-07-28 SURGICAL SUPPLY — 49 items
BAG COUNTER SPONGE SURGICOUNT (BAG) ×1 IMPLANT
BUR SPIRAL ROUTER 2.3 (BUR) ×1 IMPLANT
CANISTER SUCTION 3000ML PPV (SUCTIONS) ×1 IMPLANT
CLIP TI MEDIUM 6 (CLIP) IMPLANT
DRAPE MICROSCOPE SLANT 54X150 (MISCELLANEOUS) IMPLANT
DRAPE NEUROLOGICAL W/INCISE (DRAPES) ×1 IMPLANT
DRAPE SURG 17X23 STRL (DRAPES) IMPLANT
DRAPE WARM FLUID 44X44 (DRAPES) ×1 IMPLANT
DURAPREP 6ML APPLICATOR 50/CS (WOUND CARE) ×1 IMPLANT
ELECTRODE REM PT RTRN 9FT ADLT (ELECTROSURGICAL) ×1 IMPLANT
EVACUATOR 1/8 PVC DRAIN (DRAIN) IMPLANT
GAUZE 4X4 16PLY ~~LOC~~+RFID DBL (SPONGE) IMPLANT
GAUZE SPONGE 4X4 12PLY STRL (GAUZE/BANDAGES/DRESSINGS) ×1 IMPLANT
GLOVE BIO SURGEON STRL SZ7 (GLOVE) ×1 IMPLANT
GLOVE BIO SURGEON STRL SZ8 (GLOVE) ×1 IMPLANT
GLOVE BIOGEL PI IND STRL 7.0 (GLOVE) ×1 IMPLANT
GOWN STRL REUS W/ TWL LRG LVL3 (GOWN DISPOSABLE) ×1 IMPLANT
GOWN STRL REUS W/ TWL XL LVL3 (GOWN DISPOSABLE) ×1 IMPLANT
GOWN STRL REUS W/TWL 2XL LVL3 (GOWN DISPOSABLE) IMPLANT
HEMOSTAT POWDER KIT SURGIFOAM (HEMOSTASIS) ×1 IMPLANT
IMPL PEEK CUSTOM CRANIAL (Neurostimulator) IMPLANT
KIT BASIN OR (CUSTOM PROCEDURE TRAY) ×1 IMPLANT
KIT TURNOVER KIT B (KITS) ×1 IMPLANT
NDL HYPO 22X1.5 SAFETY MO (MISCELLANEOUS) ×1 IMPLANT
NEEDLE HYPO 22X1.5 SAFETY MO (MISCELLANEOUS) ×1 IMPLANT
PACK CRANIOTOMY CUSTOM (CUSTOM PROCEDURE TRAY) ×1 IMPLANT
PAD ARMBOARD POSITIONER FOAM (MISCELLANEOUS) ×1 IMPLANT
PATTIES SURGICAL .5 X3 (DISPOSABLE) IMPLANT
PERFORATOR LRG 14-11MM (BIT) ×1 IMPLANT
PIN MAYFIELD SKULL DISP (PIN) IMPLANT
PLATE BONE 12 2H TARGET XL (Plate) IMPLANT
SCREW UNIII AXS SD 1.5X4 (Screw) IMPLANT
SOLN 0.9% NACL POUR BTL 1000ML (IV SOLUTION) ×1 IMPLANT
SOLN STERILE WATER BTL 1000 ML (IV SOLUTION) ×1 IMPLANT
SOLUTION IRRIG SURGIPHOR (IV SOLUTION) IMPLANT
SPONGE NEURO XRAY DETECT 1X3 (DISPOSABLE) IMPLANT
SPONGE SURGIFOAM ABS GEL 100 (HEMOSTASIS) ×1 IMPLANT
STAPLER SKIN PROX 35W (STAPLE) ×1 IMPLANT
SUT 3-0 BLK 1X30 PSL (SUTURE) IMPLANT
SUT ETHILON 3 0 PS 1 (SUTURE) IMPLANT
SUT NURALON 4 0 TR CR/8 (SUTURE) ×2 IMPLANT
SUT VIC AB 2-0 CP2 18 (SUTURE) ×1 IMPLANT
SYR CONTROL 10ML LL (SYRINGE) ×1 IMPLANT
TAPE CLOTH SURG 4X10 WHT LF (GAUZE/BANDAGES/DRESSINGS) IMPLANT
TOWEL GREEN STERILE (TOWEL DISPOSABLE) ×1 IMPLANT
TOWEL GREEN STERILE FF (TOWEL DISPOSABLE) ×1 IMPLANT
TRAY FOLEY MTR SLVR 16FR STAT (SET/KITS/TRAYS/PACK) ×1 IMPLANT
TUBING FEATHERFLOW (TUBING) IMPLANT
UNDERPAD 30X36 HEAVY ABSORB (UNDERPADS AND DIAPERS) IMPLANT

## 2024-07-28 NOTE — Transfer of Care (Signed)
 Immediate Anesthesia Transfer of Care Note  Patient: [name] [name]  Procedure(s) Performed: CRANIOPLASTY (Right)  Patient Location: PACU  Anesthesia Type:General  Level of Consciousness: awake, alert , and oriented  Airway & Oxygen Therapy: Patient Spontanous Breathing and Patient connected to face mask oxygen  Post-op Assessment: Report given to RN and Post -op Vital signs reviewed and stable  Post vital signs: Reviewed and stable  Last Vitals:  Vitals Value Taken Time  BP 145/59 07/28/24 15:15  Temp    Pulse 85 07/28/24 15:16  Resp 23 07/28/24 15:16  SpO2 94 % 07/28/24 15:16  Vitals shown include unfiled device data.  Last Pain:  Vitals:   07/28/24 1128  TempSrc:   PainSc: 0-No pain         Complications: No notable events documented.

## 2024-07-28 NOTE — Progress Notes (Signed)
 Pt admitted to the unit from PACU. PT is Ax0x4 . Pt VSS. Pt is oriented to the unit. The skin assessment was completed with Romualdo Nightingale RN and skin is intact other than surgical incision on forehead. Pt has clear respiratory sounds Bilaterally. Pt has an inspire that helps with her sleep Apnea. Pt belongings are the bedside with the patient. Call-bell with in reach and bed in lowest position. CCMD called and tele applied.

## 2024-07-28 NOTE — Op Note (Signed)
 07/28/2024  3:09 PM  PATIENT:  [name] [name]  78 y.o. female  PRE-OPERATIVE DIAGNOSIS: Cranial defect right frontal, greater than 5 cm  POST-OPERATIVE DIAGNOSIS:  same  PROCEDURE: Right frontal cranioplasty, greater than 5 cm  SURGEON:  Alm Molt, MD  ASSISTANTS: Meyran FNP  ANESTHESIA:   General  EBL: 150 ml  Total I/O In: 500 [I.V.:250; IV Piggyback:250] Out: -   BLOOD ADMINISTERED: none  DRAINS: Medium Hemovac  SPECIMEN:  none  INDICATION FOR PROCEDURE: This patient presented with a right frontal defect after removal of infected bone flap several weeks ago.SABRA  Recommended right frontal cranioplasty. Patient understood the risks, benefits, and alternatives and potential outcomes and wished to proceed.  PROCEDURE DETAILS: The patient was taken to the operating room and after induction of adequate generalized endotracheal anesthesia, the head was placed on a doughnut and turned to the left to expose the right frontotemporal parietal region. The head was shaved and then cleaned and then prepped with DuraPrep and draped in the usual sterile fashion. 10 cc of local anesthetic was injected, and a curvilinear incision was made from the right temporal region to the left frontal region. Raney clips were placed to establish hemostasis of the scalp, the muscle was reflected with the scalp flap, to expose the the underlying cranial defect.  We used blunt dissection with her fingers to dissect the skin flap away from the underlying dura.  It peeled back very easily.  At the inferior frontal edge we found our vascular lysed periosteal flap still intact.  We left this intact.  We had a premade cranioplasty made out of peak.  It fit perfectly into the cranial defect.  We irrigated with saline solution.  We placed our cranial defect and placed 4 doggy bone plates and locked it into position.  It had an excellent fit.  Irrigated with 0.5% povidone iodine solution followed by saline solution.  We  dried all bleeding.  We placed a subgaleal medium Hemovac drain.  the galea was then closed with interrupted 2-0 Vicryl suture.  We were not able to fully place subgaleal stitches in all areas because they would not hold.  Therefore we decided to use a running locking 3-0 Ethilon suture to close the skin.  A sterile dressing was applied.  She was awakened from general anesthesia, and transported to the recovery room in stable condition. At the end of the procedure all sponge, needle, and instrument counts were correct.    PLAN OF CARE: Admit to inpatient   PATIENT DISPOSITION:  PACU - hemodynamically stable.   Delay start of Pharmacological VTE agent (>24hrs) due to surgical blood loss or risk of bleeding:  yes

## 2024-07-28 NOTE — Anesthesia Postprocedure Evaluation (Signed)
 Anesthesia Post Note  Patient: [name] [name]  Procedure(s) Performed: CRANIOPLASTY (Right)     Patient location during evaluation: PACU Anesthesia Type: General Level of consciousness: awake and alert Pain management: pain level controlled Vital Signs Assessment: post-procedure vital signs reviewed and stable Respiratory status: spontaneous breathing, nonlabored ventilation, respiratory function stable and patient connected to nasal cannula oxygen Cardiovascular status: blood pressure returned to baseline and stable Postop Assessment: no apparent nausea or vomiting Anesthetic complications: no   No notable events documented.  Last Vitals:  Vitals:   07/28/24 1120 07/28/24 1126  BP: (!) 195/57 (!) 174/60  Pulse:    Resp:    Temp:    SpO2:      Last Pain:  Vitals:   07/28/24 1128  TempSrc:   PainSc: 0-No pain                 Jquan Egelston

## 2024-07-28 NOTE — H&P (Signed)
 Subjective: Patient is a 78 y.o. female admitted for cranioplasty in the right frontal region.  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  She had the bone flap removed several weeks ago.  She presents today for cranioplasty.    Past Medical History:  Diagnosis Date   Anemia    years ago after surgery   Atrial fibrillation (HCC)    Bronchitis    Dysrhythmia    A-fib   Elevated coronary artery calcium score 02/23/2024   FUO (fever of unknown origin) 03/03/2015   GERD (gastroesophageal reflux disease)    Heart murmur    Mild to moderate MR, moderate AR 03/16/24   Hepatitis 1980s   Hepatitis    non A- non B   HTN (hypertension)    Hypercholesterolemia    Night sweat 03/03/2015   PMR (polymyalgia rheumatica) 03/15/2015   Polyarthritis 03/03/2015   Polymyalgia 03/03/2015   Sinusitis 06/14/2024   Sleep apnea    Pt has Inspire Device    Past Surgical History:  Procedure Laterality Date   ABDOMINAL HYSTERECTOMY  1973   APPENDECTOMY  09/24/1971   APPLICATION OF CRANIAL NAVIGATION Right 02/05/2023   Procedure: APPLICATION OF CRANIAL NAVIGATION;  Surgeon: Joshua Alm RAMAN, MD;  Location: [facility] OR;  Service: Neurosurgery;  Laterality: Right;   CHOLECYSTECTOMY N/A 10/08/2022   Procedure: LAPAROSCOPIC CHOLECYSTECTOMY;  Surgeon: Belinda Cough, MD;  Location: [facility] OR;  Service: General;  Laterality: N/A;   CRANIOTOMY Right 02/05/2023   Procedure: Craniotomy - right for meningioma - Frontal;  Surgeon: Joshua Alm RAMAN, MD;  Location: [facility] OR;  Service: Neurosurgery;  Laterality: Right;   CRANIOTOMY Right 05/04/2024   Procedure: REMOVAL OF CRANIOTOMY BONE FLAP AND ABDOMINAL FAT GRAFT;  Surgeon: Joshua Alm Hamilton, MD;  Location: [facility] OR;  Service: Neurosurgery;  Laterality: Right;  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   DRUG INDUCED ENDOSCOPY N/A 11/14/2021   Procedure: DRUG INDUCED  SLEEP ENDOSCOPY;  Surgeon: Carlie Clark, MD;  Location: [facility];  Service: ENT;  Laterality: N/A;   FOOT SURGERY Left 09/24/2007   IMPLANTATION OF HYPOGLOSSAL NERVE STIMULATOR Right 12/18/2021   Procedure: IMPLANTATION OF HYPOGLOSSAL NERVE STIMULATOR;  Surgeon: Carlie Clark, MD;  Location: [facility];  Service: ENT;  Laterality: Right;   INTRAOPERATIVE CHOLANGIOGRAM N/A 10/08/2022   Procedure: INTRAOPERATIVE CHOLANGIOGRAM;  Surgeon: Belinda Cough, MD;  Location: [facility] OR;  Service: General;  Laterality: N/A;   KNEE ARTHROSCOPY  09/23/2005   right   OOPHORECTOMY Bilateral 2001   TUBAL LIGATION  09/23/1970    Prior to Admission medications   Medication Sig Start Date End Date Taking? Authorizing Provider  amoxicillin -clavulanate (AUGMENTIN ) 875-125 MG tablet Take one tablet every 8 hours 06/14/24  Yes Fleeta Rothman, Jomarie SAILOR, MD  b complex vitamins capsule Take 1 capsule by mouth daily.   Yes [provider]  chlorthalidone  (HYGROTON ) 25 MG tablet Take 1 tablet (25 mg total) by mouth daily. 03/06/16  Yes Jordan, Peter M, MD  colesevelam [facility]) 625 MG tablet Take 625 mg by mouth daily. 04/26/24  Yes [provider]  diphenhydrAMINE  (BENADRYL ) 25 MG tablet Take 25 mg by mouth at bedtime.   Yes [provider]  doxylamine, Sleep, (UNISOM) 25 MG tablet Take 25 mg by mouth at bedtime.   Yes [provider]  ezetimibe  (ZETIA ) 10 MG tablet TAKE 1 TABLET BY MOUTH EVERY DAY  07/24/22  Yes Jordan, Peter M, MD  fluticasone  (FLONASE ) 50 MCG/ACT nasal spray Place 2 sprays into both nostrils daily. 08/28/19  Yes [provider]  irbesartan  (AVAPRO ) 300 MG tablet Take 300 mg by mouth daily.   Yes [provider]  Magnesium  Oxide -Mg Supplement 200 MG TABS Take 400 mg by mouth at bedtime.   Yes [provider]  montelukast  (SINGULAIR ) 10 MG tablet Take 10 mg by mouth at bedtime. 10/17/19  Yes [provider]   Multiple Vitamin (MULTI-VITAMINS) TABS Take 1 tablet by mouth daily.    Yes [provider]  Multiple Vitamins-Minerals (OCUVITE EYE HEALTH FORMULA) CAPS Take 1 tablet by mouth daily. 12/30/11  Yes [provider]  omeprazole (PRILOSEC) 20 MG capsule Take 20 mg by mouth daily.   Yes [provider]  OVER THE COUNTER MEDICATION Take 2 tablets by mouth at bedtime. Restful monk sleep supplement (without melatonin)   Yes [provider]  predniSONE  (DELTASONE ) 5 MG tablet Take 5 mg by mouth daily with breakfast.   Yes [provider]  REPATHA SURECLICK 140 MG/ML SOAJ Inject 140 mg into the skin every 14 (fourteen) days. 04/10/24  Yes [provider]  sotalol  (BETAPACE ) 80 MG tablet Take 80 mg by mouth 2 (two) times daily.   Yes [provider]  traZODone (DESYREL) 50 MG tablet Take 50 mg by mouth at bedtime. 01/10/23  Yes [provider]  TRYPTOPHAN  PO Take 1 capsule by mouth at bedtime.   Yes [provider]  venlafaxine  XR (EFFEXOR -XR) 150 MG 24 hr capsule Take 150 mg by mouth daily.   Yes [provider]  Vitamin D, Ergocalciferol, (DRISDOL) 1.25 MG (50000 UNIT) CAPS capsule Take 50,000 Units by mouth every Monday. 10/21/22  Yes [provider]  albuterol  (PROAIR  HFA) 108 (90 Base) MCG/ACT inhaler Inhale 2 puffs into the lungs every 6 (six) hours as needed. Patient not taking: Reported on 07/14/2024 09/14/18   Jude Harden GAILS, MD  HYDROcodone -acetaminophen  (NORCO/VICODIN) 5-325 MG tablet Take 1 tablet by mouth every 4 (four) hours as needed for moderate pain (pain score 4-6). Patient not taking: Reported on 07/14/2024 05/05/24   Johnanna Credit Caylin, PA-C  warfarin (COUMADIN ) 5 MG tablet TAKE 1 TABLET BY MOUTH IN THE AFTERNOON OR AS DIRECTED BY COUMADIN  CLINIC Patient taking differently: Take 2.5-5 mg by mouth See admin instructions. Take 2.5 mg by mouth on Monday and take 5 mg Tuesday-Sunday 06/02/24    Cindie Ole DASEN, MD   Allergies  Allergen Reactions   Atorvastatin Other (See Comments)    Muscle cramps severe      Simvastatin Other (See Comments)    Severe muscle cramps   Cephalosporins     'ran a fever   Nickel Rash   Sulfa Antibiotics Hives   Sulfasalazine Hives   Dilaudid  [Hydromorphone ] Rash   Eliquis [Apixaban] Hives and Itching   Xarelto  [Rivaroxaban ] Hives and Itching    Social History   Tobacco Use   Smoking status: Former    Current packs/day: 0.00    Average packs/day: 0.2 packs/day for 5.0 years (1.0 ttl pk-yrs)    Types: Cigarettes    Start date: 09/24/1975    Quit date: 09/23/1980    Years since quitting: 43.8   Smokeless tobacco: Never  Substance Use Topics   Alcohol  use: Yes    Alcohol /week: 14.0 standard drinks of alcohol     Types: 14 Glasses of wine per week    Family History  Problem Relation Age of Onset   Hypertension Sister    Lung cancer Father    Hypertension Mother    Multiple sclerosis Daughter    Breast cancer Other        maternal aunt   Allergies Daughter    Allergies Daughter    Allergies Son      Review of Systems  Positive ROS: Negative  All other systems have been reviewed and were otherwise negative with the exception of those mentioned in the HPI and as above.  Objective: Vital signs in last 24 hours: Temp:  [98.4 F (36.9 C)] 98.4 F (36.9 C) (11/05 1114) Pulse Rate:  [69] 69 (11/05 1114) Resp:  [18] 18 (11/05 1114) BP: (174-214)/(57-63) 174/60 (11/05 1126) SpO2:  [99 %] 99 % (11/05 1114) Weight:  [89.4 kg] 89.4 kg (11/05 1114)  General Appearance: Alert, cooperative, no distress, appears stated age Head: Normocephalic, without obvious abnormality, atraumatic Eyes: PERRL, conjunctiva/corneas clear, EOM's intact    Neck: Supple, symmetrical, trachea midline Back: Symmetric, no curvature, ROM normal, no CVA tenderness Lungs:  respirations unlabored Heart: Regular rate and rhythm Abdomen: Soft,  non-tender Extremities: Extremities normal, atraumatic, no cyanosis or edema Pulses: 2+ and symmetric all extremities Skin: Skin color, texture, turgor normal, no rashes or lesions  NEUROLOGIC:   Mental status: Alert and oriented x4,  no aphasia, good attention span, fund of knowledge, and memory Motor Exam - grossly normal Sensory Exam - grossly normal Reflexes: 1+ Coordination - grossly normal Gait - grossly normal Balance - grossly normal Cranial Nerves: I: smell Not tested  II: visual acuity  OS: nl    OD: nl  II: visual fields Full to confrontation  II: pupils Equal, round, reactive to light  III,VII: ptosis None  III,IV,VI: extraocular muscles  Full ROM  V: mastication Normal  V: facial light touch sensation  Normal  V,VII: corneal reflex  Present  VII: facial muscle function - upper  Normal  VII: facial muscle function - lower Normal  VIII: hearing Not tested  IX: soft palate elevation  Normal  IX,X: gag reflex Present  XI: trapezius strength  5/5  XI: sternocleidomastoid strength 5/5  XI: neck flexion strength  5/5  XII: tongue strength  Normal    Data Review Lab Results  Component Value Date   WBC 12.3 (H) 07/19/2024   HGB 14.0 07/19/2024   HCT 40.8 07/19/2024   MCV 95.1 07/19/2024   PLT 178 07/19/2024   Lab Results  Component Value Date   NA 132 (L) 07/19/2024   K 3.8 07/19/2024   CL 93 (L) 07/19/2024   CO2 25 07/19/2024   BUN 24 (H) 07/19/2024   CREATININE 1.08 (H) 07/19/2024   GLUCOSE 123 (H) 07/19/2024   Lab Results  Component Value Date   INR 0.9 07/28/2024    Assessment/Plan:  Estimated body mass index is 33.81 kg/m as calculated from the following:   Height as of this encounter: 5' 4 (1.626 m).   Weight as of this encounter: 89.4 kg. Patient admitted for cranioplasty, right frontal  I explained the condition and procedure to the patient and answered any questions.  Patient wishes to proceed with procedure as planned. Understands  risks/ benefits and typical outcomes of procedure.   Alm GORMAN Molt 07/28/2024 1:05 PM

## 2024-07-28 NOTE — Anesthesia Procedure Notes (Signed)
 Procedure Name: Intubation Date/Time: 07/28/2024 1:53 PM  Performed by: Mannie Krystal LABOR, CRNAPre-anesthesia Checklist: Patient identified, Emergency Drugs available, Suction available and Patient being monitored Patient Re-evaluated:Patient Re-evaluated prior to induction Oxygen Delivery Method: Circle system utilized Preoxygenation: Pre-oxygenation with 100% oxygen Induction Type: IV induction Ventilation: Mask ventilation without difficulty Laryngoscope Size: Mac and 3 Grade View: Grade I Tube type: Oral Tube size: 7.0 mm Number of attempts: 1 Airway Equipment and Method: Stylet and Oral airway Placement Confirmation: ETT inserted through vocal cords under direct vision, positive ETCO2 and breath sounds checked- equal and bilateral Secured at: 22 cm Tube secured with: Tape Dental Injury: Teeth and Oropharynx as per pre-operative assessment

## 2024-07-28 NOTE — Progress Notes (Signed)
 Pt's BP 195/57 and 174/60. Dr. Mallory is aware.

## 2024-07-28 NOTE — Progress Notes (Signed)
 PHARMACIST - PHYSICIAN ORDER COMMUNICATION  CONCERNING: P&T Medication Policy on Herbal Medications  DESCRIPTION:  This patient's order for:  Tryptophan   has been noted.  This product(s) is classified as an "herbal" or natural product. Due to a lack of definitive safety studies or FDA approval, nonstandard manufacturing practices, plus the potential risk of unknown drug-drug interactions while on inpatient medications, the Pharmacy and Therapeutics Committee does not permit the use of "herbal" or natural products of this type within [facility].   ACTION TAKEN: The pharmacy department is unable to verify this order at this time and your patient has been informed of this safety policy. Please reevaluate patient's clinical condition at discharge and address if the herbal or natural product(s) should be resumed at that time.  Vito Ralph, PharmD, BCPS Please see amion for complete clinical pharmacist phone list 07/28/2024 7:56 PM

## 2024-07-29 ENCOUNTER — Encounter (HOSPITAL_COMMUNITY): Payer: Self-pay | Admitting: Neurological Surgery

## 2024-07-29 MED ORDER — HYDROCODONE-ACETAMINOPHEN 5-325 MG PO TABS: 1.0000 | ORAL_TABLET | ORAL | 0 refills | Status: AC | PRN

## 2024-07-29 NOTE — Progress Notes (Signed)
 Order to discharge patient home. Discharge instructions/AVS given to and reviewed with patient. Education provided as needed . Patient verbalized understanding. 1 PIV removed by the RN. Personal belongings sent home with the patient. Home via private vehicle.

## 2024-07-29 NOTE — Discharge Summary (Signed)
 Physician Discharge Summary  Patient ID: [name] [name] MRN: [id number] DOB/AGE: [birth year] 78 y.o.  Admit date: 07/28/2024 Discharge date: 07/29/2024  Admission Diagnoses: cranial defect    Discharge Diagnoses: same   Discharged Condition: good  Hospital Course: The patient was admitted on 07/28/2024 and taken to the operating room where the patient underwent cranioplasty. The patient tolerated the procedure well and was taken to the recovery room and then to the floor in stable condition. The hospital course was routine. There were no complications. The wound remained clean dry and intact. Pt had appropriate head soreness. No complaints of arm pain or new N/T/W. The patient remained afebrile with stable vital signs, and tolerated a regular diet. The patient continued to increase activities, and pain was well controlled with oral pain medications.   Consults: None  Significant Diagnostic Studies:  Results for orders placed or performed during the hospital encounter of 07/28/24  PT-INR Day of Surgery per protocol   Collection Time: 07/28/24 11:57 AM  Result Value Ref Range   Prothrombin Time 12.6 11.4 - 15.2 seconds   INR 0.9 0.8 - 1.2    No results found.  Antibiotics:  Anti-infectives (From admission, onward)    Start     Dose/Rate Route Frequency Ordered Stop   07/28/24 2200  amoxicillin -clavulanate (AUGMENTIN ) 875-125 MG per tablet 1 tablet       Note to Pharmacy: Take one tablet every 8 hours     1 tablet Oral Every 12 hours 07/28/24 1614     07/28/24 1115  ceFAZolin  (ANCEF ) IVPB 2g/100 mL premix        2 g 200 mL/hr over 30 Minutes Intravenous On call to O.R. 07/28/24 1107 07/28/24 1400   07/28/24 1109  ceFAZolin  (ANCEF ) 2-4 GM/100ML-% IVPB       Note to Pharmacy: Ezequiel Henri: cabinet override      07/28/24 1109 07/28/24 1403       Discharge Exam: Blood pressure (!) 151/66, pulse 78, temperature 98.5 F (36.9 C), temperature source Oral, resp. rate 16,  height 5' 4 (1.626 m), weight 89.4 kg, SpO2 96%. Neurologic: Grossly normal Wound CDI  Discharge Medications:   Allergies as of 07/29/2024       Reactions   Atorvastatin Other (See Comments)   Muscle cramps severe   Simvastatin Other (See Comments)   Severe muscle cramps   Cephalosporins    'ran a fever   Nickel Rash   Sulfa Antibiotics Hives   Sulfasalazine Hives   Dilaudid  [hydromorphone ] Rash   Eliquis [apixaban] Hives, Itching   Xarelto  [rivaroxaban ] Hives, Itching        Medication List     TAKE these medications    albuterol  108 (90 Base) MCG/ACT inhaler Commonly known as: ProAir  HFA Inhale 2 puffs into the lungs every 6 (six) hours as needed.   amoxicillin -clavulanate 875-125 MG tablet Commonly known as: AUGMENTIN  Take one tablet every 8 hours   b complex vitamins capsule Take 1 capsule by mouth daily.   chlorthalidone  25 MG tablet Commonly known as: HYGROTON  Take 1 tablet (25 mg total) by mouth daily.   colesevelam 625 MG tablet Commonly known as: WELCHOL Take 625 mg by mouth daily.   diphenhydrAMINE  25 MG tablet Commonly known as: BENADRYL  Take 25 mg by mouth at bedtime.   doxylamine (Sleep) 25 MG tablet Commonly known as: UNISOM Take 25 mg by mouth at bedtime.   ezetimibe  10 MG tablet Commonly known as: ZETIA  TAKE 1 TABLET BY MOUTH EVERY  DAY   fluticasone  50 MCG/ACT nasal spray Commonly known as: FLONASE  Place 2 sprays into both nostrils daily.   HYDROcodone -acetaminophen  5-325 MG tablet Commonly known as: NORCO/VICODIN Take 1 tablet by mouth every 4 (four) hours as needed for moderate pain (pain score 4-6).   irbesartan  300 MG tablet Commonly known as: AVAPRO  Take 300 mg by mouth daily.   Magnesium  Oxide -Mg Supplement 200 MG Tabs Take 400 mg by mouth at bedtime.   montelukast  10 MG tablet Commonly known as: SINGULAIR  Take 10 mg by mouth at bedtime.   Multi-Vitamins Tabs Take 1 tablet by mouth daily.   Ocuvite Eye Health  Formula Caps Take 1 tablet by mouth daily.   omeprazole 20 MG capsule Commonly known as: PRILOSEC Take 20 mg by mouth daily.   OVER THE COUNTER MEDICATION Take 2 tablets by mouth at bedtime. Restful monk sleep supplement (without melatonin)   predniSONE  5 MG tablet Commonly known as: DELTASONE  Take 5 mg by mouth daily with breakfast.   Repatha SureClick 140 MG/ML Soaj Generic drug: Evolocumab Inject 140 mg into the skin every 14 (fourteen) days.   sotalol  80 MG tablet Commonly known as: BETAPACE  Take 80 mg by mouth 2 (two) times daily.   traZODone 50 MG tablet Commonly known as: DESYREL Take 50 mg by mouth at bedtime.   TRYPTOPHAN  PO Take 1 capsule by mouth at bedtime.   venlafaxine  XR 150 MG 24 hr capsule Commonly known as: EFFEXOR -XR Take 150 mg by mouth daily.   Vitamin D (Ergocalciferol) 1.25 MG (50000 UNIT) Caps capsule Commonly known as: DRISDOL Take 50,000 Units by mouth every Monday.   warfarin 5 MG tablet Commonly known as: COUMADIN  Take as directed. If you are unsure how to take this medication, talk to your nurse or doctor. Original instructions: TAKE 1 TABLET BY MOUTH IN THE AFTERNOON OR AS DIRECTED BY COUMADIN  CLINIC What changed: See the new instructions.        Disposition: home   Final Dx: cranioplasty  Discharge Instructions     Call MD for:  difficulty breathing, headache or visual disturbances   Complete by: As directed    Call MD for:  persistant nausea and vomiting   Complete by: As directed    Call MD for:  redness, tenderness, or signs of infection (pain, swelling, redness, odor or green/yellow discharge around incision site)   Complete by: As directed    Call MD for:  severe uncontrolled pain   Complete by: As directed    Call MD for:  temperature >100.4   Complete by: As directed    Diet - low sodium heart healthy   Complete by: As directed    Increase activity slowly   Complete by: As directed    No wound care   Complete by:  As directed           Signed: Alm GORMAN Molt 07/29/2024, 8:04 AM

## 2024-08-02 ENCOUNTER — Ambulatory Visit (HOSPITAL_BASED_OUTPATIENT_CLINIC_OR_DEPARTMENT_OTHER): Admitting: Pulmonary Disease

## 2024-08-03 ENCOUNTER — Telehealth: Payer: Self-pay | Admitting: *Deleted

## 2024-08-03 NOTE — Telephone Encounter (Signed)
 Patient called and stated she has not restarted the warfarin back at this time. She states that she is planning to restart the warfarin today and will call the surgeon's office to be sure it is okay. Canceled tomorrow's appointment and rescheduled for 1 week on next Wednesday. She will call back if she has to delay restarting warfarin.

## 2024-08-04 ENCOUNTER — Ambulatory Visit

## 2024-08-06 NOTE — Telephone Encounter (Signed)
 Her ppt was moved to 09/09/24 with Alva

## 2024-08-09 DIAGNOSIS — E785 Hyperlipidemia, unspecified: Secondary | ICD-10-CM | POA: Diagnosis not present

## 2024-08-10 ENCOUNTER — Ambulatory Visit: Admitting: Infectious Diseases

## 2024-08-10 ENCOUNTER — Other Ambulatory Visit: Payer: Self-pay

## 2024-08-10 ENCOUNTER — Encounter: Payer: Self-pay | Admitting: Infectious Diseases

## 2024-08-10 VITALS — BP 149/78 | HR 64 | Temp 97.6°F | Ht 64.0 in | Wt 198.0 lb

## 2024-08-10 DIAGNOSIS — J0140 Acute pansinusitis, unspecified: Secondary | ICD-10-CM

## 2024-08-10 DIAGNOSIS — Z9889 Other specified postprocedural states: Secondary | ICD-10-CM

## 2024-08-10 NOTE — Progress Notes (Unsigned)
 Patient: [name] [name]  DOB: December 23, 1945 MRN: [id number] PCP: Janey Santos, MD  Referring Provider: ***  Reason for Visit: ***  Chief Complaint  Patient presents with   Follow-up      Subjective   Subjective:    Discussed the use of AI scribe software for clinical note transcription with the patient, who gave verbal consent to proceed.  History of Present Illness      ROS  Past Medical History:  Diagnosis Date   Anemia    years ago after surgery   Atrial fibrillation (HCC)    Bronchitis    Dysrhythmia    A-fib   Elevated coronary artery calcium score 02/23/2024   FUO (fever of unknown origin) 03/03/2015   GERD (gastroesophageal reflux disease)    Heart murmur    Mild to moderate MR, moderate AR 03/16/24   Hepatitis 1980s   Hepatitis    non A- non B   HTN (hypertension)    Hypercholesterolemia    Night sweat 03/03/2015   PMR (polymyalgia rheumatica) 03/15/2015   Polyarthritis 03/03/2015   Polymyalgia 03/03/2015   Sinusitis 06/14/2024   Sleep apnea    Pt has Inspire Device    Outpatient Medications Prior to Visit  Medication Sig Dispense Refill   b complex vitamins capsule Take 1 capsule by mouth daily.     chlorthalidone  (HYGROTON ) 25 MG tablet Take 1 tablet (25 mg total) by mouth daily. 90 tablet 1   colesevelam (WELCHOL) 625 MG tablet Take 625 mg by mouth daily.     diphenhydrAMINE  (BENADRYL ) 25 MG tablet Take 25 mg by mouth at bedtime.     doxylamine, Sleep, (UNISOM) 25 MG tablet Take 25 mg by mouth at bedtime.     ezetimibe  (ZETIA ) 10 MG tablet TAKE 1 TABLET BY MOUTH EVERY DAY 90 tablet 3   fluticasone  (FLONASE ) 50 MCG/ACT nasal spray Place 2 sprays into both nostrils daily.     irbesartan  (AVAPRO ) 300 MG tablet Take 300 mg by mouth daily.     Magnesium  Oxide -Mg Supplement 200 MG TABS Take 400 mg by mouth at bedtime.     montelukast  (SINGULAIR ) 10 MG tablet Take 10 mg by mouth at bedtime.     Multiple Vitamin (MULTI-VITAMINS) TABS Take  1 tablet by mouth daily.      Multiple Vitamins-Minerals (OCUVITE EYE HEALTH FORMULA) CAPS Take 1 tablet by mouth daily.     omeprazole (PRILOSEC) 20 MG capsule Take 20 mg by mouth daily.     predniSONE  (DELTASONE ) 5 MG tablet Take 5 mg by mouth daily with breakfast.     REPATHA SURECLICK 140 MG/ML SOAJ Inject 140 mg into the skin every 14 (fourteen) days.     sotalol  (BETAPACE ) 80 MG tablet Take 80 mg by mouth 2 (two) times daily.     traZODone (DESYREL) 50 MG tablet Take 50 mg by mouth at bedtime.     TRYPTOPHAN  PO Take 1 capsule by mouth at bedtime.     venlafaxine  XR (EFFEXOR -XR) 150 MG 24 hr capsule Take 150 mg by mouth daily.     Vitamin D, Ergocalciferol, (DRISDOL) 1.25 MG (50000 UNIT) CAPS capsule Take 50,000 Units by mouth every Monday.     warfarin (COUMADIN ) 5 MG tablet TAKE 1 TABLET BY MOUTH IN THE AFTERNOON OR AS DIRECTED BY COUMADIN  CLINIC (Patient taking differently: Take 2.5-5 mg by mouth See admin instructions. Take 2.5 mg by mouth on Monday and take 5 mg Tuesday-Sunday) 100 tablet 1  albuterol  (PROAIR  HFA) 108 (90 Base) MCG/ACT inhaler Inhale 2 puffs into the lungs every 6 (six) hours as needed. (Patient not taking: Reported on 08/10/2024) 1 Inhaler 5   amoxicillin -clavulanate (AUGMENTIN ) 875-125 MG tablet Take one tablet every 8 hours (Patient not taking: Reported on 08/10/2024) 126 tablet 0   HYDROcodone -acetaminophen  (NORCO/VICODIN) 5-325 MG tablet Take 1 tablet by mouth every 4 (four) hours as needed for moderate pain (pain score 4-6). (Patient not taking: Reported on 08/10/2024) 30 tablet 0   OVER THE COUNTER MEDICATION Take 2 tablets by mouth at bedtime. Restful monk sleep supplement (without melatonin)     No facility-administered medications prior to visit.     Allergies  Allergen Reactions   Atorvastatin Other (See Comments)    Muscle cramps severe      Simvastatin Other (See Comments)    Severe muscle cramps   Cephalosporins     'ran a fever   Nickel Rash    Sulfa Antibiotics Hives   Sulfasalazine Hives   Dilaudid  [Hydromorphone ] Itching and Rash   Eliquis [Apixaban] Hives and Itching   Xarelto  [Rivaroxaban ] Hives and Itching    Social History   Tobacco Use   Smoking status: Former    Current packs/day: 0.00    Average packs/day: 0.2 packs/day for 5.0 years (1.0 ttl pk-yrs)    Types: Cigarettes    Start date: 09/24/1975    Quit date: 09/23/1980    Years since quitting: 43.9   Smokeless tobacco: Never  Vaping Use   Vaping status: Never Used  Substance Use Topics   Alcohol  use: Yes    Alcohol /week: 14.0 standard drinks of alcohol     Types: 14 Glasses of wine per week   Drug use: No    Family History  Problem Relation Age of Onset   Hypertension Sister    Lung cancer Father    Hypertension Mother    Multiple sclerosis Daughter    Breast cancer Other        maternal aunt   Allergies Daughter    Allergies Daughter    Allergies Son        Objective   Objective:   Vitals:   08/10/24 1341  BP: (!) 149/78  Pulse: 64  Temp: 97.6 F (36.4 C)  TempSrc: Temporal  SpO2: 95%  Weight: 198 lb (89.8 kg)  Height: 5' 4 (1.626 m)   Body mass index is 33.99 kg/m.  Physical Exam  Physical Exam      Assessment & Plan:   Problem List Items Addressed This Visit   None   Assessment and Plan Assessment & Plan      No orders of the defined types were placed in this encounter.   No orders of the defined types were placed in this encounter.   No follow-ups on file.   Corean Fireman, MSN, NP-C [facility]  [email or web address] Pager: [phone] Office: [phone] [facility] Main Line: [phone] *Secure Chat Communication Welcome

## 2024-08-10 NOTE — Patient Instructions (Signed)
Continue off antibiotics.

## 2024-08-11 ENCOUNTER — Ambulatory Visit: Attending: Cardiology | Admitting: *Deleted

## 2024-08-11 DIAGNOSIS — I480 Paroxysmal atrial fibrillation: Secondary | ICD-10-CM | POA: Diagnosis not present

## 2024-08-11 DIAGNOSIS — Z7901 Long term (current) use of anticoagulants: Secondary | ICD-10-CM

## 2024-08-11 LAB — POCT INR: INR: 2.1 (ref 2.0–3.0)

## 2024-08-11 NOTE — Progress Notes (Signed)
 Description   INR-2.1; Continue taking warfarin 1 tablet daily except 1/2 tablet on Mondays. Recheck in 3 weeks. Anticoagulation Clinic [phone]

## 2024-08-11 NOTE — Patient Instructions (Signed)
 Description   INR-2.1; Continue taking warfarin 1 tablet daily except 1/2 tablet on Mondays. Recheck in 3 weeks. Anticoagulation Clinic [phone]

## 2024-08-12 NOTE — Progress Notes (Signed)
 [name] [name]                                          MRN: [id number]   08/12/2024   The [facility] Quality Team Specialist reviewed this patient medical record for the purposes of chart review for care gap closure. The following were reviewed: chart review for care gap closure-controlling blood pressure and kidney health evaluation for diabetes:eGFR  and uACR.    [facility] Quality Team

## 2024-08-16 DIAGNOSIS — Z0000 Encounter for general adult medical examination without abnormal findings: Secondary | ICD-10-CM | POA: Diagnosis not present

## 2024-08-16 DIAGNOSIS — E119 Type 2 diabetes mellitus without complications: Secondary | ICD-10-CM | POA: Diagnosis not present

## 2024-08-16 DIAGNOSIS — Z1339 Encounter for screening examination for other mental health and behavioral disorders: Secondary | ICD-10-CM | POA: Diagnosis not present

## 2024-08-16 DIAGNOSIS — R82998 Other abnormal findings in urine: Secondary | ICD-10-CM | POA: Diagnosis not present

## 2024-08-16 DIAGNOSIS — Z23 Encounter for immunization: Secondary | ICD-10-CM | POA: Diagnosis not present

## 2024-08-16 DIAGNOSIS — E1169 Type 2 diabetes mellitus with other specified complication: Secondary | ICD-10-CM | POA: Diagnosis not present

## 2024-08-16 DIAGNOSIS — I10 Essential (primary) hypertension: Secondary | ICD-10-CM | POA: Diagnosis not present

## 2024-08-16 DIAGNOSIS — Z1331 Encounter for screening for depression: Secondary | ICD-10-CM | POA: Diagnosis not present

## 2024-08-17 DIAGNOSIS — Z1231 Encounter for screening mammogram for malignant neoplasm of breast: Secondary | ICD-10-CM | POA: Diagnosis not present

## 2024-08-27 ENCOUNTER — Encounter (HOSPITAL_COMMUNITY): Payer: Self-pay | Admitting: Neurological Surgery

## 2024-09-01 ENCOUNTER — Ambulatory Visit: Attending: Cardiology

## 2024-09-01 DIAGNOSIS — I480 Paroxysmal atrial fibrillation: Secondary | ICD-10-CM | POA: Diagnosis not present

## 2024-09-01 DIAGNOSIS — Z7901 Long term (current) use of anticoagulants: Secondary | ICD-10-CM | POA: Diagnosis not present

## 2024-09-01 LAB — POCT INR: INR: 2.2 (ref 2.0–3.0)

## 2024-09-01 NOTE — Patient Instructions (Signed)
 Description   INR-2.2; Continue taking warfarin 1 tablet daily except 1/2 tablet on Mondays. Recheck in 4 weeks. Anticoagulation Clinic [phone]

## 2024-09-01 NOTE — Progress Notes (Signed)
 Description   INR-2.2; Continue taking warfarin 1 tablet daily except 1/2 tablet on Mondays. Recheck in 4 weeks. Anticoagulation Clinic [phone]

## 2024-09-02 ENCOUNTER — Encounter: Payer: Self-pay | Admitting: Cardiology

## 2024-09-06 ENCOUNTER — Encounter: Payer: Self-pay | Admitting: Infectious Disease

## 2024-09-07 ENCOUNTER — Ambulatory Visit: Admitting: Infectious Disease

## 2024-09-07 ENCOUNTER — Other Ambulatory Visit: Payer: Self-pay

## 2024-09-07 ENCOUNTER — Encounter: Payer: Self-pay | Admitting: Infectious Disease

## 2024-09-07 VITALS — BP 150/77 | HR 65 | Temp 97.7°F

## 2024-09-07 DIAGNOSIS — Z9889 Other specified postprocedural states: Secondary | ICD-10-CM

## 2024-09-07 DIAGNOSIS — M353 Polymyalgia rheumatica: Secondary | ICD-10-CM

## 2024-09-07 DIAGNOSIS — M869 Osteomyelitis, unspecified: Secondary | ICD-10-CM | POA: Diagnosis not present

## 2024-09-07 DIAGNOSIS — J321 Chronic frontal sinusitis: Secondary | ICD-10-CM | POA: Diagnosis not present

## 2024-09-07 NOTE — Progress Notes (Signed)
 Subjective:    Patient ID: [name] [name], female    DOB: Jan 08, 1946, 78 y.o.   MRN: [id number]  HPI  Discussed the use of AI scribe software for clinical note transcription with the patient, who gave verbal consent to proceed.  History of Present Illness   [name] [name] is an 78 year old female who presents for follow-up after right frontal cranioplasty.  She was diagnosed with polymyalgia rheumatica in 2016 and underwent a craniotomy in May 2025 for the removal of a meningioma. Post-operatively, MRI showed sinonasal disease, and a sinus CT revealed extensive opacification and obstruction, raising concerns for osteomyelitis of the bone flap with communication to the right frontal sinus.  In August 2025, she underwent another right frontal craniotomy with exoneration of the right frontal sinus and coverage with a pericranial graft. She experienced pain before the original flap placement, particularly where the screws were located. She previously had symptoms of green sinus discharge, which had resolved by August 10, 2024.  Cultures taken while on Augmentin  did not yield any growth. Her Augmentin  dosage was increased to TID for 42 days. She underwent repeat surgery on July 28, 2024, with right frontal cranioplasty to repair the cranial defect. Post-operatively, she completed her antibiotics by July 30, 2024, and has been off antibiotics since then.  She has no more significant drainage, only occasional minor green discharge, which is much less than before. She has been off antibiotics for over a month.   She is frustrated that Dr. Carlie will not do any sinus surgeries on her. She did see physicians at [facility] and was scheduled for surgery there but it was cancelled twice.       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   PMR (polymyalgia rheumatica) 03/15/2015   Polyarthritis 03/03/2015   Polymyalgia 03/03/2015   Sinusitis 06/14/2024   Sleep apnea    Pt has Inspire Device    Past Surgical History:  Procedure Laterality Date   ABDOMINAL HYSTERECTOMY  1973   APPENDECTOMY  09/24/1971   APPLICATION OF CRANIAL NAVIGATION Right 02/05/2023   Procedure: APPLICATION OF CRANIAL NAVIGATION;  Surgeon: Joshua Alm RAMAN, MD;  Location: [facility] OR;  Service: Neurosurgery;  Laterality: Right;   CHOLECYSTECTOMY N/A 10/08/2022   Procedure: LAPAROSCOPIC CHOLECYSTECTOMY;  Surgeon: Belinda Cough, MD;  Location: [facility] OR;  Service: General;  Laterality: N/A;   CRANIOPLASTY Right 07/28/2024   Procedure: YOLONDA;  Surgeon: Joshua Alm Hamilton, MD;  Location: [facility] OR;  Service: Neurosurgery;  Laterality: Right;  Cranioplasty, right frontal   CRANIOTOMY Right 02/05/2023   Procedure: Craniotomy - right for meningioma - Frontal;  Surgeon: Joshua Alm RAMAN, MD;  Location: [facility] OR;  Service: Neurosurgery;  Laterality: Right;   CRANIOTOMY Right 05/04/2024   Procedure: REMOVAL OF CRANIOTOMY BONE FLAP AND ABDOMINAL FAT GRAFT;  Surgeon: Joshua Alm Hamilton, MD;  Location: [facility] OR;  Service: Neurosurgery;  Laterality: Right;  Right  frontal craniectomy for infected bone flap   DRUG INDUCED ENDOSCOPY N/A 11/14/2021   Procedure: DRUG INDUCED SLEEP ENDOSCOPY;  Surgeon: Carlie Clark, MD;  Location: [facility];  Service: ENT;  Laterality: N/A;   FOOT SURGERY Left 09/24/2007   IMPLANTATION OF HYPOGLOSSAL NERVE STIMULATOR Right 12/18/2021   Procedure: IMPLANTATION OF HYPOGLOSSAL NERVE STIMULATOR;  Surgeon: Carlie Clark, MD;  Location:  [facility];  Service: ENT;  Laterality: Right;   INTRAOPERATIVE CHOLANGIOGRAM N/A 10/08/2022   Procedure: INTRAOPERATIVE CHOLANGIOGRAM;  Surgeon: Belinda Cough, MD;  Location: [facility] OR;  Service: General;  Laterality: N/A;   KNEE ARTHROSCOPY  09/23/2005   right   OOPHORECTOMY Bilateral 2001   TUBAL LIGATION  09/23/1970    Family History  Problem Relation Age of Onset   Hypertension Sister    Lung cancer Father    Hypertension Mother    Multiple sclerosis Daughter    Breast cancer Other        maternal aunt   Allergies Daughter    Allergies Daughter    Allergies Son       Social History   Socioeconomic History   Marital status: Married    Spouse name: Not on file   Number of children: 3   Years of education: Not on file   Highest education level: Not on file  Occupational History   Occupation: RN    Employer: RETIRED  Tobacco Use   Smoking status: Former    Current packs/day: 0.00    Average packs/day: 0.2 packs/day for 5.0 years (1.0 ttl pk-yrs)    Types: Cigarettes    Start date: 09/24/1975    Quit date: 09/23/1980    Years since quitting: 43.9   Smokeless tobacco: Never  Vaping Use   Vaping status: Never Used  Substance and Sexual Activity   Alcohol  use: Yes    Alcohol /week: 14.0 standard drinks of alcohol     Types: 14 Glasses of wine per week   Drug use: No   Sexual activity: Not Currently    Birth control/protection: Surgical  Other Topics Concern   Not on file  Social History Narrative   Not on file   Social Drivers of Health   Tobacco Use: Medium Risk (09/07/2024)   Patient History    Smoking Tobacco Use: Former    Smokeless Tobacco Use: Never    Passive Exposure: Not on Actuary Strain: Not on file  Food Insecurity: No Food Insecurity (05/04/2024)   Epic    Worried About Radiation Protection Practitioner of Food in the Last Year: Never true    [employer] of Food in the Last Year: Never true  Transportation Needs: No Transportation Needs  (05/04/2024)   Epic    Lack of Transportation (Medical): No    Lack of Transportation (Non-Medical): No  Physical Activity: Not on file  Stress: Not on file  Social Connections: Socially Integrated (05/04/2024)   Social Connection and Isolation Panel    Frequency of Communication with Friends and Family: More than three times a week    Frequency of Social Gatherings with Friends and Family: More than three times a week    Attends Religious Services: More than 4 times per year    Active Member of Clubs or Organizations: Yes    Attends Banker Meetings: More than 4 times per year    Marital Status: Married  Depression (EYV7-0): Not on file  Alcohol  Screen: Not on file  Housing: Low Risk (05/04/2024)  Epic    Unable to Pay for Housing in the Last Year: No    Number of Times Moved in the Last Year: 0    Homeless in the Last Year: No  Utilities: Not At Risk (05/04/2024)   Epic    Threatened with loss of utilities: No  Health Literacy: Not on file    Allergies[1]  Current Medications[2]   Review of Systems  Constitutional:  Negative for activity change, appetite change, chills, diaphoresis, fatigue, fever and unexpected weight change.  HENT:  Negative for congestion, rhinorrhea, sinus pressure, sneezing, sore throat and trouble swallowing.   Eyes:  Negative for photophobia and visual disturbance.  Respiratory:  Negative for cough, chest tightness, shortness of breath, wheezing and stridor.   Cardiovascular:  Negative for chest pain, palpitations and leg swelling.  Gastrointestinal:  Negative for abdominal distention, abdominal pain, anal bleeding, blood in stool, constipation, diarrhea, nausea and vomiting.  Genitourinary:  Negative for difficulty urinating, dysuria, flank pain and hematuria.  Musculoskeletal:  Negative for arthralgias, back pain, gait problem, joint swelling and myalgias.  Skin:  Negative for color change, pallor, rash and wound.  Neurological:   Negative for dizziness, tremors, weakness and light-headedness.  Hematological:  Negative for adenopathy. Does not bruise/bleed easily.  Psychiatric/Behavioral:  Negative for agitation, behavioral problems, confusion, decreased concentration, dysphoric mood and sleep disturbance.        Objective:   Physical Exam Constitutional:      General: She is not in acute distress.    Appearance: She is well-developed. She is not diaphoretic.  HENT:     Head: Normocephalic and atraumatic.     Comments: Clean surgical scar    Mouth/Throat:     Pharynx: No oropharyngeal exudate.  Eyes:     General: No scleral icterus.    Conjunctiva/sclera: Conjunctivae normal.  Cardiovascular:     Rate and Rhythm: Normal rate and regular rhythm.  Pulmonary:     Effort: Pulmonary effort is normal. No respiratory distress.     Breath sounds: No wheezing.  Abdominal:     General: There is no distension.  Musculoskeletal:        General: No tenderness.     Cervical back: Normal range of motion and neck supple.  Skin:    General: Skin is warm and dry.     Coloration: Skin is not pale.     Findings: No erythema or rash.  Neurological:     General: No focal deficit present.     Mental Status: She is alert and oriented to person, place, and time.     Motor: No abnormal muscle tone.     Coordination: Coordination normal.  Psychiatric:        Mood and Affect: Mood normal.        Behavior: Behavior normal.        Thought Content: Thought content normal.        Judgment: Judgment normal.           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 Completed 42 days of Augmentin . No significant drainage post-treatment. Minor discharge not concerning. No further surgeries planned. Discussed recurrence risk and symptom monitoring. - Follow-up in three months to monitor for recurrence. - Cancel  follow-up if asymptomatic before scheduled date.   PMR: will followup with Dr. Mai and has been on prednisone    SInus disease: there does not seem  to be any urgent need for surgical evaluation.           [1]  Allergies Allergen Reactions   Atorvastatin Other (See Comments)    Muscle cramps severe      Simvastatin Other (See Comments)    Severe muscle cramps   Cephalosporins     'ran a fever   Nickel Rash   Sulfa Antibiotics Hives   Sulfasalazine Hives   Dilaudid  [Hydromorphone ] Itching and Rash   Eliquis [Apixaban] Hives and Itching   Xarelto  [Rivaroxaban ] Hives and Itching  [2]  Current Outpatient Medications:    albuterol  (PROAIR  HFA) 108 (90 Base) MCG/ACT inhaler, Inhale 2 puffs into the lungs every 6 (six) hours as needed., Disp: 1 Inhaler, Rfl: 5   amoxicillin -clavulanate (AUGMENTIN ) 875-125 MG tablet, Take one tablet every 8 hours, Disp: 126 tablet, Rfl: 0   b complex vitamins capsule, Take 1 capsule by mouth daily., Disp: , Rfl:    chlorthalidone  (HYGROTON ) 25 MG tablet, Take 1 tablet (25 mg total) by mouth daily., Disp: 90 tablet, Rfl: 1   colesevelam  (WELCHOL ) 625 MG tablet, Take 625 mg by mouth daily., Disp: , Rfl:    diphenhydrAMINE  (BENADRYL ) 25 MG tablet, Take 25 mg by mouth at bedtime., Disp: , Rfl:    doxylamine , Sleep, (UNISOM ) 25 MG tablet, Take 25 mg by mouth at bedtime., Disp: , Rfl:    ezetimibe  (ZETIA ) 10 MG tablet, TAKE 1 TABLET BY MOUTH EVERY DAY, Disp: 90 tablet, Rfl: 3   fluticasone  (FLONASE ) 50 MCG/ACT nasal spray, Place 2 sprays into both nostrils daily., Disp: , Rfl:    HYDROcodone -acetaminophen  (NORCO/VICODIN) 5-325 MG tablet, Take 1 tablet by mouth every 4 (four) hours as needed for moderate pain (pain score 4-6)., Disp: 30 tablet, Rfl: 0   irbesartan  (AVAPRO ) 300 MG tablet, Take 300 mg by mouth daily., Disp: , Rfl:    Magnesium  Oxide -Mg Supplement 200 MG TABS, Take 400 mg by mouth at bedtime., Disp: , Rfl:    montelukast  (SINGULAIR ) 10  MG tablet, Take 10 mg by mouth at bedtime., Disp: , Rfl:    Multiple Vitamin (MULTI-VITAMINS) TABS, Take 1 tablet by mouth daily. , Disp: , Rfl:    Multiple Vitamins-Minerals (OCUVITE EYE HEALTH FORMULA) CAPS, Take 1 tablet by mouth daily., Disp: , Rfl:    omeprazole (PRILOSEC) 20 MG capsule, Take 20 mg by mouth daily., Disp: , Rfl:    OVER THE COUNTER MEDICATION, Take 2 tablets by mouth at bedtime. Restful monk sleep supplement (without melatonin), Disp: , Rfl:    predniSONE  (DELTASONE ) 5 MG tablet, Take 5 mg by mouth daily with breakfast., Disp: , Rfl:    REPATHA SURECLICK 140 MG/ML SOAJ, Inject 140 mg into the skin every 14 (fourteen) days., Disp: , Rfl:    sotalol  (BETAPACE ) 80 MG tablet, Take 80 mg by mouth 2 (two) times daily., Disp: , Rfl:    traZODone  (DESYREL ) 50 MG tablet, Take 50 mg by mouth at bedtime., Disp: , Rfl:    TRYPTOPHAN  PO, Take 1 capsule by mouth at bedtime., Disp: , Rfl:    venlafaxine  XR (EFFEXOR -XR) 150 MG 24 hr capsule, Take 150 mg by mouth daily., Disp: , Rfl:    Vitamin D, Ergocalciferol, (DRISDOL) 1.25 MG (50000 UNIT) CAPS capsule, Take 50,000 Units by mouth every Monday., Disp: , Rfl:    warfarin (COUMADIN ) 5 MG tablet, TAKE 1 TABLET BY MOUTH IN THE AFTERNOON OR AS DIRECTED BY COUMADIN  CLINIC (Patient taking differently: Take 2.5-5 mg by  mouth See admin instructions. Take 2.5 mg by mouth on Monday and take 5 mg Tuesday-Sunday), Disp: 100 tablet, Rfl: 1

## 2024-09-09 ENCOUNTER — Ambulatory Visit (HOSPITAL_BASED_OUTPATIENT_CLINIC_OR_DEPARTMENT_OTHER): Admitting: Pulmonary Disease

## 2024-09-09 VITALS — BP 177/68 | Temp 97.7°F | Ht 64.0 in | Wt 208.5 lb

## 2024-09-09 DIAGNOSIS — Z9682 Presence of neurostimulator: Secondary | ICD-10-CM

## 2024-09-09 DIAGNOSIS — G4733 Obstructive sleep apnea (adult) (pediatric): Secondary | ICD-10-CM

## 2024-09-09 NOTE — Patient Instructions (Signed)
 Keep at 0.9 V

## 2024-09-09 NOTE — Progress Notes (Signed)
 Subjective:    Patient ID: [name] [name], female    DOB: [birth year], 78 y.o.   MRN: [id number]   78  yo  retired designer, fashion/clothing for FU of OSA and mild intermittent asthma   PMH-   paroxysmal atrial fibrillation - on sotalol  80 mg twice daily and Xarelto .  Polymyalgia rheumatica was diagnosed in 2016, good response to steroids Meningioma  04/2024 right frontal craniotomy with exoneration of the right frontal sinus and coverage with a pericranial graft    She was intolerant of CPAP and underwent placement of hypoglossal nerve stimulator implant on 12/18/2021    Activation :Sensation was at 0.5 V.   Post titration : We reset her remote to 1.2 V to a range of 1.0-1.4 She has 1 hour start delay, pause time of 15 minutes and a duration of 10 hours     11/2023  Tongue stimulation was normal at 1.2 V but she seems to be very sensitive to this level of stimulation. Previously she would tolerate this level.  hypersensitivity to the same therapeutic level due to ? intercurrent respiratory infection    We lowered her amplitude to 0.9 V.     Discussed the use of AI scribe software for clinical note transcription with the patient, who gave verbal consent to proceed.  History of Present Illness [name] [name] is an 78 year old female with a history of brain tumor and frontal sinus infection who presents for follow-up after recent cranial surgeries.  She had prior brain tumor resection complicated by frontal sinus infection, requiring removal of infected bone in August and placement of a prosthetic bone in November. She feels well after these surgeries without new complaints relevant to breathing or sinus symptoms today.  She has myoclonic jerks during sleep, mainly shoulder movements before sleep onset. A sleep study six months ago recorded about eight events, consistent with mild sleep disturbance.  Her current respiratory and sleep-related medications are Flonase , Singulair , and  prednisone  5 mg daily for polymyalgia. Prednisone  use contributes to immunocompromised status.  She uses a sleep device set with a 60 minute start delay and a total run time of nine hours. She finds these settings comfortable and no longer wakes in the middle of the night from the device. She has recovered well from her craniotomy.  Reviewed ID note osteomyelitis seems to have resolved. She increased transiently to 1.0 V and is back down to 0.9 V. We reviewed home sleep study results    Significant tests/ events reviewed inspire titration study 04/24/22 >>  Incoming amplitude was 1.3 V , good control of events was noted at 1.2 and 1.3 V and there was over-activation when increased to 1.5 V   08/2022 HST >> AHI of 8/hour, events predominantly in supine sleep, low saturation of 88% On inspire 1.2 v  HST 03/2024 on 0.9 V, residual AHI 8/h, low sat 85% for good luck with the surgery alright hopefully it is not too painful   HST 05/2017 AHI 32/h    Review of Systems  neg for any significant sore throat, dysphagia, itching, sneezing, nasal congestion or excess/ purulent secretions, fever, chills, sweats, unintended wt loss, pleuritic or exertional cp, hempoptysis, orthopnea pnd or change in chronic leg swelling. Also denies presyncope, palpitations, heartburn, abdominal pain, nausea, vomiting, diarrhea or change in bowel or urinary habits, dysuria,hematuria, rash, arthralgias, visual complaints, headache, numbness weakness or ataxia.      Objective:   Physical Exam  Gen. 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 She reports feeling well post-procedure and is satisfied with the outcome. The surgical intervention addressed the infection and sinus communication, with successful management by specialists at [facility].  Polymyalgia rheumatica Polymyalgia rheumatica is managed with prednisone  5 mg, with consideration of immunocompromise due to steroid therapy. - Continue prednisone  5 mg.  Myoclonic jerks She experiences myoclonic jerks during sleep, characterized as involuntary muscle twitches. These are common and not a significant concern. She was reassured of their normalcy.  Allergic rhinitis Allergic rhinitis is managed with Flonase  and Singulair , prescribed by Doctor Margarite. - Continue Flonase  and Singulair  as prescribed.  OSA status post hypoglossal nerve stimulator device Keep inspire settings at 0.9 V download reviewed shows excellent compliance minimal pauses

## 2024-09-20 NOTE — Telephone Encounter (Signed)
 I called and spoke with the pt  She has already had the surgery mentioned below completed  Nothing further needed

## 2024-09-29 ENCOUNTER — Ambulatory Visit

## 2024-09-29 DIAGNOSIS — I480 Paroxysmal atrial fibrillation: Secondary | ICD-10-CM

## 2024-09-29 DIAGNOSIS — Z7901 Long term (current) use of anticoagulants: Secondary | ICD-10-CM | POA: Diagnosis not present

## 2024-09-29 LAB — POCT INR: INR: 3.7 — AB (ref 2.0–3.0)

## 2024-09-29 NOTE — Progress Notes (Signed)
 Description   INR-3.7; Hold dose today and then continue taking warfarin 1 tablet daily except 1/2 tablet on Mondays. Recheck in 2 weeks. Anticoagulation Clinic [phone]

## 2024-09-29 NOTE — Patient Instructions (Signed)
 Description   INR-3.7; Hold dose today and then continue taking warfarin 1 tablet daily except 1/2 tablet on Mondays. Recheck in 2 weeks. Anticoagulation Clinic [phone]

## 2024-10-11 NOTE — Progress Notes (Unsigned)
 " Electrophysiology Office Note:   Date:  10/13/2024  ID:  [name] [name], DOB Jul 13, 1946, MRN [id number]  Primary Cardiologist: Peter Jordan, MD Electrophysiologist: Fonda Kitty, MD      History of Present Illness:   [name] [name] is a 79 y.o. female with h/o atrial fibrillation, meningioma post resection, polymyalgia, sleep apnea, GERD who is being seen today for Watchman evaluation.  Discussed the use of AI scribe software for clinical note transcription with the patient, who gave verbal consent to proceed.  History of Present Illness [name] [name] is an 79 year old female with atrial fibrillation who presents for consideration of a Watchman procedure.  She has a history of atrial fibrillation and has been on sotalol  with fairly good results. Recently, she experienced five or six episodes with heart rates reaching 155-160 bpm, one of which led to an ER visit due to low blood pressure and an inability to lower her heart rate. The episodes last between three and a half to five hours and are characterized by 'flip flopping, fluttering' sensations.  She has never undergone an ablation for atrial fibrillation and is currently not interested in discontinuing sotalol  if it means being in atrial fibrillation. She has an Building control surveyor. Her recent episodes have not been frequent, with several months between them, and she does not recall having any recent atrial beats or atrial fibrillation.   Review of systems complete and found to be negative unless listed in HPI.   EP Information / Studies Reviewed:    EKG is ordered today. Personal review as below.  EKG Interpretation Date/Time:  Tuesday October 12 2024 11:00:46 EST Ventricular Rate:  59 PR Interval:  158 QRS Duration:  84 QT Interval:  476 QTC Calculation: 471 R Axis:   56  Text Interpretation: Sinus bradycardia When compared with ECG of 20-May-2024 17:34, Rate slower Confirmed by Kitty Fonda [id number]) on 10/12/2024 11:11:26  AM   Echo 03/16/24:   1. Left ventricular ejection fraction, by estimation, is 60 to 65%. The  left ventricle has normal function. The left ventricle has no regional  wall motion abnormalities. Left ventricular diastolic parameters are  consistent with Grade I diastolic  dysfunction (impaired relaxation).   2. Right ventricular systolic function is normal. The right ventricular  size is normal.   3. Left atrial size was mildly dilated.   4. Right atrial size was mildly dilated.   5. The mitral valve is abnormal. Mild to moderate mitral valve  regurgitation. No evidence of mitral stenosis. There is mild late systolic  prolapse of both leaflets of the mitral valve.   6. The aortic valve is tricuspid. There is mild calcification of the  aortic valve. Aortic valve regurgitation is moderate. No aortic stenosis  is present.   7. The inferior vena cava is normal in size with greater than 50%  respiratory variability, suggesting right atrial pressure of 3 mmHg.   Watchman CT 02/23/24: IMPRESSION: 1. There is no thrombus in the left atrial appendage. LAA landing zone measures 22mm x 20mm in diameter with depth 15mm, appropriate for a 27mm FLX device   2. Coronary calcium score 99 (51st percentile). 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 The patient's score is based upon: CHF History:  0 HTN History: 1 Diabetes History: 0 Stroke History: 0 Vascular Disease History: 0 Age Score: 2 Gender Score: 1             Physical Exam:   VS:  BP 136/84   Pulse (!) 59   Ht 5' 4 (1.626 m)   Wt 209 lb (94.8 kg)   SpO2 98%   BMI 35.87 kg/m    Wt Readings from Last 3 Encounters:  10/12/24 209 lb (94.8 kg)  09/09/24 208 lb 8 oz (94.6 kg)   08/10/24 198 lb (89.8 kg)     General: Well developed, in no acute distress.  Neck: No JVD.  Cardiac: Bradycardic, regular rhythm.  Resp: Normal work of breathing.  Ext: No edema.  Neuro: No gross focal deficits.  Psych: Normal affect.    ASSESSMENT AND PLAN:   I have seen [name] [name] in the office today who is being considered for a Watchman left atrial appendage closure device. I believe they will benefit from this procedure given their history of atrial fibrillation, CHA2DS2-VASc score of 4 and unadjusted ischemic stroke rate of 4.8% per year. Unfortunately, the patient is not felt to be a long term anticoagulation candidate secondary to history of meningioma and intolerance to multiple oral anti-coagulants. The patient's chart has been reviewed and I feel that they would be a candidate for short term oral anticoagulation after Watchman implant.   It is my belief that after undergoing a LAA closure procedure, [name] [name] will not need long term anticoagulation which eliminates anticoagulation side effects and major bleeding risk.   Procedural risks for the Watchman implant have been reviewed with the patient including a 0.5% risk of stroke, <1% risk of perforation and <1% risk of device embolization. Other risks include bleeding, vascular damage, tamponade, worsening renal function, and death. The patient understands these risk and wishes to proceed.     The published clinical data on the safety and effectiveness of WATCHMAN include but are not limited to the following: - Holmes DR, Jess BEARD, Sick P et al. for the PROTECT AF Investigators. Percutaneous closure of the left atrial appendage versus warfarin therapy for prevention of stroke in patients with atrial fibrillation: a randomised non-inferiority trial. Lancet 2009; 374: 534-42. GLENWOOD Jess BEARD, Doshi SK, Jonita VEAR Satchel D et al. on behalf of the PROTECT AF Investigators. Percutaneous Left Atrial Appendage Closure for Stroke  Prophylaxis in Patients With Atrial Fibrillation 2.3-Year Follow-up of the PROTECT AF (Watchman Left Atrial Appendage System for Embolic Protection in Patients With Atrial Fibrillation) Trial. Circulation 2013; 127:720-729. - Alli O, Doshi S,  Kar S, Reddy VY, Sievert H et al. Quality of Life Assessment in the Randomized PROTECT AF (Percutaneous Closure of the Left Atrial Appendage Versus Warfarin Therapy for Prevention of Stroke in Patients With Atrial Fibrillation) Trial of Patients at Risk for Stroke With Nonvalvular Atrial Fibrillation. J Am Coll Cardiol 2013; 61:1790-8. GLENWOOD Satchel DR, Archer RAMAN, Price M, Whisenant B, Sievert H, Doshi S, Huber K, Reddy V. Prospective randomized evaluation of the Watchman left atrial appendage Device in patients with atrial fibrillation versus long-term warfarin therapy; the PREVAIL trial. Journal of the [employer] of Cardiology, Vol. 4, No. 1, 2014, 1-11. - Kar S, Doshi SK, Sadhu A, Horton R, Osorio J et al. Primary outcome evaluation of a next-generation left atrial appendage closure device: results from the PINNACLE FLX trial. Circulation 2021;143(18)1754-1762.   HAS-BLED score 3 Hypertension Yes  Abnormal renal and liver function (Dialysis,  transplant, Cr >2.26 mg/dL /Cirrhosis or Bilirubin >2x Normal or AST/ALT/AP >3x Normal) No  Stroke No  Bleeding No  Labile INR (Unstable/high INR) Yes  Elderly (>65) Yes  Drugs or alcohol  (>= 8 drinks/week, anti-plt or NSAID) No   CHA2DS2-VASc Score = 4  The patient's score is based upon: CHF History: 0 HTN History: 1 Diabetes History: 0 Stroke History: 0 Vascular Disease History: 0 Age Score: 2 Gender Score: 1       ASSESSMENT AND PLAN: After today's visit with the patient which was dedicated solely for shared decision making visit regarding LAA closure device, the patient decided to proceed with the LAA appendage closure procedure scheduled to be done in the near future at [facility]. Prior to the  procedure, I would like to obtain a gated CT scan of the chest with contrast timed for PV/LA visualization. Additionally, patient has history of PAF and continues to have episodes despite treatment with sotalol . She is symptomatic with these episodes. For this reason, I think it would be appropriate to perform AF ablation concomitantly with Watchman implant.   #Paroxysmal atrial fibrillation: Symptomatic. Continues to have episodes despite sotalol . #High risk med monitoring: Sotalol . Qtc stable.  #Hypercoagulable state due to AF: -Continue sotalol  as bridge to ablation.  -Continue warfarin.   Follow up with EP Team as usual post procedure  Signed, Fonda Kitty, MD  "

## 2024-10-12 ENCOUNTER — Telehealth: Payer: Self-pay

## 2024-10-12 ENCOUNTER — Encounter: Payer: Self-pay | Admitting: Cardiology

## 2024-10-12 ENCOUNTER — Ambulatory Visit: Attending: Cardiology | Admitting: Cardiology

## 2024-10-12 ENCOUNTER — Ambulatory Visit: Admitting: *Deleted

## 2024-10-12 VITALS — BP 136/84 | HR 59 | Ht 64.0 in | Wt 209.0 lb

## 2024-10-12 DIAGNOSIS — Z79899 Other long term (current) drug therapy: Secondary | ICD-10-CM

## 2024-10-12 DIAGNOSIS — D6869 Other thrombophilia: Secondary | ICD-10-CM

## 2024-10-12 DIAGNOSIS — E782 Mixed hyperlipidemia: Secondary | ICD-10-CM

## 2024-10-12 DIAGNOSIS — I480 Paroxysmal atrial fibrillation: Secondary | ICD-10-CM

## 2024-10-12 DIAGNOSIS — I10 Essential (primary) hypertension: Secondary | ICD-10-CM

## 2024-10-12 DIAGNOSIS — Z7901 Long term (current) use of anticoagulants: Secondary | ICD-10-CM

## 2024-10-12 DIAGNOSIS — I493 Ventricular premature depolarization: Secondary | ICD-10-CM

## 2024-10-12 DIAGNOSIS — I471 Supraventricular tachycardia, unspecified: Secondary | ICD-10-CM

## 2024-10-12 DIAGNOSIS — I4891 Unspecified atrial fibrillation: Secondary | ICD-10-CM

## 2024-10-12 LAB — POCT INR: INR: 2.8 (ref 2.0–3.0)

## 2024-10-12 NOTE — Patient Instructions (Signed)
 Medication Instructions:  Your physician recommends that you continue on your current medications as directed. Please refer to the Current Medication list given to you today.  *If you need a refill on your cardiac medications before your next appointment, please call your pharmacy*  Lab Work: TODAY: BMET  Testing/Procedures: Cardiac CT Your physician has requested that you have cardiac CT. Cardiac computed tomography (CT) is a painless test that uses an x-ray machine to take clear, detailed pictures of your heart. For further information please visit [email or web address] Please follow instruction sheet as given. You will be called to schedule this test.   Watchman  Your physician has requested that you have Left atrial appendage (LAA) closure device implantation is a procedure to put a small device in the LAA of the heart. The LAA is a small sac in the wall of the heart's left upper chamber. Blood clots can form in this area. The device, Watchman closes the LAA to help prevent a blood clot and stroke.  Ablation  Your physician has recommended that you have an ablation. Catheter ablation is a medical procedure used to treat some cardiac arrhythmias (irregular heartbeats). During catheter ablation, a long, thin, flexible tube is put into a blood vessel in your groin (upper thigh), or neck. This tube is called an ablation catheter. It is then guided to your heart through the blood vessel. Radio frequency waves destroy small areas of heart tissue where abnormal heartbeats may cause an arrhythmia to start.    You will be contacted by Nurse Navigator, Danielle to schedule your pre-procedure visit and procedure date. If you have any questions she can be reached at [phone].   Follow-Up: At [facility], you and your health needs are our priority.  As part of our continuing mission to provide you with exceptional heart care, our providers are all part of one team.  This team includes your  primary Cardiologist (physician) and Advanced Practice Providers or APPs (Physician Assistants and Nurse Practitioners) who all work together to provide you with the care you need, when you need it.

## 2024-10-12 NOTE — Telephone Encounter (Signed)
 Per Dr. Kennyth, patient does not need repeat CT. She is okay to schedule concomitant PVI/LAAO.

## 2024-10-12 NOTE — Patient Instructions (Signed)
 Description   INR-2.8; Continue taking warfarin 1 tablet daily except 1/2 tablet on Mondays. Recheck in 3 weeks. Anticoagulation Clinic [phone] Procedure Fax [phone]

## 2024-10-12 NOTE — Progress Notes (Signed)
 Description   INR-2.8; Continue taking warfarin 1 tablet daily except 1/2 tablet on Mondays. Recheck in 3 weeks. Anticoagulation Clinic [phone] Procedure Fax [phone]

## 2024-10-13 ENCOUNTER — Other Ambulatory Visit: Payer: Self-pay

## 2024-10-13 DIAGNOSIS — I480 Paroxysmal atrial fibrillation: Secondary | ICD-10-CM

## 2024-10-13 LAB — BASIC METABOLIC PANEL WITH GFR
BUN/Creatinine Ratio: 15 (ref 12–28)
BUN: 16 mg/dL (ref 8–27)
CO2: 25 mmol/L (ref 20–29)
Calcium: 9.3 mg/dL (ref 8.7–10.3)
Chloride: 91 mmol/L — ABNORMAL LOW (ref 96–106)
Creatinine, Ser: 1.04 mg/dL — ABNORMAL HIGH (ref 0.57–1.00)
Glucose: 126 mg/dL — ABNORMAL HIGH (ref 70–99)
Potassium: 3.5 mmol/L (ref 3.5–5.2)
Sodium: 133 mmol/L — ABNORMAL LOW (ref 134–144)
eGFR: 54 mL/min/1.73 — ABNORMAL LOW

## 2024-10-13 NOTE — Telephone Encounter (Signed)
 Patient returned call. Arranged concomitant ablation/watchman 12/10/24 with Dr. Kennyth as second case (7:30 AM report time). She verbalized understanding. Arranged pre-procedural OV with Jodie Passey 11/16/2024.  She is on coumadin  and will require 4 weekly therapeutic INR checks prior to concomitant ablation/watchman. Will route to clinic to manage.   Patient grateful for call.

## 2024-10-13 NOTE — Telephone Encounter (Signed)
 Left voicemail for patient to return call to arrange concomitant procedure.

## 2024-10-14 ENCOUNTER — Telehealth: Payer: Self-pay

## 2024-10-14 NOTE — Telephone Encounter (Signed)
 SABRA

## 2024-10-17 ENCOUNTER — Ambulatory Visit: Payer: Self-pay | Admitting: Cardiology

## 2024-11-02 ENCOUNTER — Ambulatory Visit

## 2024-11-16 ENCOUNTER — Ambulatory Visit: Admitting: Student

## 2024-12-06 ENCOUNTER — Ambulatory Visit: Payer: Self-pay | Admitting: Infectious Disease

## 2024-12-10 ENCOUNTER — Inpatient Hospital Stay (HOSPITAL_COMMUNITY): Admit: 2024-12-10 | Admitting: Cardiology

## 2024-12-10 ENCOUNTER — Encounter (HOSPITAL_COMMUNITY): Payer: Self-pay

## 2024-12-10 SURGERY — ATRIAL FIBRILLATION ABLATION
Anesthesia: General

## 2025-01-10 ENCOUNTER — Ambulatory Visit (HOSPITAL_COMMUNITY): Admitting: Physician Assistant

## 2025-03-21 ENCOUNTER — Encounter (HOSPITAL_BASED_OUTPATIENT_CLINIC_OR_DEPARTMENT_OTHER): Admitting: Pulmonary Disease
